# Patient Record
Sex: Male | Born: 1940 | Race: White | Hispanic: No | Marital: Married | Smoking: Former smoker
Health system: Southern US, Community
[De-identification: ages and names within clinical notes are randomized; demographics above are authoritative.]

## PROBLEM LIST (undated history)

## (undated) DIAGNOSIS — M069 Rheumatoid arthritis, unspecified: Secondary | ICD-10-CM

## (undated) DIAGNOSIS — I10 Essential (primary) hypertension: Secondary | ICD-10-CM

## (undated) DIAGNOSIS — E785 Hyperlipidemia, unspecified: Secondary | ICD-10-CM

## (undated) DIAGNOSIS — D649 Anemia, unspecified: Secondary | ICD-10-CM

## (undated) DIAGNOSIS — F419 Anxiety disorder, unspecified: Secondary | ICD-10-CM

## (undated) DIAGNOSIS — E039 Hypothyroidism, unspecified: Secondary | ICD-10-CM

## (undated) HISTORY — PX: CHOLECYSTECTOMY: SHX55

## (undated) HISTORY — PX: OTHER SURGICAL HISTORY: SHX169

---

## 2021-04-12 ENCOUNTER — Other Ambulatory Visit
Admission: RE | Admit: 2021-04-12 | Discharge: 2021-04-12 | Disposition: A | Discharge status: Home / Self Care | Payer: Medicare HMO | Source: Ambulatory Visit | Attending: Rheumatology | Admitting: Rheumatology

## 2021-04-12 DIAGNOSIS — M25461 Effusion, right knee: Secondary | ICD-10-CM | POA: Diagnosis present

## 2021-04-12 DIAGNOSIS — M170 Bilateral primary osteoarthritis of knee: Secondary | ICD-10-CM | POA: Diagnosis present

## 2021-04-12 LAB — SYNOVIAL CELL COUNT + DIFF, W/ CRYSTALS
Crystals, Fluid: NONE SEEN
Eosinophils-Synovial: 0 %
Lymphocytes-Synovial Fld: 21 %
Monocyte-Macrophage-Synovial Fluid: 44 %
Neutrophil, Synovial: 35 %
WBC, Synovial: 88 /mm3 (ref 0–200)

## 2021-06-12 ENCOUNTER — Encounter: Payer: Self-pay | Admitting: Internal Medicine

## 2021-06-12 ENCOUNTER — Inpatient Hospital Stay
Admission: EM | Admit: 2021-06-12 | Discharge: 2021-06-18 | DRG: 522 | Disposition: A | Discharge status: Skilled Nursing Facility | Payer: Medicare HMO | Attending: Family Medicine | Admitting: Family Medicine

## 2021-06-12 ENCOUNTER — Other Ambulatory Visit: Payer: Self-pay

## 2021-06-12 ENCOUNTER — Emergency Department: Payer: Medicare HMO

## 2021-06-12 DIAGNOSIS — Z972 Presence of dental prosthetic device (complete) (partial): Secondary | ICD-10-CM | POA: Diagnosis not present

## 2021-06-12 DIAGNOSIS — W109XXA Fall (on) (from) unspecified stairs and steps, initial encounter: Secondary | ICD-10-CM | POA: Diagnosis present

## 2021-06-12 DIAGNOSIS — F419 Anxiety disorder, unspecified: Secondary | ICD-10-CM | POA: Diagnosis present

## 2021-06-12 DIAGNOSIS — K219 Gastro-esophageal reflux disease without esophagitis: Secondary | ICD-10-CM | POA: Diagnosis present

## 2021-06-12 DIAGNOSIS — Z8673 Personal history of transient ischemic attack (TIA), and cerebral infarction without residual deficits: Secondary | ICD-10-CM | POA: Diagnosis not present

## 2021-06-12 DIAGNOSIS — Y92009 Unspecified place in unspecified non-institutional (private) residence as the place of occurrence of the external cause: Secondary | ICD-10-CM

## 2021-06-12 DIAGNOSIS — W19XXXA Unspecified fall, initial encounter: Secondary | ICD-10-CM | POA: Diagnosis not present

## 2021-06-12 DIAGNOSIS — Z87892 Personal history of anaphylaxis: Secondary | ICD-10-CM | POA: Diagnosis not present

## 2021-06-12 DIAGNOSIS — E440 Moderate protein-calorie malnutrition: Secondary | ICD-10-CM | POA: Diagnosis present

## 2021-06-12 DIAGNOSIS — S72001A Fracture of unspecified part of neck of right femur, initial encounter for closed fracture: Principal | ICD-10-CM | POA: Diagnosis present

## 2021-06-12 DIAGNOSIS — K582 Mixed irritable bowel syndrome: Secondary | ICD-10-CM | POA: Diagnosis present

## 2021-06-12 DIAGNOSIS — Z888 Allergy status to other drugs, medicaments and biological substances status: Secondary | ICD-10-CM | POA: Diagnosis not present

## 2021-06-12 DIAGNOSIS — M069 Rheumatoid arthritis, unspecified: Secondary | ICD-10-CM | POA: Diagnosis present

## 2021-06-12 DIAGNOSIS — E785 Hyperlipidemia, unspecified: Secondary | ICD-10-CM | POA: Diagnosis present

## 2021-06-12 DIAGNOSIS — Z881 Allergy status to other antibiotic agents status: Secondary | ICD-10-CM

## 2021-06-12 DIAGNOSIS — G250 Essential tremor: Secondary | ICD-10-CM | POA: Diagnosis present

## 2021-06-12 DIAGNOSIS — D649 Anemia, unspecified: Secondary | ICD-10-CM | POA: Diagnosis present

## 2021-06-12 DIAGNOSIS — F411 Generalized anxiety disorder: Secondary | ICD-10-CM | POA: Diagnosis present

## 2021-06-12 DIAGNOSIS — Z79899 Other long term (current) drug therapy: Secondary | ICD-10-CM | POA: Diagnosis not present

## 2021-06-12 DIAGNOSIS — I10 Essential (primary) hypertension: Secondary | ICD-10-CM | POA: Diagnosis present

## 2021-06-12 DIAGNOSIS — N400 Enlarged prostate without lower urinary tract symptoms: Secondary | ICD-10-CM | POA: Diagnosis present

## 2021-06-12 DIAGNOSIS — Z87891 Personal history of nicotine dependence: Secondary | ICD-10-CM | POA: Diagnosis not present

## 2021-06-12 DIAGNOSIS — Z8249 Family history of ischemic heart disease and other diseases of the circulatory system: Secondary | ICD-10-CM

## 2021-06-12 DIAGNOSIS — Z6823 Body mass index (BMI) 23.0-23.9, adult: Secondary | ICD-10-CM | POA: Diagnosis not present

## 2021-06-12 DIAGNOSIS — M62838 Other muscle spasm: Secondary | ICD-10-CM | POA: Diagnosis present

## 2021-06-12 DIAGNOSIS — E039 Hypothyroidism, unspecified: Secondary | ICD-10-CM | POA: Diagnosis present

## 2021-06-12 DIAGNOSIS — G459 Transient cerebral ischemic attack, unspecified: Secondary | ICD-10-CM | POA: Diagnosis present

## 2021-06-12 HISTORY — DX: Anxiety disorder, unspecified: F41.9

## 2021-06-12 HISTORY — DX: Essential (primary) hypertension: I10

## 2021-06-12 HISTORY — DX: Hypothyroidism, unspecified: E03.9

## 2021-06-12 HISTORY — DX: Rheumatoid arthritis, unspecified: M06.9

## 2021-06-12 HISTORY — DX: Anemia, unspecified: D64.9

## 2021-06-12 HISTORY — DX: Hyperlipidemia, unspecified: E78.5

## 2021-06-12 LAB — BASIC METABOLIC PANEL
Anion gap: 5 (ref 5–15)
BUN: 17 mg/dL (ref 8–23)
CO2: 29 mmol/L (ref 22–32)
Calcium: 8.7 mg/dL — ABNORMAL LOW (ref 8.9–10.3)
Chloride: 100 mmol/L (ref 98–111)
Creatinine, Ser: 0.98 mg/dL (ref 0.61–1.24)
GFR, Estimated: >60 mL/min (ref >60)
Glucose, Bld: 112 mg/dL — ABNORMAL HIGH (ref 70–99)
Potassium: 4.7 mmol/L (ref 3.5–5.1)
Sodium: 134 mmol/L — ABNORMAL LOW (ref 135–145)

## 2021-06-12 LAB — CBC WITH DIFFERENTIAL/PLATELET
Abs Immature Granulocytes: 0.04 10*3/uL (ref 0.00–0.07)
Basophils Absolute: 0 10*3/uL (ref 0.0–0.1)
Basophils Relative: 1 %
Eosinophils Absolute: 0.2 10*3/uL (ref 0.0–0.5)
Eosinophils Relative: 3 %
HCT: 33.3 % — ABNORMAL LOW (ref 39.0–52.0)
Hemoglobin: 10.6 g/dL — ABNORMAL LOW (ref 13.0–17.0)
Immature Granulocytes: 1 %
Lymphocytes Relative: 10 %
Lymphs Abs: 0.8 10*3/uL (ref 0.7–4.0)
MCH: 30.5 pg (ref 26.0–34.0)
MCHC: 31.8 g/dL (ref 30.0–36.0)
MCV: 95.7 fL (ref 80.0–100.0)
Monocytes Absolute: 0.9 10*3/uL (ref 0.1–1.0)
Monocytes Relative: 12 %
Neutro Abs: 5.7 10*3/uL (ref 1.7–7.7)
Neutrophils Relative %: 73 %
Platelets: 285 10*3/uL (ref 150–400)
RBC: 3.48 MIL/uL — ABNORMAL LOW (ref 4.22–5.81)
RDW: 14.3 % (ref 11.5–15.5)
WBC: 7.7 10*3/uL (ref 4.0–10.5)
nRBC: 0 % (ref 0.0–0.2)

## 2021-06-12 LAB — TYPE AND SCREEN
ABO/RH(D): O POS
Antibody Screen: NEGATIVE

## 2021-06-12 LAB — SURGICAL PCR SCREEN
MRSA, PCR: NEGATIVE
Staphylococcus aureus: NEGATIVE

## 2021-06-12 LAB — APTT: aPTT: 32 seconds (ref 24–36)

## 2021-06-12 LAB — PROTIME-INR
INR: 1.2 (ref 0.8–1.2)
Prothrombin Time: 15.4 seconds — ABNORMAL HIGH (ref 11.4–15.2)

## 2021-06-12 MED ORDER — LEVOTHYROXINE SODIUM 50 MCG PO TABS
100.0000 ug | ORAL_TABLET | Freq: Every day | ORAL | Status: DC
  Administered 2021-06-14 – 2021-06-18 (×5): 100 ug via ORAL
  Filled 2021-06-12 (×5): qty 2

## 2021-06-12 MED ORDER — ATORVASTATIN CALCIUM 20 MG PO TABS
20.0000 mg | ORAL_TABLET | Freq: Every day | ORAL | Status: DC
  Administered 2021-06-14 – 2021-06-18 (×5): 20 mg via ORAL
  Filled 2021-06-12 (×5): qty 1

## 2021-06-12 MED ORDER — PANTOPRAZOLE SODIUM 40 MG PO TBEC
40.0000 mg | DELAYED_RELEASE_TABLET | Freq: Every morning | ORAL | Status: DC
  Administered 2021-06-14 – 2021-06-18 (×5): 40 mg via ORAL
  Filled 2021-06-12 (×5): qty 1

## 2021-06-12 MED ORDER — AZELASTINE HCL 0.1 % NA SOLN
1.0000 | Freq: Two times a day (BID) | NASAL | Status: DC
Start: 2021-06-12 — End: 2021-06-18
  Administered 2021-06-12 – 2021-06-18 (×11): 1 via NASAL
  Filled 2021-06-12: qty 30

## 2021-06-12 MED ORDER — ONDANSETRON HCL 4 MG/2ML IJ SOLN: 4.0000 mg | Freq: Three times a day (TID) | INTRAMUSCULAR | Status: DC | PRN

## 2021-06-12 MED ORDER — HYDROMORPHONE HCL 1 MG/ML IJ SOLN
1.0000 mg | Freq: Once | INTRAMUSCULAR | Status: AC
  Administered 2021-06-12: 1 mg via INTRAVENOUS
  Filled 2021-06-12: qty 1

## 2021-06-12 MED ORDER — SODIUM CHLORIDE 0.9 % IV SOLN: INTRAVENOUS | Status: DC

## 2021-06-12 MED ORDER — METHOCARBAMOL 500 MG PO TABS: 500.0000 mg | ORAL_TABLET | Freq: Three times a day (TID) | ORAL | Status: DC | PRN

## 2021-06-12 MED ORDER — CLORAZEPATE DIPOTASSIUM 7.5 MG PO TABS: 7.5000 mg | ORAL_TABLET | Freq: Three times a day (TID) | ORAL | Status: DC

## 2021-06-12 MED ORDER — SENNOSIDES-DOCUSATE SODIUM 8.6-50 MG PO TABS: 1.0000 | ORAL_TABLET | Freq: Every evening | ORAL | Status: DC | PRN

## 2021-06-12 MED ORDER — GABAPENTIN 100 MG PO CAPS
100.0000 mg | ORAL_CAPSULE | Freq: Two times a day (BID) | ORAL | Status: DC
  Administered 2021-06-12 – 2021-06-18 (×11): 100 mg via ORAL
  Filled 2021-06-12 (×11): qty 1

## 2021-06-12 MED ORDER — MORPHINE SULFATE (PF) 2 MG/ML IV SOLN
1.0000 mg | INTRAVENOUS | Status: DC | PRN
  Administered 2021-06-12 – 2021-06-13 (×2): 1 mg via INTRAVENOUS
  Filled 2021-06-12 (×3): qty 1

## 2021-06-12 MED ORDER — FLUTICASONE PROPIONATE 50 MCG/ACT NA SUSP: 2.0000 | Freq: Every day | NASAL | Status: DC | PRN

## 2021-06-12 MED ORDER — OXYCODONE-ACETAMINOPHEN 5-325 MG PO TABS
1.0000 | ORAL_TABLET | ORAL | Status: DC | PRN
  Administered 2021-06-12: 1 via ORAL
  Filled 2021-06-12: qty 1

## 2021-06-12 MED ORDER — DIPHENOXYLATE-ATROPINE 2.5-0.025 MG PO TABS
2.0000 | ORAL_TABLET | Freq: Three times a day (TID) | ORAL | Status: DC | PRN
  Administered 2021-06-13: 1 via ORAL
  Filled 2021-06-12: qty 2

## 2021-06-12 MED ORDER — ACETAMINOPHEN 325 MG PO TABS: 650.0000 mg | ORAL_TABLET | Freq: Four times a day (QID) | ORAL | Status: DC | PRN

## 2021-06-12 MED ORDER — METHOTREXATE 2.5 MG PO TABS
12.5000 mg | ORAL_TABLET | ORAL | Status: DC
  Administered 2021-06-16: 12.5 mg via ORAL
  Filled 2021-06-12: qty 5

## 2021-06-12 MED ORDER — HYDRALAZINE HCL 20 MG/ML IJ SOLN
5.0000 mg | INTRAMUSCULAR | Status: DC | PRN
  Administered 2021-06-12: 5 mg via INTRAVENOUS
  Filled 2021-06-12: qty 1

## 2021-06-12 MED ORDER — FOLIC ACID 1 MG PO TABS
2.0000 mg | ORAL_TABLET | Freq: Every day | ORAL | Status: DC
  Administered 2021-06-14 – 2021-06-18 (×5): 2 mg via ORAL
  Filled 2021-06-12 (×4): qty 2

## 2021-06-12 MED ORDER — LISINOPRIL 5 MG PO TABS
5.0000 mg | ORAL_TABLET | Freq: Two times a day (BID) | ORAL | Status: DC
  Administered 2021-06-14 – 2021-06-17 (×4): 5 mg via ORAL
  Filled 2021-06-12 (×8): qty 1

## 2021-06-12 MED ORDER — ONDANSETRON HCL 4 MG/2ML IJ SOLN
4.0000 mg | Freq: Once | INTRAMUSCULAR | Status: AC
  Administered 2021-06-12: 4 mg via INTRAVENOUS
  Filled 2021-06-12: qty 2

## 2021-06-12 MED ORDER — CLOTRIMAZOLE-BETAMETHASONE 1-0.05 % EX CREA
1.0000 | TOPICAL_CREAM | Freq: Every day | CUTANEOUS | Status: DC
Start: 2021-06-12 — End: 2021-06-18
  Administered 2021-06-12 – 2021-06-18 (×6): 1 via TOPICAL
  Filled 2021-06-12: qty 15

## 2021-06-12 MED ORDER — CLONAZEPAM 0.5 MG PO TABS
0.5000 mg | ORAL_TABLET | Freq: Three times a day (TID) | ORAL | Status: DC
  Administered 2021-06-12 – 2021-06-18 (×15): 0.5 mg via ORAL
  Filled 2021-06-12 (×15): qty 1

## 2021-06-12 NOTE — H&P (Signed)
History and Physical    Mitchell Ayers ZOX:[id number] DOB: Oct 07, 1940 DOA: 06/12/2021  Referring MD/NP/PA:   PCP: Marlan Palau   Patient coming from:  The patient is coming from home.     Chief Complaint: Fall and right hip pain  HPI: Mitchell Ayers is a 81 y.o. male with medical history significant of hypertension, hyperlipidemia, TIA, GERD, hypothyroidism, anxiety, BPH, anemia, rheumatoid arthritis, essential tremor, who presents with fall and right hip pain.  Pt states that he was stepping down off a stepstool this afternoon, missing the last step and falling onto his right hip and ankle. Patient complains to pain to right hip and ankle.  His right hip pain is constant, sharp, severe, nonradiating.  No leg numbness.  Patient denies loss of consciousness.  No headache or neck pain.  Patient does not have chest pain, cough, shortness breath.  No fever or chills.  He states that he has history of IBS.  He has intermittent alternative constipation or diarrhea which is normal to him.  No nausea, vomiting or abdominal pain.   Data Reviewed and ED Course: pt was found to have WBC 7.7, electrolytes renal function okay, temperature normal, blood pressure 148/73, heart rate 81, RR 21, oxygen saturation 89-100% on room air.  CT of head and CT of C-spine negative for acute injury.  Right ankle x-ray negative for fracture.  X-ray of right hip showed femoral neck fracture.  Patient is admitted to MedSurg bed as inpatient.  Dr. Okey Dupre of Ortho is consulted.   EKG: I have personally reviewed.  Sinus rhythm, QTc 459, PVC, nonspecific T wave change.   Review of Systems:   General: no fevers, chills, no body weight gain, has fatigue HEENT: no blurry vision, hearing changes or sore throat Respiratory: no dyspnea, coughing, wheezing CV: no chest pain, no palpitations GI: no nausea, vomiting, abdominal pain, has alternative diarrhea, constipation GU: no dysuria, burning on urination, increased urinary  frequency, hematuria  Ext: no leg edema Neuro: no unilateral weakness, numbness, or tingling, no vision change or hearing loss.  Has full Skin: no rash, no skin tear. MSK: Has pain in right hip and right ankle  heme: No easy bruising.  Travel history: No recent long distant travel.   Allergy:  Allergies  Allergen Reactions   Tetracyclines & Related Anaphylaxis   Tamsulosin     Other reaction(s): Headache Left sided headache    Past Medical History:  Diagnosis Date   Anxiety    HLD (hyperlipidemia)    HTN (hypertension)    Hypothyroid    Normocytic anemia    RA (rheumatoid arthritis) (HCC)     Past Surgical History:  Procedure Laterality Date   CHOLECYSTECTOMY     Left knee surgery      Social History:  reports that he has quit smoking. His smoking use included cigarettes. He has never used smokeless tobacco. He reports that he does not currently use alcohol. He reports that he does not use drugs.  Family History:  Family History  Problem Relation Age of Onset   Hypertension Mother    Parkinson's disease Father    Stomach cancer Father      Prior to Admission medications   Not on File    Physical Exam: Vitals:   06/12/21 1630 06/12/21 1631 06/12/21 1814 06/12/21 1909  BP: (!) 174/94 (!) 174/94 (!) 145/74   Pulse: 96  (!) 103   Resp: 15  20   Temp:   99.4 F (37.4 C)  99.4 F (37.4 C)  TempSrc:   Oral   SpO2: 100%  92%   Weight:    66.3 kg  Height:     (1.676 m)   General: Not in acute distress HEENT:       Eyes: PERRL, EOMI, no scleral icterus.       ENT: No discharge from the ears and nose, no pharynx injection, no tonsillar enlargement.        Neck: No JVD, no bruit, no mass felt. Heme: No neck lymph node enlargement. Cardiac: S1/S2, RRR, No murmurs, No gallops or rubs. Respiratory: No rales, wheezing, rhonchi or rubs. GI: Soft, nondistended, nontender, no rebound pain, no organomegaly, BS present. GU: No hematuria Ext: No pitting leg edema  bilaterally. 1+DP/PT pulse bilaterally. Musculoskeletal: Has tenderness in the right hip Skin: No rashes.  Neuro: Alert, oriented X3, cranial nerves II-XII grossly intact, moves all extremities  Psych: Patient is not psychotic, no suicidal or hemocidal ideation.  Labs on Admission: I have personally reviewed following labs and imaging studies  CBC: Recent Labs  Lab 06/12/21 1323  WBC 7.7  NEUTROABS 5.7  HGB 10.6*  HCT 33.3*  MCV 95.7  PLT 285   Basic Metabolic Panel: Recent Labs  Lab 06/12/21 1323  NA 134*  K 4.7  CL 100  CO2 29  GLUCOSE 112*  BUN 17  CREATININE 0.98  CALCIUM 8.7*   GFR: Estimated Creatinine Clearance: 52.4 mL/min (by C-G formula based on SCr of 0.98 mg/dL). Liver Function Tests: No results for input(s): AST, ALT, ALKPHOS, BILITOT, PROT, ALBUMIN in the last 168 hours. No results for input(s): LIPASE, AMYLASE in the last 168 hours. No results for input(s): AMMONIA in the last 168 hours. Coagulation Profile: No results for input(s): INR, PROTIME in the last 168 hours. Cardiac Enzymes: No results for input(s): CKTOTAL, CKMB, CKMBINDEX, TROPONINI in the last 168 hours. BNP (last 3 results) No results for input(s): PROBNP in the last 8760 hours. HbA1C: No results for input(s): HGBA1C in the last 72 hours. CBG: No results for input(s): GLUCAP in the last 168 hours. Lipid Profile: No results for input(s): CHOL, HDL, LDLCALC, TRIG, CHOLHDL, LDLDIRECT in the last 72 hours. Thyroid Function Tests: No results for input(s): TSH, T4TOTAL, FREET4, T3FREE, THYROIDAB in the last 72 hours. Anemia Panel: No results for input(s): VITAMINB12, FOLATE, FERRITIN, TIBC, IRON, RETICCTPCT in the last 72 hours. Urine analysis: No results found for: COLORURINE, APPEARANCEUR, LABSPEC, PHURINE, GLUCOSEU, HGBUR, BILIRUBINUR, KETONESUR, PROTEINUR, UROBILINOGEN, NITRITE, LEUKOCYTESUR Sepsis Labs: (procalcitonin:4,lacticidven:4) )No results found for this or any  previous visit (from the past 240 hour(s)).   Radiological Exams on Admission: DG Chest 1 View  Result Date: 06/12/2021 CLINICAL DATA:  Fall. EXAM: CHEST  1 VIEW COMPARISON:  None Available. FINDINGS: The lungs are clear without focal pneumonia, edema, pneumothorax or pleural effusion. Interstitial markings are diffusely coarsened with chronic features. The cardiopericardial silhouette is within normal limits for size. Bones are diffusely demineralized. Telemetry leads overlie the chest. IMPRESSION: No active disease. Electronically Signed   By: Kennith Center M.D.   On: 06/12/2021 14:06   DG Ankle Complete Right  Result Date: 06/12/2021 CLINICAL DATA:  Patient missed a step and injured right ankle. Pain. EXAM: RIGHT ANKLE - COMPLETE 3+ VIEW COMPARISON:  None Available. FINDINGS: There is no evidence of fracture, dislocation, or joint effusion. There is no evidence of arthropathy or other focal bone abnormality. Soft tissues are unremarkable. IMPRESSION: Negative. Electronically Signed   By: Minerva Areola  Molli PoseyMansell M.D.   On: 06/12/2021 14:05   CT Head Wo Contrast  Result Date: 06/12/2021 CLINICAL DATA:  Moderate to severe head trauma EXAM: CT HEAD WITHOUT CONTRAST CT CERVICAL SPINE WITHOUT CONTRAST TECHNIQUE: Multidetector CT imaging of the head and cervical spine was performed following the standard protocol without intravenous contrast. Multiplanar CT image reconstructions of the cervical spine were also generated. RADIATION DOSE REDUCTION: This exam was performed according to the departmental dose-optimization program which includes automated exposure control, adjustment of the mA and/or kV according to patient size and/or use of iterative reconstruction technique. COMPARISON:  None Available. FINDINGS: CT HEAD FINDINGS Brain: No acute intracranial hemorrhage. No focal mass lesion. No CT evidence of acute infarction. No midline shift or mass effect. No hydrocephalus. Basilar cisterns are patent. There are  periventricular and subcortical white matter hypodensities. Generalized cortical atrophy. Vascular: No hyperdense vessel or unexpected calcification. Skull: Normal. Negative for fracture or focal lesion. Sinuses/Orbits: Paranasal sinuses and mastoid air cells are clear. Orbits are clear. Other: None. CT CERVICAL SPINE FINDINGS Alignment: Normal alignment of the cervical vertebral bodies. Skull base and vertebrae: Normal craniocervical junction. No loss of vertebral body height or disc height. Normal facet articulation. No evidence of fracture. Soft tissues and spinal canal: No prevertebral soft tissue swelling. No perispinal or epidural hematoma. Disc levels: Endplates spurring and joint space narrowing at C5-C6 and C6-C7. No acute findings. Upper chest: Clear Other: Nuchal calcifications posterior to C6. IMPRESSION: 1. No acute intracranial findings. 2. No cervical spine fracture. 3. Multilevel disc osteophytic disease. Electronically Signed   By: Genevive BiStewart  Edmunds M.D.   On: 06/12/2021 13:53   CT Cervical Spine Wo Contrast  Result Date: 06/12/2021 CLINICAL DATA:  Moderate to severe head trauma EXAM: CT HEAD WITHOUT CONTRAST CT CERVICAL SPINE WITHOUT CONTRAST TECHNIQUE: Multidetector CT imaging of the head and cervical spine was performed following the standard protocol without intravenous contrast. Multiplanar CT image reconstructions of the cervical spine were also generated. RADIATION DOSE REDUCTION: This exam was performed according to the departmental dose-optimization program which includes automated exposure control, adjustment of the mA and/or kV according to patient size and/or use of iterative reconstruction technique. COMPARISON:  None Available. FINDINGS: CT HEAD FINDINGS Brain: No acute intracranial hemorrhage. No focal mass lesion. No CT evidence of acute infarction. No midline shift or mass effect. No hydrocephalus. Basilar cisterns are patent. There are periventricular and subcortical white matter  hypodensities. Generalized cortical atrophy. Vascular: No hyperdense vessel or unexpected calcification. Skull: Normal. Negative for fracture or focal lesion. Sinuses/Orbits: Paranasal sinuses and mastoid air cells are clear. Orbits are clear. Other: None. CT CERVICAL SPINE FINDINGS Alignment: Normal alignment of the cervical vertebral bodies. Skull base and vertebrae: Normal craniocervical junction. No loss of vertebral body height or disc height. Normal facet articulation. No evidence of fracture. Soft tissues and spinal canal: No prevertebral soft tissue swelling. No perispinal or epidural hematoma. Disc levels: Endplates spurring and joint space narrowing at C5-C6 and C6-C7. No acute findings. Upper chest: Clear Other: Nuchal calcifications posterior to C6. IMPRESSION: 1. No acute intracranial findings. 2. No cervical spine fracture. 3. Multilevel disc osteophytic disease. Electronically Signed   By: Genevive BiStewart  Edmunds M.D.   On: 06/12/2021 13:53   DG Hip Unilat W or Wo Pelvis 2-3 Views Right  Result Date: 06/12/2021 CLINICAL DATA:  Missed a step.  Fall. EXAM: DG HIP (WITH OR WITHOUT PELVIS) 2-3V RIGHT COMPARISON:  None Available. FINDINGS: Three view study. Right femoral neck fracture noted with varus  angulation. SI joints and pubis symphysis unremarkable. No evidence for superior or inferior pubic ramus fracture. No worrisome lytic or sclerotic osseous abnormality. IMPRESSION: Right femoral neck fracture with varus angulation. Electronically Signed   By: Kennith Center M.D.   On: 06/12/2021 14:06      Assessment/Plan Principal Problem:   Fracture of femoral neck, right, closed (HCC) Active Problems:   Fall at home, initial encounter   HTN (hypertension)   HLD (hyperlipidemia)   Hypothyroid   Anxiety   Normocytic anemia   RA (rheumatoid arthritis) (HCC)   TIA (transient ischemic attack)     Assessment and Plan: * Fracture of femoral neck, right, closed (HCC) As evidenced by x-ray. Patient  has moderate to severe pain now. No neurovascular compromise. Orthopedic surgeon, Dr. Okey Dupre was consulted.   -Admitted to MedSurg bed as inpatient -As needed morphine, Percocet, Tylenol for pain - When necessary Zofran for nausea - Robaxin for muscle spasm - type and cross - INR/PTT - PT/OT when able to (not ordered now)  Fall at home, initial encounter - PT/OT when able to  TIA (transient ischemic attack) - Lipitor  RA (rheumatoid arthritis) (HCC) - Continue home methotrexate  Normocytic anemia Hemoglobin stable, 10.6 -Follow-up with CBC  Anxiety - As needed Klonopin  Hypothyroid - Synthroid  HLD (hyperlipidemia) - Lipitor  HTN (hypertension) - IV hydralazine as needed -Lisinopril        DVT ppx: SCD  Code Status: Full code  Family Communication:     Yes, patient's daughter   at bed side.      Disposition Plan:  Anticipate discharge back to previous environment  Consults called: Dr. Okey Dupre of Ortho  Admission status and Level of care: Med-Surg:    as inpt       Severity of Illness:  The appropriate patient status for this patient is INPATIENT. Inpatient status is judged to be reasonable and necessary in order to provide the required intensity of service to ensure the patient's safety. The patient's presenting symptoms, physical exam findings, and initial radiographic and laboratory data in the context of their chronic comorbidities is felt to place them at high risk for further clinical deterioration. Furthermore, it is not anticipated that the patient will be medically stable for discharge from the hospital within 2 midnights of admission.   * I certify that at the point of admission it is my clinical judgment that the patient will require inpatient hospital care spanning beyond 2 midnights from the point of admission due to high intensity of service, high risk for further deterioration and high frequency of surveillance required.*       Date  of Service 06/12/2021    Lorretta Harp [facility]   If 7PM-7AM, please contact night-coverage [email or web address] 06/12/2021, 7:21 PM

## 2021-06-12 NOTE — Plan of Care (Signed)
  Problem: Education: Goal: Verbalization of understanding the information provided (i.e., activity precautions, restrictions, etc) will improve Outcome: Progressing Goal: Individualized Educational Video(s) Outcome: Progressing   Problem: Activity: Goal: Ability to ambulate and perform ADLs will improve Outcome: Progressing   Problem: Pain Management: Goal: Pain level will decrease Outcome: Progressing

## 2021-06-12 NOTE — ED Provider Notes (Signed)
[facility] Provider Note    Event Date/Time   First MD Initiated Contact with Patient 06/12/21 1307     (approximate)   History   Chief Complaint Fall and Hip Pain   HPI Mitchell Ayers is a 81 y.o. male, history of TIAs, hypothyroidism, hypertension, GERD, rheumatoid arthritis, B12 deficiency, presents to the emergency department for evaluation of injury sustained from mechanical fall.  He states that he was stepping down off of a stepstool this afternoon when he missed the last step and fell onto his right side, injuring his right hip and ankle.  He states that he was able to pull himself up to a chair after falling, however is unable to walk.  He states that he is unsure if he hit his head or not, though is endorsing some neck pain.  Denies LOC or blood thinner usage.  Denies chest pain, shortness of breath, abdominal pain, flank pain, nausea/vomiting, fever/chills, recent illnesses, numbness/tingling upper or lower extremities, or dizziness/lightheadedness.  History Limitations: No limitations.        Physical Exam  Triage Vital Signs: ED Triage Vitals  Enc Vitals Group     BP      Pulse      Resp      Temp      Temp src      SpO2      Weight      Height      Head Circumference      Peak Flow      Pain Score      Pain Loc      Pain Edu?      Excl. in GC?     Most recent vital signs: Vitals:   06/12/21 1814 06/12/21 1909  BP: (!) 145/74   Pulse: (!) 103   Resp: 20   Temp: 99.4 F (37.4 C) 99.4 F (37.4 C)  SpO2: 92%     General: Awake, appears uncomfortable Skin: Warm, dry. No rashes or lesions.  Eyes: PERRL. Conjunctivae normal.  CV: Good peripheral perfusion.  Resp: Normal effort.  Abd: Soft, non-tender. No distention.  Neuro: At baseline. No gross neurological deficits.   Focused Exam: Right lower extremity is shortened and internally rotated.  No other obvious gross deformities.  Mild swelling appreciated in the right ankle  joint diffusely.  Strong pulses distally.  Sensation and baseline motor function intact.  Mild abrasions present on right upper extremity and left upper extremity, though no other injuries or deformities present in the other extremities     Physical Exam    ED Results / Procedures / Treatments  Labs (all labs ordered are listed, but only abnormal results are displayed) Labs Reviewed  BASIC METABOLIC PANEL - Abnormal; Notable for the following components:      Result Value   Sodium 134 (*)    Glucose, Bld 112 (*)    Calcium 8.7 (*)    All other components within normal limits  CBC WITH DIFFERENTIAL/PLATELET - Abnormal; Notable for the following components:   RBC 3.48 (*)    Hemoglobin 10.6 (*)    HCT 33.3 (*)    All other components within normal limits  SURGICAL PCR SCREEN  PROTIME-INR  APTT  CBC  TYPE AND SCREEN     EKG Sinus rhythm, rate 75, intermittent PVCs, no significant ST segment changes, no AV blocks, no QT prolongation.   RADIOLOGY  ED Provider Interpretation: I personally viewed and interpreted these images.  Chest x-ray negative.  Right ankle x-ray negative.  CT head unremarkable.  Cervical spine CT negative.  Right hip x-ray shows right femoral neck fracture with varus angulation.  DG Chest 1 View  Result Date: 06/12/2021 CLINICAL DATA:  Fall. EXAM: CHEST  1 VIEW COMPARISON:  None Available. FINDINGS: The lungs are clear without focal pneumonia, edema, pneumothorax or pleural effusion. Interstitial markings are diffusely coarsened with chronic features. The cardiopericardial silhouette is within normal limits for size. Bones are diffusely demineralized. Telemetry leads overlie the chest. IMPRESSION: No active disease. Electronically Signed   By: Kennith Center M.D.   On: 06/12/2021 14:06   DG Ankle Complete Right  Result Date: 06/12/2021 CLINICAL DATA:  Patient missed a step and injured right ankle. Pain. EXAM: RIGHT ANKLE - COMPLETE 3+ VIEW COMPARISON:  None  Available. FINDINGS: There is no evidence of fracture, dislocation, or joint effusion. There is no evidence of arthropathy or other focal bone abnormality. Soft tissues are unremarkable. IMPRESSION: Negative. Electronically Signed   By: Kennith Center M.D.   On: 06/12/2021 14:05   CT Head Wo Contrast  Result Date: 06/12/2021 CLINICAL DATA:  Moderate to severe head trauma EXAM: CT HEAD WITHOUT CONTRAST CT CERVICAL SPINE WITHOUT CONTRAST TECHNIQUE: Multidetector CT imaging of the head and cervical spine was performed following the standard protocol without intravenous contrast. Multiplanar CT image reconstructions of the cervical spine were also generated. RADIATION DOSE REDUCTION: This exam was performed according to the departmental dose-optimization program which includes automated exposure control, adjustment of the mA and/or kV according to patient size and/or use of iterative reconstruction technique. COMPARISON:  None Available. FINDINGS: CT HEAD FINDINGS Brain: No acute intracranial hemorrhage. No focal mass lesion. No CT evidence of acute infarction. No midline shift or mass effect. No hydrocephalus. Basilar cisterns are patent. There are periventricular and subcortical white matter hypodensities. Generalized cortical atrophy. Vascular: No hyperdense vessel or unexpected calcification. Skull: Normal. Negative for fracture or focal lesion. Sinuses/Orbits: Paranasal sinuses and mastoid air cells are clear. Orbits are clear. Other: None. CT CERVICAL SPINE FINDINGS Alignment: Normal alignment of the cervical vertebral bodies. Skull base and vertebrae: Normal craniocervical junction. No loss of vertebral body height or disc height. Normal facet articulation. No evidence of fracture. Soft tissues and spinal canal: No prevertebral soft tissue swelling. No perispinal or epidural hematoma. Disc levels: Endplates spurring and joint space narrowing at C5-C6 and C6-C7. No acute findings. Upper chest: Clear Other:  Nuchal calcifications posterior to C6. IMPRESSION: 1. No acute intracranial findings. 2. No cervical spine fracture. 3. Multilevel disc osteophytic disease. Electronically Signed   By: Genevive Bi M.D.   On: 06/12/2021 13:53   CT Cervical Spine Wo Contrast  Result Date: 06/12/2021 CLINICAL DATA:  Moderate to severe head trauma EXAM: CT HEAD WITHOUT CONTRAST CT CERVICAL SPINE WITHOUT CONTRAST TECHNIQUE: Multidetector CT imaging of the head and cervical spine was performed following the standard protocol without intravenous contrast. Multiplanar CT image reconstructions of the cervical spine were also generated. RADIATION DOSE REDUCTION: This exam was performed according to the departmental dose-optimization program which includes automated exposure control, adjustment of the mA and/or kV according to patient size and/or use of iterative reconstruction technique. COMPARISON:  None Available. FINDINGS: CT HEAD FINDINGS Brain: No acute intracranial hemorrhage. No focal mass lesion. No CT evidence of acute infarction. No midline shift or mass effect. No hydrocephalus. Basilar cisterns are patent. There are periventricular and subcortical white matter hypodensities. Generalized cortical atrophy. Vascular: No hyperdense vessel or  unexpected calcification. Skull: Normal. Negative for fracture or focal lesion. Sinuses/Orbits: Paranasal sinuses and mastoid air cells are clear. Orbits are clear. Other: None. CT CERVICAL SPINE FINDINGS Alignment: Normal alignment of the cervical vertebral bodies. Skull base and vertebrae: Normal craniocervical junction. No loss of vertebral body height or disc height. Normal facet articulation. No evidence of fracture. Soft tissues and spinal canal: No prevertebral soft tissue swelling. No perispinal or epidural hematoma. Disc levels: Endplates spurring and joint space narrowing at C5-C6 and C6-C7. No acute findings. Upper chest: Clear Other: Nuchal calcifications posterior to C6.  IMPRESSION: 1. No acute intracranial findings. 2. No cervical spine fracture. 3. Multilevel disc osteophytic disease. Electronically Signed   By: Genevive Bi M.D.   On: 06/12/2021 13:53   DG Hip Unilat W or Wo Pelvis 2-3 Views Right  Result Date: 06/12/2021 CLINICAL DATA:  Missed a step.  Fall. EXAM: DG HIP (WITH OR WITHOUT PELVIS) 2-3V RIGHT COMPARISON:  None Available. FINDINGS: Three view study. Right femoral neck fracture noted with varus angulation. SI joints and pubis symphysis unremarkable. No evidence for superior or inferior pubic ramus fracture. No worrisome lytic or sclerotic osseous abnormality. IMPRESSION: Right femoral neck fracture with varus angulation. Electronically Signed   By: Kennith Center M.D.   On: 06/12/2021 14:06    PROCEDURES:  Critical Care performed: N/A.  Procedures    MEDICATIONS ORDERED IN ED: Medications  morphine (PF) 2 MG/ML injection 1 mg (1 mg Intravenous Given 06/12/21 1821)  oxyCODONE-acetaminophen (PERCOCET/ROXICET) 5-325 MG per tablet 1 tablet (has no administration in time range)  acetaminophen (TYLENOL) tablet 650 mg (has no administration in time range)  methocarbamol (ROBAXIN) tablet 500 mg (has no administration in time range)  ondansetron (ZOFRAN) injection 4 mg (has no administration in time range)  hydrALAZINE (APRESOLINE) injection 5 mg (5 mg Intravenous Given 06/12/21 1631)  0.9 %  sodium chloride infusion ( Intravenous New Bag/Given 06/12/21 1630)  senna-docusate (Senokot-S) tablet 1 tablet (has no administration in time range)  methotrexate (RHEUMATREX) tablet 12.5 mg (has no administration in time range)  atorvastatin (LIPITOR) tablet 20 mg (20 mg Oral Not Given 06/12/21 1905)  lisinopril (ZESTRIL) tablet 5 mg (has no administration in time range)  levothyroxine (SYNTHROID) tablet 100 mcg (has no administration in time range)  diphenoxylate-atropine (LOMOTIL) 2.5-0.025 MG per tablet 2 tablet (has no administration in time range)   pantoprazole (PROTONIX) EC tablet 40 mg (has no administration in time range)  folic acid (FOLVITE) tablet 2 mg (has no administration in time range)  gabapentin (NEURONTIN) capsule 100 mg (has no administration in time range)  azelastine (ASTELIN) 0.1 % nasal spray 1 spray (has no administration in time range)  fluticasone (FLONASE) 50 MCG/ACT nasal spray 2 spray (has no administration in time range)  clotrimazole-betamethasone (LOTRISONE) cream 1 application. (has no administration in time range)  clonazePAM (KLONOPIN) tablet 0.5 mg (has no administration in time range)  HYDROmorphone (DILAUDID) injection 1 mg (1 mg Intravenous Given 06/12/21 1323)  ondansetron (ZOFRAN) injection 4 mg (4 mg Intravenous Given 06/12/21 1414)  HYDROmorphone (DILAUDID) injection 1 mg (1 mg Intravenous Given 06/12/21 1414)     IMPRESSION / MDM / ASSESSMENT AND PLAN / ED COURSE  I reviewed the triage vital signs and the nursing notes.                              Differential diagnosis includes, but is not limited to, hip fracture,  hip dislocation, medial/lateral malleolus fracture, epidural/subdural hematoma, cervical spine fracture, cervical strain.   ED Course Patient appears to be in pain, but stable.  We will go ahead treat with 1 mg hydromorphone.  CBC shows no leukocytosis.  Mild anemia present at 10.6.  BMP shows no significant electrolyte abnormalities or AKI.  Assessment/Plan Patient presents with injuries from mechanical fall.  Found to have right-sided femoral neck fracture with varus angulation on x-ray.  Neurovascularly intact.  Head/neck CT negative.  Chest x-ray negative.  Ankle x-ray negative.  Spoke with the on-call orthopedic surgeon, Dr. Okey Dupre, who advised that he would follow-up with the patient after admitted.  Spoke with the on-call hospitalist, who agreed to admission.  Patient's presentation is most consistent with acute complicated illness / injury requiring diagnostic workup.         FINAL CLINICAL IMPRESSION(S) / ED DIAGNOSES   Final diagnoses:  Fall, initial encounter  Closed fracture of neck of right femur, initial encounter (HCC)     Rx / DC Orders   ED Discharge Orders     None        Note:  This document was prepared using Dragon voice recognition software and may include unintentional dictation errors.   Varney Daily, [place] 06/12/21 Willette Alma    Sharyn Creamer, MD 06/17/21 [phone]

## 2021-06-12 NOTE — Assessment & Plan Note (Signed)
-   As needed Klonopin

## 2021-06-12 NOTE — Assessment & Plan Note (Signed)
Lipitor 

## 2021-06-12 NOTE — Assessment & Plan Note (Signed)
-  Continue home methotrexate. 

## 2021-06-12 NOTE — Assessment & Plan Note (Signed)
-   PT/OT when able to 

## 2021-06-12 NOTE — Assessment & Plan Note (Signed)
Synthroid 

## 2021-06-12 NOTE — Assessment & Plan Note (Signed)
Hemoglobin stable, 10.6 -Follow-up with CBC

## 2021-06-12 NOTE — ED Notes (Signed)
Patient provided with mouth swabs. 

## 2021-06-12 NOTE — Assessment & Plan Note (Signed)
-   IV hydralazine as needed -Lisinopril 

## 2021-06-12 NOTE — Assessment & Plan Note (Addendum)
As evidenced by x-ray. Patient has moderate to severe pain now. No neurovascular compromise. Orthopedic surgeon, Dr. Sharlet Salina was consulted.   -Admitted to MedSurg bed as inpatient -As needed morphine, Percocet, Tylenol for pain - When necessary Zofran for nausea - Robaxin for muscle spasm - type and cross - INR/PTT - PT/OT when able to (not ordered now)

## 2021-06-12 NOTE — ED Triage Notes (Signed)
Patient to ER via [facility] from home. Reports stepping down off a stepstool this afternoon, missing the last step and falling onto his right hip and ankle. Patient complains to pain present to both hip and ankle. Swelling present to ankle, shortening present to extremity. Patient was able to pull himself up to a chair after falling. Possibly hit head. Denies LOC/ blood thinner usage.   Ems VSS- BP 150/90, O2 sats 97% RA, Hr 87.

## 2021-06-13 ENCOUNTER — Inpatient Hospital Stay: Payer: Medicare HMO | Admitting: Anesthesiology

## 2021-06-13 ENCOUNTER — Encounter: Payer: Self-pay | Admitting: Internal Medicine

## 2021-06-13 ENCOUNTER — Other Ambulatory Visit: Payer: Self-pay

## 2021-06-13 ENCOUNTER — Inpatient Hospital Stay: Payer: Medicare HMO

## 2021-06-13 ENCOUNTER — Encounter
Admission: EM | Disposition: A | Discharge status: Skilled Nursing Facility | Payer: Self-pay | Source: Home / Self Care | Attending: Family Medicine

## 2021-06-13 DIAGNOSIS — S72001A Fracture of unspecified part of neck of right femur, initial encounter for closed fracture: Secondary | ICD-10-CM | POA: Diagnosis not present

## 2021-06-13 DIAGNOSIS — E440 Moderate protein-calorie malnutrition: Secondary | ICD-10-CM | POA: Insufficient documentation

## 2021-06-13 HISTORY — PX: HIP ARTHROPLASTY: SHX981

## 2021-06-13 LAB — CBC
HCT: 30.2 % — ABNORMAL LOW (ref 39.0–52.0)
Hemoglobin: 9.5 g/dL — ABNORMAL LOW (ref 13.0–17.0)
MCH: 30.4 pg (ref 26.0–34.0)
MCHC: 31.5 g/dL (ref 30.0–36.0)
MCV: 96.5 fL (ref 80.0–100.0)
Platelets: 244 10*3/uL (ref 150–400)
RBC: 3.13 MIL/uL — ABNORMAL LOW (ref 4.22–5.81)
RDW: 14.5 % (ref 11.5–15.5)
WBC: 10.4 10*3/uL (ref 4.0–10.5)
nRBC: 0 % (ref 0.0–0.2)

## 2021-06-13 SURGERY — HEMIARTHROPLASTY, HIP, DIRECT ANTERIOR APPROACH, FOR FRACTURE
Anesthesia: General | Site: Hip | Laterality: Right

## 2021-06-13 MED ORDER — MORPHINE SULFATE (PF) 2 MG/ML IV SOLN: 0.5000 mg | INTRAVENOUS | Status: DC | PRN

## 2021-06-13 MED ORDER — ENSURE ENLIVE PO LIQD
237.0000 mL | Freq: Three times a day (TID) | ORAL | Status: DC
  Administered 2021-06-14 – 2021-06-18 (×10): 237 mL via ORAL
  Filled 2021-06-13 (×5): qty 237

## 2021-06-13 MED ORDER — PHENOL 1.4 % MT LIQD: 1.0000 | OROMUCOSAL | Status: DC | PRN

## 2021-06-13 MED ORDER — LACTATED RINGERS IV SOLN: INTRAVENOUS | Status: DC

## 2021-06-13 MED ORDER — CHLORHEXIDINE GLUCONATE CLOTH 2 % EX PADS
6.0000 | MEDICATED_PAD | Freq: Every day | CUTANEOUS | Status: DC
  Administered 2021-06-13 – 2021-06-18 (×5): 6 via TOPICAL

## 2021-06-13 MED ORDER — KETOTIFEN FUMARATE 0.025 % OP SOLN
1.0000 [drp] | Freq: Two times a day (BID) | OPHTHALMIC | Status: DC
  Administered 2021-06-13 – 2021-06-16 (×7): 1 [drp] via OPHTHALMIC
  Filled 2021-06-13: qty 5

## 2021-06-13 MED ORDER — TRANEXAMIC ACID 1000 MG/10ML IV SOLN
INTRAVENOUS | Status: AC
  Filled 2021-06-13: qty 10

## 2021-06-13 MED ORDER — ACETAMINOPHEN 10 MG/ML IV SOLN: 1000.0000 mg | Freq: Once | INTRAVENOUS | Status: DC | PRN

## 2021-06-13 MED ORDER — FENTANYL CITRATE (PF) 100 MCG/2ML IJ SOLN
INTRAMUSCULAR | Status: AC
  Filled 2021-06-13: qty 2

## 2021-06-13 MED ORDER — PHENYLEPHRINE 80 MCG/ML (10ML) SYRINGE FOR IV PUSH (FOR BLOOD PRESSURE SUPPORT)
PREFILLED_SYRINGE | INTRAVENOUS | Status: DC | PRN
  Administered 2021-06-13: 80 ug via INTRAVENOUS

## 2021-06-13 MED ORDER — VANCOMYCIN HCL 1000 MG IV SOLR
INTRAVENOUS | Status: AC
  Filled 2021-06-13: qty 20

## 2021-06-13 MED ORDER — FENTANYL CITRATE (PF) 100 MCG/2ML IJ SOLN: 25.0000 ug | INTRAMUSCULAR | Status: DC | PRN

## 2021-06-13 MED ORDER — ACETAMINOPHEN 10 MG/ML IV SOLN
INTRAVENOUS | Status: DC | PRN
  Administered 2021-06-13: 1000 mg via INTRAVENOUS

## 2021-06-13 MED ORDER — KETAMINE HCL 10 MG/ML IJ SOLN
INTRAMUSCULAR | Status: DC | PRN
  Administered 2021-06-13: 30 mg via INTRAVENOUS

## 2021-06-13 MED ORDER — TRANEXAMIC ACID-NACL 1000-0.7 MG/100ML-% IV SOLN
INTRAVENOUS | Status: DC | PRN
  Administered 2021-06-13 (×2): 1000 mg via INTRAVENOUS

## 2021-06-13 MED ORDER — METOCLOPRAMIDE HCL 5 MG PO TABS: 5.0000 mg | ORAL_TABLET | Freq: Three times a day (TID) | ORAL | Status: DC | PRN

## 2021-06-13 MED ORDER — MENTHOL 3 MG MT LOZG: 1.0000 | LOZENGE | OROMUCOSAL | Status: DC | PRN

## 2021-06-13 MED ORDER — ONDANSETRON HCL 4 MG/2ML IJ SOLN: 4.0000 mg | Freq: Once | INTRAMUSCULAR | Status: DC | PRN

## 2021-06-13 MED ORDER — DOCUSATE SODIUM 100 MG PO CAPS
100.0000 mg | ORAL_CAPSULE | Freq: Two times a day (BID) | ORAL | Status: DC
Start: 2021-06-13 — End: 2021-06-18
  Administered 2021-06-14 – 2021-06-18 (×9): 100 mg via ORAL
  Filled 2021-06-13 (×10): qty 1

## 2021-06-13 MED ORDER — OXYCODONE HCL 5 MG PO TABS
5.0000 mg | ORAL_TABLET | Freq: Once | ORAL | Status: AC | PRN
  Administered 2021-06-13: 5 mg via ORAL

## 2021-06-13 MED ORDER — PROPOFOL 10 MG/ML IV BOLUS
INTRAVENOUS | Status: DC | PRN
  Administered 2021-06-13: 10 mg via INTRAVENOUS

## 2021-06-13 MED ORDER — EPHEDRINE SULFATE (PRESSORS) 50 MG/ML IJ SOLN
INTRAMUSCULAR | Status: DC | PRN
  Administered 2021-06-13: 5 mg via INTRAVENOUS
  Administered 2021-06-13: 10 mg via INTRAVENOUS

## 2021-06-13 MED ORDER — ACETAMINOPHEN 10 MG/ML IV SOLN
INTRAVENOUS | Status: AC
  Filled 2021-06-13: qty 100

## 2021-06-13 MED ORDER — ADULT MULTIVITAMIN W/MINERALS CH
1.0000 | ORAL_TABLET | Freq: Every day | ORAL | Status: DC
Start: 2021-06-14 — End: 2021-06-18
  Administered 2021-06-14 – 2021-06-18 (×5): 1 via ORAL
  Filled 2021-06-13 (×6): qty 1

## 2021-06-13 MED ORDER — ACETAMINOPHEN 500 MG PO TABS
500.0000 mg | ORAL_TABLET | Freq: Four times a day (QID) | ORAL | Status: AC
  Administered 2021-06-13 – 2021-06-14 (×4): 500 mg via ORAL
  Filled 2021-06-13 (×4): qty 1

## 2021-06-13 MED ORDER — EPHEDRINE 5 MG/ML INJ
INTRAVENOUS | Status: AC
  Filled 2021-06-13: qty 5

## 2021-06-13 MED ORDER — HYDROCODONE-ACETAMINOPHEN 7.5-325 MG PO TABS: 1.0000 | ORAL_TABLET | ORAL | Status: DC | PRN

## 2021-06-13 MED ORDER — CEFAZOLIN SODIUM-DEXTROSE 2-4 GM/100ML-% IV SOLN
2.0000 g | INTRAVENOUS | Status: AC
  Administered 2021-06-13: 2 g via INTRAVENOUS

## 2021-06-13 MED ORDER — PROPOFOL 500 MG/50ML IV EMUL
INTRAVENOUS | Status: DC | PRN
  Administered 2021-06-13: 85 ug/kg/min via INTRAVENOUS

## 2021-06-13 MED ORDER — NEOMYCIN-POLYMYXIN B GU 40-200000 IR SOLN
Status: AC
  Filled 2021-06-13: qty 20

## 2021-06-13 MED ORDER — ENOXAPARIN SODIUM 40 MG/0.4ML IJ SOSY
40.0000 mg | PREFILLED_SYRINGE | INTRAMUSCULAR | Status: DC
  Administered 2021-06-14 – 2021-06-18 (×5): 40 mg via SUBCUTANEOUS
  Filled 2021-06-13 (×4): qty 0.4

## 2021-06-13 MED ORDER — NEOMYCIN-POLYMYXIN B GU 40-200000 IR SOLN
Status: DC | PRN
  Administered 2021-06-13: 12 mL

## 2021-06-13 MED ORDER — METOCLOPRAMIDE HCL 5 MG/ML IJ SOLN: 5.0000 mg | Freq: Three times a day (TID) | INTRAMUSCULAR | Status: DC | PRN

## 2021-06-13 MED ORDER — TRANEXAMIC ACID-NACL 1000-0.7 MG/100ML-% IV SOLN: 1000.0000 mg | Freq: Once | INTRAVENOUS | Status: DC

## 2021-06-13 MED ORDER — CEFAZOLIN SODIUM-DEXTROSE 2-4 GM/100ML-% IV SOLN
INTRAVENOUS | Status: AC
  Filled 2021-06-13: qty 100

## 2021-06-13 MED ORDER — PROPOFOL 1000 MG/100ML IV EMUL
INTRAVENOUS | Status: AC
  Filled 2021-06-13: qty 100

## 2021-06-13 MED ORDER — FENTANYL CITRATE (PF) 100 MCG/2ML IJ SOLN
INTRAMUSCULAR | Status: DC | PRN
  Administered 2021-06-13: 25 ug via INTRAVENOUS

## 2021-06-13 MED ORDER — BUPIVACAINE-EPINEPHRINE (PF) 0.5% -1:200000 IJ SOLN
INTRAMUSCULAR | Status: AC
  Filled 2021-06-13: qty 30

## 2021-06-13 MED ORDER — OXYCODONE HCL 5 MG PO TABS
ORAL_TABLET | ORAL | Status: AC
  Filled 2021-06-13: qty 1

## 2021-06-13 MED ORDER — ONDANSETRON HCL 4 MG/2ML IJ SOLN
INTRAMUSCULAR | Status: DC | PRN
  Administered 2021-06-13: 4 mg via INTRAVENOUS

## 2021-06-13 MED ORDER — VANCOMYCIN HCL 1 G IV SOLR
INTRAVENOUS | Status: DC | PRN
  Administered 2021-06-13: 1000 mg via TOPICAL

## 2021-06-13 MED ORDER — PHENYLEPHRINE HCL-NACL 20-0.9 MG/250ML-% IV SOLN
INTRAVENOUS | Status: DC | PRN
  Administered 2021-06-13: 40 ug/min via INTRAVENOUS

## 2021-06-13 MED ORDER — KETAMINE HCL 50 MG/5ML IJ SOSY
PREFILLED_SYRINGE | INTRAMUSCULAR | Status: AC
  Filled 2021-06-13: qty 5

## 2021-06-13 MED ORDER — 0.9 % SODIUM CHLORIDE (POUR BTL) OPTIME
TOPICAL | Status: DC | PRN
  Administered 2021-06-13: 1000 mL

## 2021-06-13 MED ORDER — ONDANSETRON HCL 4 MG PO TABS: 4.0000 mg | ORAL_TABLET | Freq: Four times a day (QID) | ORAL | Status: DC | PRN

## 2021-06-13 MED ORDER — ACETAMINOPHEN 325 MG PO TABS: 325.0000 mg | ORAL_TABLET | Freq: Four times a day (QID) | ORAL | Status: DC | PRN

## 2021-06-13 MED ORDER — BUPIVACAINE HCL (PF) 0.5 % IJ SOLN
INTRAMUSCULAR | Status: DC | PRN
  Administered 2021-06-13: 3 mL via INTRATHECAL

## 2021-06-13 MED ORDER — SODIUM CHLORIDE 0.9 % IR SOLN
Status: DC | PRN
  Administered 2021-06-13: 3000 mL

## 2021-06-13 MED ORDER — OXYCODONE HCL 5 MG/5ML PO SOLN: 5.0000 mg | Freq: Once | ORAL | Status: AC | PRN

## 2021-06-13 MED ORDER — ONDANSETRON HCL 4 MG/2ML IJ SOLN: 4.0000 mg | Freq: Four times a day (QID) | INTRAMUSCULAR | Status: DC | PRN

## 2021-06-13 MED ORDER — CEFAZOLIN SODIUM-DEXTROSE 2-4 GM/100ML-% IV SOLN
2.0000 g | Freq: Four times a day (QID) | INTRAVENOUS | Status: AC
  Administered 2021-06-13 – 2021-06-14 (×2): 2 g via INTRAVENOUS
  Filled 2021-06-13 (×3): qty 100

## 2021-06-13 MED ORDER — HYDROCODONE-ACETAMINOPHEN 5-325 MG PO TABS
1.0000 | ORAL_TABLET | ORAL | Status: DC | PRN
  Administered 2021-06-14: 1 via ORAL
  Filled 2021-06-13: qty 1
  Filled 2021-06-13: qty 2

## 2021-06-13 SURGICAL SUPPLY — 61 items
BLADE SAGITTAL WIDE XTHICK NO (BLADE) ×2 IMPLANT
BNDG COHESIVE 4X5 TAN ST LF (GAUZE/BANDAGES/DRESSINGS) ×2 IMPLANT
CEMENT BONE 40GM (Cement) ×4 IMPLANT
COVER BACK TABLE REUSABLE LG (DRAPES) ×2 IMPLANT
DRAPE 3/4 80X56 (DRAPES) ×4 IMPLANT
DRAPE IMP U-DRAPE 54X76 (DRAPES) ×2 IMPLANT
DRAPE INCISE IOBAN 66X45 STRL (DRAPES) ×2 IMPLANT
DRAPE INCISE IOBAN 66X60 STRL (DRAPES) ×2 IMPLANT
DRAPE ORTHO SPLIT 77X108 STRL (DRAPES) ×2
DRAPE SURG 17X11 SM STRL (DRAPES) ×2 IMPLANT
DRAPE SURG ORHT 6 SPLT 77X108 (DRAPES) ×2 IMPLANT
DRSG AQUACEL AG ADV 3.5X10 (GAUZE/BANDAGES/DRESSINGS) ×2 IMPLANT
DURAPREP 26ML APPLICATOR (WOUND CARE) ×4 IMPLANT
ELECT CAUTERY BLADE 6.4 (BLADE) ×1 IMPLANT
ELECT REM PT RETURN 9FT ADLT (ELECTROSURGICAL) ×2
ELECTRODE REM PT RTRN 9FT ADLT (ELECTROSURGICAL) ×1 IMPLANT
GAUZE 4X4 16PLY ~~LOC~~+RFID DBL (SPONGE) ×2 IMPLANT
GAUZE SPONGE 4X4 12PLY STRL (GAUZE/BANDAGES/DRESSINGS) ×2 IMPLANT
GAUZE XEROFORM 1X8 LF (GAUZE/BANDAGES/DRESSINGS) ×3 IMPLANT
GLOVE BIO SURGEON STRL SZ7.5 (GLOVE) ×2 IMPLANT
GLOVE SURG UNDER POLY LF SZ7.5 (GLOVE) ×2 IMPLANT
GOWN STRL REUS W/ TWL XL LVL3 (GOWN DISPOSABLE) ×1 IMPLANT
GOWN STRL REUS W/TWL XL LVL3 (GOWN DISPOSABLE) ×1
HEAD FEM UNIPOLAR 52 OD STRL (Hips) ×2 IMPLANT
HEMOVAC 400ML (MISCELLANEOUS)
HOLSTER ELECTROSUGICAL PENCIL (MISCELLANEOUS) ×2 IMPLANT
HOOD PEEL AWAY FLYTE STAYCOOL (MISCELLANEOUS) ×3 IMPLANT
IV NS IRRIG 3000ML ARTHROMATIC (IV SOLUTION) ×2 IMPLANT
KIT DRAIN HEMOVAC JP 7FR 400ML (MISCELLANEOUS) IMPLANT
KIT PREP HIP W/CEMENT RESTRICT (Miscellaneous) IMPLANT
KIT PREPARATION TOTAL HIP (Miscellaneous) ×1 IMPLANT
KIT TURNOVER KIT A (KITS) ×2 IMPLANT
MANIFOLD NEPTUNE II (INSTRUMENTS) ×2 IMPLANT
NDL MAYO CATGUT SZ4 TPR NDL (NEEDLE) ×1 IMPLANT
NDL SAFETY ECLIPSE 18X1.5 (NEEDLE) ×1 IMPLANT
NEEDLE HYPO 18GX1.5 SHARP (NEEDLE) ×1
NEEDLE MAYO CATGUT SZ4 (NEEDLE) ×2 IMPLANT
NS IRRIG 1000ML POUR BTL (IV SOLUTION) ×2 IMPLANT
PACK HIP PROSTHESIS (MISCELLANEOUS) ×2 IMPLANT
PILLOW ABDUCTION FOAM SM (MISCELLANEOUS) ×2 IMPLANT
PRESSURIZER FEM CANAL M (MISCELLANEOUS) IMPLANT
PULSAVAC PLUS IRRIG FAN TIP (DISPOSABLE) ×2
RETRIEVER SUT HEWSON (MISCELLANEOUS) ×2 IMPLANT
SPACER DEPUY (Hips) ×1 IMPLANT
SPONGE T-LAP 18X18 ~~LOC~~+RFID (SPONGE) ×8 IMPLANT
STAPLER SKIN PROX 35W (STAPLE) ×2 IMPLANT
STEM SUMMIT CEMENTED BASIC SZ3 (Hips) IMPLANT
STOCKINETTE IMPERVIOUS LG (DRAPES) ×1 IMPLANT
SUMMIT CEMENT BASIC SZ3 (Hips) ×2 IMPLANT
SUT ETHIBOND 2 V 37 (SUTURE) ×2 IMPLANT
SUT QUILL PDO 0 36 36 VIOLET (SUTURE) ×2 IMPLANT
SUT TICRON 2-0 30IN 311381 (SUTURE) ×8 IMPLANT
SUT VIC AB 0 CT1 36 (SUTURE) ×2 IMPLANT
SUT VIC AB 2-0 CT1 27 (SUTURE) ×2
SUT VIC AB 2-0 CT1 TAPERPNT 27 (SUTURE) ×2 IMPLANT
SUT VIC AB 2-0 CT2 27 (SUTURE) ×4 IMPLANT
SYR 10ML LL (SYRINGE) ×2 IMPLANT
TIP FAN IRRIG PULSAVAC PLUS (DISPOSABLE) ×1 IMPLANT
TOWER CARTRIDGE SMART MIX (DISPOSABLE) ×1 IMPLANT
TRAY FOLEY SLVR 16FR LF STAT (SET/KITS/TRAYS/PACK) ×1 IMPLANT
TUBE SUCT KAM VAC (TUBING) ×1 IMPLANT

## 2021-06-13 NOTE — Anesthesia Procedure Notes (Signed)
Spinal  Patient location during procedure: ED Start time: 06/13/2021 1:52 PM Reason for block: surgical anesthesia Preanesthetic Checklist Completed: patient identified, IV checked, site marked, risks and benefits discussed, surgical consent, monitors and equipment checked, pre-op evaluation and timeout performed Spinal Block Patient position: left lateral decubitus Prep: ChloraPrep Patient monitoring: heart rate, continuous pulse ox, blood pressure and cardiac monitor Approach: midline Location: L4-5 Injection technique: single-shot Needle Needle type: Introducer and Pencan  Needle gauge: 24 G Needle length: 10 cm Assessment Events: CSF return Additional Notes Negative paresthesia. Negative blood return. Positive free-flowing CSF. Expiration date of kit checked and confirmed. Patient tolerated procedure well, without complications.

## 2021-06-13 NOTE — Consult Note (Signed)
ORTHOPAEDIC CONSULTATION  REQUESTING PHYSICIAN: Cipriano Bunker, MD  Chief Complaint: Right femoral neck fracture  HPI: Mitchell Ayers is a 81 y.o. male who complains of right hip pain after mechanical fall. The pain is sharp in character. The pain is worse with movement and better with rest. Denies any numbness, tingling or constitutional symptoms.  Patient states that he lives at home in [place].  He is not on any anticoagulation.  Does not use any assistive ambulatory devices. PMH notable for hypertension, hyperlipidemia, TIA, GERD, hypothyroidism, anxiety, BPH, anemia, rheumatoid arthritis, essential tremor.   Past Medical History:  Diagnosis Date   Anxiety    HLD (hyperlipidemia)    HTN (hypertension)    Hypothyroid    Normocytic anemia    RA (rheumatoid arthritis) (HCC)    Past Surgical History:  Procedure Laterality Date   CHOLECYSTECTOMY     Left knee surgery     Social History   Socioeconomic History   Marital status: Married    Spouse name: Not on file   Number of children: Not on file   Years of education: Not on file   Highest education level: Not on file  Occupational History   Not on file  Tobacco Use   Smoking status: Former    Types: Cigarettes   Smokeless tobacco: Never  Substance and Sexual Activity   Alcohol use: Not Currently   Drug use: Never   Sexual activity: Not on file  Other Topics Concern   Not on file  Social History Narrative   Not on file   Social Determinants of Health   Financial Resource Strain: Not on file  Food Insecurity: Not on file  Transportation Needs: Not on file  Physical Activity: Not on file  Stress: Not on file  Social Connections: Not on file   Family History  Problem Relation Age of Onset   Hypertension Mother    Parkinson's disease Father    Stomach cancer Father    Allergies  Allergen Reactions   Tetracyclines & Related Anaphylaxis   Tamsulosin     Other reaction(s): Headache Left sided headache    Prior to Admission medications   Medication Sig Start Date End Date Taking? Authorizing Provider  atorvastatin (LIPITOR) 20 MG tablet Take 20 mg by mouth daily. 06/09/21  Yes [provider]  Azelastine HCl 137 MCG/SPRAY SOLN Place 1 spray into both nostrils 2 (two) times daily. 06/12/21  Yes [provider]  clorazepate (TRANXENE) 7.5 MG tablet Take 7.5 mg by mouth 3 (three) times daily. 05/22/21  Yes [provider]  clotrimazole-betamethasone (LOTRISONE) cream Apply 1 application. topically daily. 06/11/21  Yes [provider]  fluticasone (FLONASE) 50 MCG/ACT nasal spray Place 2 sprays into both nostrils daily. 12/27/20  Yes [provider]  folic acid (FOLVITE) 1 MG tablet Take 2 mg by mouth daily. 10/05/20  Yes [provider]  gabapentin (NEURONTIN) 100 MG capsule Take 100 mg by mouth 2 (two) times daily. 06/10/21  Yes [provider]  levothyroxine (SYNTHROID) 100 MCG tablet Take 100 mcg by mouth daily. 06/09/21  Yes [provider]  lisinopril (ZESTRIL) 5 MG tablet Take 5 mg by mouth 2 (two) times daily. 03/26/21  Yes [provider]  pantoprazole (PROTONIX) 40 MG tablet Take 40 mg by mouth every morning. 04/13/21  Yes [provider]  amoxicillin (AMOXIL) 500 MG tablet Take 2,000 mg by mouth as directed. One hour prior to Dental Treatment 04/18/21   [provider]  diphenoxylate-atropine (LOMOTIL) 2.5-0.025 MG tablet Take 2 tablets by mouth 3 (three) times daily as needed for diarrhea or loose stools. 06/12/21   [provider]  methotrexate (RHEUMATREX) 2.5 MG tablet Take 12.5 mg by mouth once a week. 12/25/20   [provider]   DG Chest 1 View  Result Date: 06/12/2021 CLINICAL DATA:  Fall. EXAM: CHEST  1 VIEW COMPARISON:  None Available. FINDINGS: The lungs are clear without focal pneumonia, edema, pneumothorax or pleural effusion. Interstitial markings are diffusely coarsened with  chronic features. The cardiopericardial silhouette is within normal limits for size. Bones are diffusely demineralized. Telemetry leads overlie the chest. IMPRESSION: No active disease. Electronically Signed   By: Kennith Center M.D.   On: 06/12/2021 14:06   DG Ankle Complete Right  Result Date: 06/12/2021 CLINICAL DATA:  Patient missed a step and injured right ankle. Pain. EXAM: RIGHT ANKLE - COMPLETE 3+ VIEW COMPARISON:  None Available. FINDINGS: There is no evidence of fracture, dislocation, or joint effusion. There is no evidence of arthropathy or other focal bone abnormality. Soft tissues are unremarkable. IMPRESSION: Negative. Electronically Signed   By: Kennith Center M.D.   On: 06/12/2021 14:05   CT Head Wo Contrast  Result Date: 06/12/2021 CLINICAL DATA:  Moderate to severe head trauma EXAM: CT HEAD WITHOUT CONTRAST CT CERVICAL SPINE WITHOUT CONTRAST TECHNIQUE: Multidetector CT imaging of the head and cervical spine was performed following the standard protocol without intravenous contrast. Multiplanar CT image reconstructions of the cervical spine were also generated. RADIATION DOSE REDUCTION: This exam was performed according to the departmental dose-optimization program which includes automated exposure control, adjustment of the mA and/or kV according to patient size and/or use of iterative reconstruction technique. COMPARISON:  None Available. FINDINGS: CT HEAD FINDINGS Brain: No acute intracranial hemorrhage. No focal mass lesion. No CT evidence of acute infarction. No midline shift or mass effect. No hydrocephalus. Basilar cisterns are patent. There are periventricular and subcortical white matter hypodensities. Generalized cortical atrophy. Vascular: No hyperdense vessel or unexpected calcification. Skull: Normal. Negative for fracture or focal lesion. Sinuses/Orbits: Paranasal sinuses and mastoid air cells are clear. Orbits are clear. Other: None. CT CERVICAL SPINE FINDINGS Alignment: Normal  alignment of the cervical vertebral bodies. Skull base and vertebrae: Normal craniocervical junction. No loss of vertebral body height or disc height. Normal facet articulation. No evidence of fracture. Soft tissues and spinal canal: No prevertebral soft tissue swelling. No perispinal or epidural hematoma. Disc levels: Endplates spurring and joint space narrowing at C5-C6 and C6-C7. No acute findings. Upper chest: Clear Other: Nuchal calcifications posterior to C6. IMPRESSION: 1. No acute intracranial findings. 2. No cervical spine fracture. 3. Multilevel disc osteophytic disease. Electronically Signed   By: Genevive Bi M.D.   On: 06/12/2021 13:53   CT Cervical Spine Wo Contrast  Result Date: 06/12/2021 CLINICAL DATA:  Moderate to severe head trauma EXAM: CT HEAD WITHOUT CONTRAST CT CERVICAL SPINE WITHOUT CONTRAST TECHNIQUE: Multidetector CT imaging of the head and cervical spine was performed following the standard protocol without intravenous contrast. Multiplanar CT image reconstructions of the cervical spine were also generated. RADIATION DOSE REDUCTION: This exam was performed according to the departmental dose-optimization program which includes automated exposure control, adjustment of the mA and/or kV according to patient size and/or use of iterative reconstruction technique. COMPARISON:  None Available. FINDINGS: CT HEAD FINDINGS Brain: No acute intracranial hemorrhage. No focal mass lesion. No CT evidence of acute infarction. No midline shift or mass effect. No hydrocephalus. Basilar  cisterns are patent. There are periventricular and subcortical white matter hypodensities. Generalized cortical atrophy. Vascular: No hyperdense vessel or unexpected calcification. Skull: Normal. Negative for fracture or focal lesion. Sinuses/Orbits: Paranasal sinuses and mastoid air cells are clear. Orbits are clear. Other: None. CT CERVICAL SPINE FINDINGS Alignment: Normal alignment of the cervical vertebral bodies.  Skull base and vertebrae: Normal craniocervical junction. No loss of vertebral body height or disc height. Normal facet articulation. No evidence of fracture. Soft tissues and spinal canal: No prevertebral soft tissue swelling. No perispinal or epidural hematoma. Disc levels: Endplates spurring and joint space narrowing at C5-C6 and C6-C7. No acute findings. Upper chest: Clear Other: Nuchal calcifications posterior to C6. IMPRESSION: 1. No acute intracranial findings. 2. No cervical spine fracture. 3. Multilevel disc osteophytic disease. Electronically Signed   By: Genevive BiStewart  Edmunds M.D.   On: 06/12/2021 13:53   DG Hip Unilat W or Wo Pelvis 2-3 Views Right  Result Date: 06/12/2021 CLINICAL DATA:  Missed a step.  Fall. EXAM: DG HIP (WITH OR WITHOUT PELVIS) 2-3V RIGHT COMPARISON:  None Available. FINDINGS: Three view study. Right femoral neck fracture noted with varus angulation. SI joints and pubis symphysis unremarkable. No evidence for superior or inferior pubic ramus fracture. No worrisome lytic or sclerotic osseous abnormality. IMPRESSION: Right femoral neck fracture with varus angulation. Electronically Signed   By: Kennith CenterEric  Mansell M.D.   On: 06/12/2021 14:06    Positive ROS: All other systems have been reviewed and were otherwise negative with the exception of those mentioned in the HPI and as above.  Physical Exam: General: Alert, no acute distress Cardiovascular: No pedal edema Respiratory: No cyanosis, no use of accessory musculature GI: No organomegaly, abdomen is soft and non-tender Skin: No lesions in the area of chief complaint Neurologic: Sensation intact distally Psychiatric: Patient is competent for consent with normal mood and affect Lymphatic: No axillary or cervical lymphadenopathy  MUSCULOSKELETAL:  Right hip: Tenderness palpation about the hip, no tenderness about the knee or ankle.  Right lower extremities grossly neurovascular intact   Assessment: 81 year old male admitted  with a displaced right femoral neck fracture  Plan: Appreciate hospitalist team support.  I had a long discussion with the patient regarding his diagnosis and fracture pattern and treatment options.  Recommendation was made for right hip hemiarthroplasty.  Risks and benefits of surgery were discussed with the patient in detail.  Plan will be for surgery later today.  Please keep NPO.    Ross MarcusMatthew Terrance Usery, MD    06/13/2021 7:35 AM

## 2021-06-13 NOTE — Progress Notes (Signed)
PROGRESS NOTE    Miner Tiso  [id number] DOB: [birth year] DOA: 06/12/2021 PCP: Dartha Lodge   Brief Narrative: This 81 years old male with PMH significant for hypertension, hyperlipidemia, TIA, GERD, hypothyroidism, anxiety, BPH, anemia, rheumatoid arthritis, essential tremor presented s/p fall with right hip pain.  Patient report he was stepping down off a step stool this afternoon missing the last exam and fell on his right side and developed right hip pain.  Patient denies any loss of consciousness.  Work-up in the ED shows right femoral neck fracture.  Ortho is consulted.  Patient is scheduled to have ORIF today  Assessment & Plan:   Principal Problem:   Fracture of femoral neck, right, closed ([place]) Active Problems:   Fall at home, initial encounter   HTN (hypertension)   HLD (hyperlipidemia)   Hypothyroid   Anxiety   Normocytic anemia   RA (rheumatoid arthritis) ([place])   TIA (transient ischemic attack)  Fracture of femoral neck, right , closed: Patient presented status post fall.  X-ray showed right femoral neck fracture. Orthopedic consulted.  Patient is scheduled to have ORIF today Continue adequate pain control with morphine, Percocet ,Tylenol as needed Continue Zofran as needed for nausea. Continue Robaxin for muscle spasm. PT and OT when able to after surgery.  History of TIA: Continue Lipitor 20 mg daily.  Rheumatoid arthritis: Continue home methotrexate 12.5 mg weekly  Normocytic anemia: H&H is stable.  Follow-up CBC.  Generalized anxiety: Continue Klonopin 0.5 mg tid  Hypothyroidism: Continue Synthroid 100 mg daily  Hyperlipidemia: Continue Lipitor 20 mg daily  Essential hypertension: Continue Lisinopril 5 mg daily   DVT prophylaxis: SCDs Code Status: Full code. Family Communication: No family at bed side. Disposition Plan:   Status is: Inpatient Remains inpatient appropriate because: Admitted s/p fall with right femoral fracture. Patient  is scheduled to have ORIF today.  Anticipated discharge back to SNF in 1 to 2 days.     Consultants:  Orthopedics  Procedures: ORIF Antimicrobials: Anti-infectives (From admission, onward)    Start     Dose/Rate Route Frequency Ordered Stop   06/13/21 1400  [MAR Hold]  ceFAZolin (ANCEF) IVPB 2g/100 mL premix        (MAR Hold since Wed 06/13/2021 at 1216.Hold Reason: Transfer to a Procedural area)   2 g 200 mL/hr over 30 Minutes Intravenous To Surgery 06/13/21 0741 06/14/21 1400        Subjective: Patient was seen and examined at bedside.  Overnight events noted.   Patient reports having pain in the right hip, anticipating ORIF today.  Objective: Vitals:   06/12/21 2018 06/12/21 2317 06/13/21 0425 06/13/21 0840  BP: (!) 124/55 125/62 107/60 124/66  Pulse: 93 85 68 64  Resp: 15  14 15   Temp: 99.8 F (37.7 C)  98.8 F (37.1 C) 98 F (36.7 C)  TempSrc:      SpO2: 97%  98% 98%  Weight:      Height:        Intake/Output Summary (Last 24 hours) at 06/13/2021 1226 Last data filed at 06/13/2021 0626 Gross per 24 hour  Intake 1216.15 ml  Output 1933 ml  Net -716.85 ml   Filed Weights   06/12/21 1312 06/12/21 1909  Weight: 66.3 kg 66.3 kg    Examination:  General exam: Appears comfortable, not in any acute distress.  Deconditioned Respiratory system: CTA bilaterally, no wheezing, no crackles, normal respiratory effort. Cardiovascular system: S1-S2 heard, regular rate and rhythm, no murmur. Gastrointestinal system: Abdomen  is soft, non distended, non tender, BS+ Central nervous system: Alert and oriented x 3. No focal neurological deficits. Extremities: Tenderness noted in the right hip area, RLE externally rotated. Skin: No rashes, lesions or ulcers Psychiatry: Judgement and insight appear normal. Mood & affect appropriate.     Data Reviewed: I have personally reviewed following labs and imaging studies  CBC: Recent Labs  Lab 06/12/21 1323 06/13/21 0445  WBC 7.7  10.4  NEUTROABS 5.7  --   HGB 10.6* 9.5*  HCT 33.3* 30.2*  MCV 95.7 96.5  PLT 285 [id number]   Basic Metabolic Panel: Recent Labs  Lab 06/12/21 1323  NA 134*  K 4.7  CL 100  CO2 29  GLUCOSE 112*  BUN 17  CREATININE 0.98  CALCIUM 8.7*   GFR: Estimated Creatinine Clearance: 52.4 mL/min (by C-G formula based on SCr of 0.98 mg/dL). Liver Function Tests: No results for input(s): AST, ALT, ALKPHOS, BILITOT, PROT, ALBUMIN in the last 168 hours. No results for input(s): LIPASE, AMYLASE in the last 168 hours. No results for input(s): AMMONIA in the last 168 hours. Coagulation Profile: Recent Labs  Lab 06/12/21 2024  INR 1.2   Cardiac Enzymes: No results for input(s): CKTOTAL, CKMB, CKMBINDEX, TROPONINI in the last 168 hours. BNP (last 3 results) No results for input(s): PROBNP in the last 8760 hours. HbA1C: No results for input(s): HGBA1C in the last 72 hours. CBG: No results for input(s): GLUCAP in the last 168 hours. Lipid Profile: No results for input(s): CHOL, HDL, LDLCALC, TRIG, CHOLHDL, LDLDIRECT in the last 72 hours. Thyroid Function Tests: No results for input(s): TSH, T4TOTAL, FREET4, T3FREE, THYROIDAB in the last 72 hours. Anemia Panel: No results for input(s): VITAMINB12, FOLATE, FERRITIN, TIBC, IRON, RETICCTPCT in the last 72 hours. Sepsis Labs: No results for input(s): PROCALCITON, LATICACIDVEN in the last 168 hours.  Recent Results (from the past 240 hour(s))  Surgical pcr screen     Status: None   Collection Time: 06/12/21  8:46 PM   Specimen: Nasal Mucosa; Nasal Swab  Result Value Ref Range Status   MRSA, PCR NEGATIVE NEGATIVE Final   Staphylococcus aureus NEGATIVE NEGATIVE Final    Comment: (NOTE) The Xpert SA Assay (FDA approved for NASAL specimens in patients 61 years of age and older), is one component of a comprehensive surveillance program. It is not intended to diagnose infection nor to guide or monitor treatment. Performed at [facility],  [address] [place], [place] [address]     Radiology Studies: DG Chest 1 View  Result Date: 06/12/2021 CLINICAL DATA:  Fall. EXAM: CHEST  1 VIEW COMPARISON:  None Available. FINDINGS: The lungs are clear without focal pneumonia, edema, pneumothorax or pleural effusion. Interstitial markings are diffusely coarsened with chronic features. The cardiopericardial silhouette is within normal limits for size. Bones are diffusely demineralized. Telemetry leads overlie the chest. IMPRESSION: No active disease. Electronically Signed   By: Misty Stanley M.D.   On: 06/12/2021 14:06   DG Ankle Complete Right  Result Date: 06/12/2021 CLINICAL DATA:  Patient missed a step and injured right ankle. Pain. EXAM: RIGHT ANKLE - COMPLETE 3+ VIEW COMPARISON:  None Available. FINDINGS: There is no evidence of fracture, dislocation, or joint effusion. There is no evidence of arthropathy or other focal bone abnormality. Soft tissues are unremarkable. IMPRESSION: Negative. Electronically Signed   By: Misty Stanley M.D.   On: 06/12/2021 14:05   CT Head Wo Contrast  Result Date: 06/12/2021 CLINICAL DATA:  Moderate to  severe head trauma EXAM: CT HEAD WITHOUT CONTRAST CT CERVICAL SPINE WITHOUT CONTRAST TECHNIQUE: Multidetector CT imaging of the head and cervical spine was performed following the standard protocol without intravenous contrast. Multiplanar CT image reconstructions of the cervical spine were also generated. RADIATION DOSE REDUCTION: This exam was performed according to the departmental dose-optimization program which includes automated exposure control, adjustment of the mA and/or kV according to patient size and/or use of iterative reconstruction technique. COMPARISON:  None Available. FINDINGS: CT HEAD FINDINGS Brain: No acute intracranial hemorrhage. No focal mass lesion. No CT evidence of acute infarction. No midline shift or mass effect. No hydrocephalus. Basilar cisterns are patent. There are periventricular  and subcortical white matter hypodensities. Generalized cortical atrophy. Vascular: No hyperdense vessel or unexpected calcification. Skull: Normal. Negative for fracture or focal lesion. Sinuses/Orbits: Paranasal sinuses and mastoid air cells are clear. Orbits are clear. Other: None. CT CERVICAL SPINE FINDINGS Alignment: Normal alignment of the cervical vertebral bodies. Skull base and vertebrae: Normal craniocervical junction. No loss of vertebral body height or disc height. Normal facet articulation. No evidence of fracture. Soft tissues and spinal canal: No prevertebral soft tissue swelling. No perispinal or epidural hematoma. Disc levels: Endplates spurring and joint space narrowing at C5-C6 and C6-C7. No acute findings. Upper chest: Clear Other: Nuchal calcifications posterior to C6. IMPRESSION: 1. No acute intracranial findings. 2. No cervical spine fracture. 3. Multilevel disc osteophytic disease. Electronically Signed   By: Suzy Bouchard M.D.   On: 06/12/2021 13:53   CT Cervical Spine Wo Contrast  Result Date: 06/12/2021 CLINICAL DATA:  Moderate to severe head trauma EXAM: CT HEAD WITHOUT CONTRAST CT CERVICAL SPINE WITHOUT CONTRAST TECHNIQUE: Multidetector CT imaging of the head and cervical spine was performed following the standard protocol without intravenous contrast. Multiplanar CT image reconstructions of the cervical spine were also generated. RADIATION DOSE REDUCTION: This exam was performed according to the departmental dose-optimization program which includes automated exposure control, adjustment of the mA and/or kV according to patient size and/or use of iterative reconstruction technique. COMPARISON:  None Available. FINDINGS: CT HEAD FINDINGS Brain: No acute intracranial hemorrhage. No focal mass lesion. No CT evidence of acute infarction. No midline shift or mass effect. No hydrocephalus. Basilar cisterns are patent. There are periventricular and subcortical white matter hypodensities.  Generalized cortical atrophy. Vascular: No hyperdense vessel or unexpected calcification. Skull: Normal. Negative for fracture or focal lesion. Sinuses/Orbits: Paranasal sinuses and mastoid air cells are clear. Orbits are clear. Other: None. CT CERVICAL SPINE FINDINGS Alignment: Normal alignment of the cervical vertebral bodies. Skull base and vertebrae: Normal craniocervical junction. No loss of vertebral body height or disc height. Normal facet articulation. No evidence of fracture. Soft tissues and spinal canal: No prevertebral soft tissue swelling. No perispinal or epidural hematoma. Disc levels: Endplates spurring and joint space narrowing at C5-C6 and C6-C7. No acute findings. Upper chest: Clear Other: Nuchal calcifications posterior to C6. IMPRESSION: 1. No acute intracranial findings. 2. No cervical spine fracture. 3. Multilevel disc osteophytic disease. Electronically Signed   By: Suzy Bouchard M.D.   On: 06/12/2021 13:53   DG Hip Unilat W or Wo Pelvis 2-3 Views Right  Result Date: 06/12/2021 CLINICAL DATA:  Missed a step.  Fall. EXAM: DG HIP (WITH OR WITHOUT PELVIS) 2-3V RIGHT COMPARISON:  None Available. FINDINGS: Three view study. Right femoral neck fracture noted with varus angulation. SI joints and pubis symphysis unremarkable. No evidence for superior or inferior pubic ramus fracture. No worrisome lytic or sclerotic osseous  abnormality. IMPRESSION: Right femoral neck fracture with varus angulation. Electronically Signed   By: Misty Stanley M.D.   On: 06/12/2021 14:06    Scheduled Meds:  [MAR Hold] atorvastatin  20 mg Oral Daily   [MAR Hold] azelastine  1 spray Each Nare BID   [MAR Hold] clonazePAM  0.5 mg Oral TID   [MAR Hold] clotrimazole-betamethasone  1 application. Topical Daily   [MAR Hold] folic acid  2 mg Oral Daily   [MAR Hold] gabapentin  100 mg Oral BID   [MAR Hold] levothyroxine  100 mcg Oral Daily   [MAR Hold] lisinopril  5 mg Oral BID   [MAR Hold] methotrexate  12.5 mg  Oral Weekly   [MAR Hold] pantoprazole  40 mg Oral q morning   Continuous Infusions:  sodium chloride 75 mL/hr at 06/12/21 2316   [MAR Hold]  ceFAZolin (ANCEF) IV       LOS: 1 day    Time spent: 50 mins    Lataisha Colan, MD [facility]   If 7PM-7AM, please contact night-coverage

## 2021-06-13 NOTE — Anesthesia Preprocedure Evaluation (Addendum)
Anesthesia Evaluation  Patient identified by MRN, date of birth, ID band Patient awake    Reviewed: Allergy & Precautions, NPO status , Patient's Chart, lab work & pertinent test results  History of Anesthesia Complications Negative for: history of anesthetic complications  Airway Mallampati: IV   Neck ROM: Full    Dental  (+) Upper Dentures   Pulmonary former smoker (quit 20 years ago),    Pulmonary exam normal breath sounds clear to auscultation       Cardiovascular hypertension, Normal cardiovascular exam Rhythm:Regular Rate:Normal  ECG 06/12/21:  Sinus rhythm Ventricular premature complex Right bundle branch block   Neuro/Psych PSYCHIATRIC DISORDERS Anxiety TIA (2009)   GI/Hepatic GERD  ,  Endo/Other  Hypothyroidism   Renal/GU negative Renal ROS     Musculoskeletal  (+) Arthritis , Rheumatoid disorders,    Abdominal   Peds  Hematology  (+) Blood dyscrasia, anemia ,   Anesthesia Other Findings   Reproductive/Obstetrics                            Anesthesia Physical Anesthesia Plan  ASA: 2  Anesthesia Plan: General and Spinal   Post-op Pain Management:    Induction: Intravenous  PONV Risk Score and Plan: 2 and Propofol infusion, TIVA, Treatment may vary due to age or medical condition and Ondansetron  Airway Management Planned: Natural Airway and Nasal Cannula  Additional Equipment:   Intra-op Plan:   Post-operative Plan:   Informed Consent: I have reviewed the patients History and Physical, chart, labs and discussed the procedure including the risks, benefits and alternatives for the proposed anesthesia with the patient or authorized representative who has indicated his/her understanding and acceptance.       Plan Discussed with: CRNA  Anesthesia Plan Comments: (Plan for spinal and GA with natural airway, LMA/GETA backup.  Patient consented for risks of anesthesia  including but not limited to:  - adverse reactions to medications - damage to eyes, teeth, lips or other oral mucosa - nerve damage due to positioning  - sore throat or hoarseness - headache, bleeding, infection, nerve damage 2/2 spinal - damage to heart, brain, nerves, lungs, other parts of body or loss of life  Informed patient about role of CRNA in peri- and intra-operative care.  Patient voiced understanding.)        Anesthesia Quick Evaluation

## 2021-06-13 NOTE — OR Nursing (Addendum)
dried blood noted on penis and foley catheter at uretheral orifice , scrotum swollen otherwise no bruising, redness or drainage noted with skin intact; foley present upon assessment in pre-op, statlock in place, urine drainage bag hanging from appropriate bed hook below bladder level, pt stated "they had a hard time getting the foley in yesterday". Pt stated the site is non-painful when asked. Will continue to monitor and report to PACU upon transfer.

## 2021-06-13 NOTE — Progress Notes (Signed)
Initial Nutrition Assessment  DOCUMENTATION CODES:   Non-severe (moderate) malnutrition in context of chronic illness  INTERVENTION:   -Once diet is advanced, add:   -Ensure Enlive po TID, each supplement provides 350 kcal and 20 grams of protein -MVI with minerals daily   NUTRITION DIAGNOSIS:   Moderate Malnutrition related to chronic illness (TIA) as evidenced by moderate fat depletion, moderate muscle depletion, severe muscle depletion.  GOAL:   Patient will meet greater than or equal to 90% of their needs  MONITOR:   PO intake, Supplement acceptance, Diet advancement  REASON FOR ASSESSMENT:   Malnutrition Screening Tool    ASSESSMENT:   Pt with medical history significant of hypertension, hyperlipidemia, TIA, GERD, hypothyroidism, anxiety, BPH, anemia, rheumatoid arthritis, essential tremor, who presents with fall and right hip pain.  Pt admitted with rt hip fracture s/p fall.   Reviewed I/O's: -717 ml x 24 hours  UOP: 1.9 L x 24 hours  Per orthopedics notes, plan for rt hip hemiarthroplasty today. Pt is currently NPO for procedure.    Pt lethargic at time of visit. He reports he does not feel well and did not sleep well last night. He is requesting eye drops for his dry eyes; discussed with RN and MD. Received verbal order for eye drops BID. Pt shares he uses ReNu eye drops BID.   Reviewed wt hx; no wt hx available to assess at this time.   Pt with increased nutritional needs for post-op healing and would benefit from addition of oral nutrition supplements.   Medications reviewed and include 0.9% sodium chloride infusion @ 75 ml/hr.   Labs reviewed: Na: 134.    NUTRITION - FOCUSED PHYSICAL EXAM:  Flowsheet Row Most Recent Value  Orbital Region Moderate depletion  Upper Arm Region Moderate depletion  Thoracic and Lumbar Region Moderate depletion  Buccal Region Moderate depletion  Temple Region Moderate depletion  Clavicle Bone Region Severe depletion   Clavicle and Acromion Bone Region Severe depletion  Scapular Bone Region Severe depletion  Dorsal Hand Moderate depletion  Patellar Region Moderate depletion  Anterior Thigh Region Moderate depletion  Posterior Calf Region Moderate depletion  Edema (RD Assessment) None  Hair Reviewed  Eyes Reviewed  Mouth Reviewed  Skin Reviewed  Nails Reviewed       Diet Order:   Diet Order             Diet NPO time specified  Diet effective midnight                   EDUCATION NEEDS:   No education needs have been identified at this time  Skin:  Skin Assessment: Skin Integrity Issues: Skin Integrity Issues:: Incisions Incisions: closed rt hip  Last BM:  06/12/21  Height:   Ht Readings from Last 1 Encounters:  06/13/21 5\' 6"  (1.676 m)    Weight:   Wt Readings from Last 1 Encounters:  06/13/21 66.3 kg    Ideal Body Weight:  64.5 kg  BMI:  Body mass index is 23.59 kg/m.  Estimated Nutritional Needs:   Kcal:  1750-1950  Protein:  90-105 grams  Fluid:  > 1.7 L    Levada Schilling, RD, LDN, CDCES Registered Dietitian II Certified Diabetes Care and Education Specialist Please refer to [facility] for RD and/or RD on-call/weekend/after hours pager

## 2021-06-13 NOTE — Transfer of Care (Signed)
Immediate Anesthesia Transfer of Care Note  Patient: Mitchell Ayers  Procedure(s) Performed: ARTHROPLASTY BIPOLAR HIP (HEMIARTHROPLASTY) (Right: Hip)  Patient Location: PACU  Anesthesia Type:General  Level of Consciousness: awake  Airway & Oxygen Therapy: Patient Spontanous Breathing and Patient connected to face mask  Post-op Assessment: Report given to RN and Post -op Vital signs reviewed and stable  Post vital signs: Reviewed and stable  Last Vitals:  Vitals Value Taken Time  BP 101/67 06/13/21 1607  Temp    Pulse 50 06/13/21 1612  Resp 11 06/13/21 1612  SpO2 100 % 06/13/21 1612  Vitals shown include unvalidated device data.  Last Pain:  Vitals:   06/13/21 1226  TempSrc: Temporal  PainSc: 0-No pain      Patients Stated Pain Goal: 1 (06/12/21 2052)  Complications: No notable events documented.

## 2021-06-13 NOTE — Op Note (Signed)
06/13/2021  4:05 PM  PATIENT:  Mitchell Ayers   MRN: [id number]  PRE-OPERATIVE DIAGNOSIS:  Right Femoral Neck Fracture  POST-OPERATIVE DIAGNOSIS:  Right Femoral Neck Fracture  PROCEDURE:  Procedure(s): ARTHROPLASTY BIPOLAR HIP (HEMIARTHROPLASTY)  PREOPERATIVE INDICATIONS:    Mitchell Ayers is an 81 y.o. male who was admitted with a diagnosis of Right Femoral Neck Fracture.  I have recommended surgical fixation with hemiarthroplasty for this injury. I have explained the surgery and the postoperative course to the patient and their family who have agreed with surgical management of this fracture.    The risks benefits and alternatives were discussed with the patient and their family including but not limited to the risks of  infection requiring removal of the prosthesis, bleeding requiring blood transfusion, nerve injury especially to the sciatic nerve leading to foot drop or lower extremity numbness, periprosthetic fracture, dislocation leg length discrepancy, change in lower extremity rotation persistent hip pain, loosening or failure of the components and the need for revision surgery. Medical risks include but are not limited to DVT and pulmonary embolism, myocardial infarction, stroke, pneumonia, respiratory failure and death.  OPERATIVE REPORT     SURGEON:  Renee Harder    ANESTHESIA: Spinal    COMPLICATIONS:  None.   SPECIMEN: Femoral head to pathology    COMPONENTS: De[place]uy [place] size 3 cemented stem, 52 mm head    PROCEDURE IN DETAIL:   The patient was met in the holding area and  identified.  The appropriate hip was identified and marked at the operative site after verbally confirming with the patient that this was the correct site of surgery.  The patient was then transported to the OR  and  underwent spinal anesthesia.  The patient was then placed in the lateral decubitus position with the operative side up and secured on the operating room table with a pegboard and all  bony prominences were adequately padded. This included an axillary roll and additional padding around the nonoperative leg to prevent compression to the common peroneal nerve.    The operative lower extremity was prepped and draped in a sterile fashion.  A time out was performed prior to incision to verify patient's name, date of birth, medical record number, correct site of surgery correct procedure to be performed. The timeout was also used to verify the patient received antibiotics now appropriate instruments, implants and radiographic studies were available in the room. Once all in attendance were in agreement case began.    A posterolateral approach was utilized via sharp dissection  carried down to the subcutaneous tissue.  Bleeding vessels were coagulated using electrocautery.  The fascia lata was identified and incised along the length of the skin incision.  The gluteus maximus muscle was then split in line with its fibers. Self-retaining retractors were  inserted.  With the hip internally rotated, the short external rotators  were identified and removed from the posterior attachment from the greater trochanter. The capsule was identified and a T-shaped capsulotomy was performed. The capsule was tagged with #2 Tycron for later repair.  The femoral neck fracture was exposed, and the femoral head was removed using a corkscrew device. This was measured to be 52 mm in diameter. The attention was then turned to proximal femur preparation.  An oscillating saw was used to perform a proximal femoral osteotomy 1 fingerbreadth above the lesser trochanter. The trial 52 mm femoral head was placed into the acetabulum and had an excellent suction fit. The attention was then turned back  to femoral preparation.    A femoral skid and Cobra retractor were placed under the femoral neck to allow for adequate visualization. A box osteotome was used to make the initial entry into the proximal femur. A single hand reamer  was used to prepare the femoral canal. A T-shaped femoral canal sounder was then used to ensure no penetration femoral cortex had occurred during reaming. The proximal femur was then sequentially broached by hand. A size 3 femoral trial broach was found to have best medial to lateral canal fit. Once adequate mediolateral canal fill was achieved the trial femoral broach, neck, and head was assembled and the hip was reduced. It was found to have excellent stability, equivalent leg lengths with functional range of motion. The trial components were then removed.  I copiously irrigated the femoral canal and then cemented the real femoral prosthesis into place into the appropriate version, slightly anteverted to the normal anatomy, and I impacted the actual 55m head.  The hip was then reduced and taken through functional range of motion and found to have excellent stability. Leg lengths were restored. The hip joint was copiously irrigated.   A soft tissue repair of the capsule was performed using #2 Tycron Excellent posterior capsular repair was achieved. The fascia lata was then closed with interrupted 0 Vicryl suture. The subcutaneous tissues were closed with 2-0 Vicryl and the skin approximated with staples.   The patient was then placed supine on the operative table. Leg lengths were checked clinically and found to be equivalent. An abduction pillow was placed between the lower extremities. The patient was then transferred to a hospital bed and brought to the PACU in stable condition. I was scrubbed and present the entire case and all sharp and instrument counts were correct at the conclusion of the case. I spoke with the patient's family in the postop consultation room to let them know the case was completed without complication patient was stable in recovery room.   Postoperative plan: 1.  Patient will be weightbearing as tolerated with posterior hip precautions. 2.  DVT prophylaxis can start on  postoperative day #1, recommend Lovenox 435mdaily 3.  Keep dressing in place unless saturated 4.  Follow-up in clinic in approximately 2 weeks for staple removal and x-rays  MaRenee HarderMD Orthopedic Surgeon

## 2021-06-14 ENCOUNTER — Encounter: Payer: Self-pay | Admitting: Orthopaedic Surgery

## 2021-06-14 DIAGNOSIS — S72001A Fracture of unspecified part of neck of right femur, initial encounter for closed fracture: Secondary | ICD-10-CM | POA: Diagnosis not present

## 2021-06-14 LAB — CBC
HCT: 29.2 % — ABNORMAL LOW (ref 39.0–52.0)
Hemoglobin: 9.1 g/dL — ABNORMAL LOW (ref 13.0–17.0)
MCH: 30.1 pg (ref 26.0–34.0)
MCHC: 31.2 g/dL (ref 30.0–36.0)
MCV: 96.7 fL (ref 80.0–100.0)
Platelets: 189 10*3/uL (ref 150–400)
RBC: 3.02 MIL/uL — ABNORMAL LOW (ref 4.22–5.81)
RDW: 14.5 % (ref 11.5–15.5)
WBC: 11.6 10*3/uL — ABNORMAL HIGH (ref 4.0–10.5)
nRBC: 0 % (ref 0.0–0.2)

## 2021-06-14 LAB — BASIC METABOLIC PANEL
Anion gap: 3 — ABNORMAL LOW (ref 5–15)
BUN: 17 mg/dL (ref 8–23)
CO2: 26 mmol/L (ref 22–32)
Calcium: 7.9 mg/dL — ABNORMAL LOW (ref 8.9–10.3)
Chloride: 106 mmol/L (ref 98–111)
Creatinine, Ser: 1.03 mg/dL (ref 0.61–1.24)
GFR, Estimated: >60 mL/min (ref >60)
Glucose, Bld: 124 mg/dL — ABNORMAL HIGH (ref 70–99)
Potassium: 3.9 mmol/L (ref 3.5–5.1)
Sodium: 135 mmol/L (ref 135–145)

## 2021-06-14 LAB — PHOSPHORUS: Phosphorus: 2.6 mg/dL (ref 2.5–4.6)

## 2021-06-14 LAB — MAGNESIUM: Magnesium: 1.8 mg/dL (ref 1.7–2.4)

## 2021-06-14 MED ORDER — HYDROCODONE-ACETAMINOPHEN 5-325 MG PO TABS: 1.0000 | ORAL_TABLET | ORAL | 0 refills | Status: DC | PRN

## 2021-06-14 MED ORDER — BLISTEX MEDICATED EX OINT: TOPICAL_OINTMENT | CUTANEOUS | Status: DC | PRN

## 2021-06-14 NOTE — Evaluation (Signed)
Physical Therapy Evaluation Patient Details Name: Mitchell Ayers MRN: [id number] DOB: 14-Dec-1940 Today's Date: 06/14/2021  History of Present Illness  81 y.o. male presents to ED s/p fall resulting in right femoral neck fracture. Medical history significant for hypertension, hyperlipidemia, TIA, GERD, hypothyroidism, anxiety, BPH, anemia, rheumatoid arthritis, essential tremor.   Clinical Impression  Pt received in recliner, agreeable to therapy. He states he lives in a house with his family; has 6 STE and at baseline is fully independent including driving. Bed mobility was not assessed. STS required MIN A, ambulation CGA and limited to within room. Frequent VC for upright posture. Pt had difficulty with left foot clearance related to decreased WB and stance time on RLE. Reviewed WB precautions and posterior hip precautions; left packet with pictures for reminder. BLE present with weakness. Pt would benefit from skilled PT to address deficits in functional mobility, strenght and standing stability for an improved safety and QOL.      Recommendations for follow up therapy are one component of a multi-disciplinary discharge planning process, led by the attending physician.  Recommendations may be updated based on patient status, additional functional criteria and insurance authorization.  Follow Up Recommendations Skilled nursing-short term rehab (<3 hours/day)    Assistance Recommended at Discharge Frequent or constant Supervision/Assistance  Patient can return home with the following  A little help with walking and/or transfers;A lot of help with bathing/dressing/bathroom;Assistance with cooking/housework;Assist for transportation;Help with stairs or ramp for entrance    Equipment Recommendations Other (comment) (TBD at next venue of care)  Recommendations for Other Services       Functional Status Assessment Patient has had a recent decline in their functional status and demonstrates the  ability to make significant improvements in function in a reasonable and predictable amount of time.     Precautions / Restrictions Precautions Precautions: Posterior Hip Precaution Booklet Issued: Yes (comment) Restrictions Weight Bearing Restrictions: Yes RLE Weight Bearing: Weight bearing as tolerated      Mobility  Bed Mobility               General bed mobility comments: not assessed - pt in recliner    Transfers Overall transfer level: Needs assistance Equipment used: Rolling walker (2 wheels) Transfers: Sit to/from Stand Sit to Stand: Min assist, From elevated surface           General transfer comment: MIN A to provide anterior weight shift from EOB and recliner    Ambulation/Gait Ambulation/Gait assistance: Min guard Gait Distance (Feet): 14 Feet     Gait velocity: decreased     General Gait Details: 35ft + 79ft within room. Step-to pattern leading with RLE. Decreased left foot clearance; minimal WB and stance time RLE due to pain. Difficulty with stepping backwards towards sitting surface.  Stairs            Wheelchair Mobility    Modified Rankin (Stroke Patients Only)       Balance Overall balance assessment: Needs assistance   Sitting balance-Leahy Scale: Good       Standing balance-Leahy Scale: Poor Standing balance comment: Reliant on UE support of RW. CGA on occasion during ambulation due to minimal left foot clearance with stepping                             Pertinent Vitals/Pain Pain Assessment Pain Assessment: Faces Faces Pain Scale: Hurts even more Pain Location: right hip Pain Descriptors / Indicators: Aching, Sharp,  Grimacing Pain Intervention(s): Limited activity within patient's tolerance, Monitored during session, Premedicated before session    [place] expects to be discharged to:: Private residence Living Arrangements: Spouse/significant other;Children Available Help at  Discharge: Family;Available 24 hours/day Type of Home: House Home Access: Stairs to enter Entrance Stairs-Rails: Psychiatric nurse of Steps: 6   Home Layout: One level Home Equipment: Conservation officer, nature (2 wheels) Additional Comments: Owns RW; does not use at baseline.    Prior Function Prior Level of Function : Independent/Modified Independent;Driving             Mobility Comments: States he was fully independent prior to fall; drives and does house/yard work. Only fall reported over the last 6 months is the fall leading to this admission.       Hand Dominance        Extremity/Trunk Assessment   Upper Extremity Assessment Upper Extremity Assessment: Overall WFL for tasks assessed    Lower Extremity Assessment Lower Extremity Assessment: RLE deficits/detail RLE Deficits / Details: RLE not tested due to hemiarthroplasty on 06/13/21    Cervical / Trunk Assessment Cervical / Trunk Assessment: Kyphotic  Communication   Communication: No difficulties  Cognition Arousal/Alertness: Awake/alert Behavior During Therapy: WFL for tasks assessed/performed Overall Cognitive Status: Within Functional Limits for tasks assessed                                 General Comments: A&Ox4; very informative historian        General Comments      Exercises Other Exercises Other Exercises: Reviewed precautions and WB status   Assessment/Plan    PT Assessment Patient needs continued PT services  PT Problem List Decreased strength;Decreased range of motion;Decreased activity tolerance;Decreased safety awareness;Decreased balance;Pain;Decreased mobility       PT Treatment Interventions DME instruction;Gait training;Stair training;Functional mobility training;Therapeutic activities;Therapeutic exercise;Balance training;Patient/family education    PT Goals (Current goals can be found in the Care Plan section)  Acute Rehab PT Goals Patient Stated Goal: to  return to fully independent PT Goal Formulation: With patient Time For Goal Achievement: 06/28/21 Potential to Achieve Goals: Good    Frequency BID     Co-evaluation               AM-PAC PT "6 Clicks" Mobility  Outcome Measure Help needed turning from your back to your side while in a flat bed without using bedrails?: A Little Help needed moving from lying on your back to sitting on the side of a flat bed without using bedrails?: A Lot Help needed moving to and from a bed to a chair (including a wheelchair)?: A Little Help needed standing up from a chair using your arms (e.g., wheelchair or bedside chair)?: A Little Help needed to walk in hospital room?: A Little Help needed climbing 3-5 steps with a railing? : Total 6 Click Score: 15    End of Session Equipment Utilized During Treatment: Gait belt Activity Tolerance: Patient tolerated treatment well;Patient limited by pain Patient left: in chair;with chair alarm set   PT Visit Diagnosis: Unsteadiness on feet (R26.81);Other abnormalities of gait and mobility (R26.89);History of falling (Z91.81);Muscle weakness (generalized) (M62.81);Difficulty in walking, not elsewhere classified (R26.2)    Time: [id number] PT Time Calculation (min) (ACUTE ONLY): 31 min   Charges:   PT Evaluation $PT Eval Moderate Complexity: 1 Mod PT Treatments $Therapeutic Activity: 23-37 mins        Marianna Fuss  Luana Shu PT, DPT

## 2021-06-14 NOTE — [place] FL2 (Signed)
[place] MEDICAID FL2 LEVEL OF CARE SCREENING TOOL     IDENTIFICATION  Patient Name: Mitchell PoissonCecil Newborn Birthdate: 04-24-1940 Sex: male Admission Date (Current Location): 06/12/2021  [facility] [place]   Facility and Address:  [facility], [address], [place], [place] [address]      Provider Number: [id number]  Attending Physician Name and Address:  Cipriano BunkerKumar, Pardeep, MD  Relative Name and Phone Number:       Current Level of Care: Hospital Recommended Level of Care: Skilled Nursing Facility Prior Approval Number:    Date Approved/Denied:   PASRR Number: [id number]  Discharge Plan: SNF    Current Diagnoses: Patient Active Problem List   Diagnosis Date Noted   Malnutrition of moderate degree 06/13/2021   Fracture of femoral neck, right, closed (HCC) 06/12/2021   HTN (hypertension) 06/12/2021   HLD (hyperlipidemia) 06/12/2021   Hypothyroid 06/12/2021   Anxiety 06/12/2021   Normocytic anemia 06/12/2021   RA (rheumatoid arthritis) (HCC) 06/12/2021   Fall at home, initial encounter 06/12/2021   TIA (transient ischemic attack) 06/12/2021    Orientation RESPIRATION BLADDER Height & Weight     Self, Time, Situation, Place  Normal Continent Weight: 146 lb 2.6 oz (66.3 kg) Height:  5\' 6"  (167.6 cm)  BEHAVIORAL SYMPTOMS/MOOD NEUROLOGICAL BOWEL NUTRITION STATUS      Continent Diet (regular diet, thin liqiuds)  AMBULATORY STATUS COMMUNICATION OF NEEDS Skin   Limited Assist Verbally Surgical wounds (closed incision right hip, Hydrocolloid dressing)                       Personal Care Assistance Level of Assistance  Dressing, Feeding, Bathing Bathing Assistance: Limited assistance Feeding assistance: Independent Dressing Assistance: Limited assistance     Functional Limitations Info  Sight, Hearing, Speech Sight Info: Adequate Hearing Info: Adequate Speech Info: Adequate    SPECIAL CARE FACTORS FREQUENCY  PT (By  licensed PT), OT (By licensed OT)     PT Frequency: 5x OT Frequency: 5x            Contractures Contractures Info: Not present    Additional Factors Info  Code Status, Allergies Code Status Info: Full Code Allergies Info: Tetracyclines & Related, Tamsulosin           Current Medications (06/14/2021):  This is the current hospital active medication list Current Facility-Administered Medications  Medication Dose Route Frequency Provider Last Rate Last Admin   0.9 %  sodium chloride infusion   Intravenous Continuous Ross Marcusrawford, Matthew, MD 75 mL/hr at 06/12/21 2316 Restarted at 06/13/21 1340   acetaminophen (TYLENOL) tablet 325-650 mg  325-650 mg Oral Q6H PRN Ross Marcusrawford, Matthew, MD       acetaminophen (TYLENOL) tablet 500 mg  500 mg Oral Q6H Ross Marcusrawford, Matthew, MD   500 mg at 06/14/21 [id number]   atorvastatin (LIPITOR) tablet 20 mg  20 mg Oral Daily Ross Marcusrawford, Matthew, MD   20 mg at 06/14/21 1005   azelastine (ASTELIN) 0.1 % nasal spray 1 spray  1 spray Each Nare BID Ross Marcusrawford, Matthew, MD   1 spray at 06/14/21 1005   Chlorhexidine Gluconate Cloth 2 % PADS 6 each  6 each Topical Daily Cipriano BunkerKumar, Pardeep, MD   6 each at 06/14/21 1005   clonazePAM (KLONOPIN) tablet 0.5 mg  0.5 mg Oral TID Ross Marcusrawford, Matthew, MD   0.5 mg at 06/14/21 1005   clotrimazole-betamethasone (LOTRISONE) cream 1 application.  1 application. Topical Daily Ross Marcusrawford, Matthew, MD   1 application.  at 06/14/21 0747   diphenoxylate-atropine (LOMOTIL) 2.5-0.025 MG per tablet 2 tablet  2 tablet Oral TID PRN Ross Marcus, MD   1 tablet at 06/13/21 2148   docusate sodium (COLACE) capsule 100 mg  100 mg Oral BID Ross Marcus, MD   100 mg at 06/14/21 1005   enoxaparin (LOVENOX) injection 40 mg  40 mg Subcutaneous Q24H Ross Marcus, MD   40 mg at 06/14/21 0747   feeding supplement (ENSURE ENLIVE / ENSURE PLUS) liquid 237 mL  237 mL Oral TID BM Ross Marcus, MD   237 mL at 06/14/21 1005   fluticasone (FLONASE) 50 MCG/ACT  nasal spray 2 spray  2 spray Each Nare Daily PRN Ross Marcus, MD       folic acid (FOLVITE) tablet 2 mg  2 mg Oral Daily Ross Marcus, MD   2 mg at 06/14/21 0747   gabapentin (NEURONTIN) capsule 100 mg  100 mg Oral BID Ross Marcus, MD   100 mg at 06/14/21 1005   hydrALAZINE (APRESOLINE) injection 5 mg  5 mg Intravenous Q2H PRN Ross Marcus, MD   5 mg at 06/12/21 1631   HYDROcodone-acetaminophen (NORCO) 7.5-325 MG per tablet 1-2 tablet  1-2 tablet Oral Q4H PRN Ross Marcus, MD       HYDROcodone-acetaminophen (NORCO/VICODIN) 5-325 MG per tablet 1-2 tablet  1-2 tablet Oral Q4H PRN Ross Marcus, MD   1 tablet at 06/14/21 0759   ketotifen (ZADITOR) 0.025 % ophthalmic solution 1 drop  1 drop Both Eyes BID Ross Marcus, MD   1 drop at 06/14/21 1008   levothyroxine (SYNTHROID) tablet 100 mcg  100 mcg Oral Daily Ross Marcus, MD   100 mcg at 06/14/21 1610   lip balm (BLISTEX) ointment   Topical PRN Cipriano Bunker, MD       lisinopril (ZESTRIL) tablet 5 mg  5 mg Oral BID Ross Marcus, MD       menthol-cetylpyridinium (CEPACOL) lozenge 3 mg  1 lozenge Oral PRN Ross Marcus, MD       Or   phenol (CHLORASEPTIC) mouth spray 1 spray  1 spray Mouth/Throat PRN Ross Marcus, MD       methocarbamol (ROBAXIN) tablet 500 mg  500 mg Oral Q8H PRN Ross Marcus, MD       Melene Muller ON 06/16/2021] methotrexate (RHEUMATREX) tablet 12.5 mg  12.5 mg Oral Weekly Ross Marcus, MD       metoCLOPramide (REGLAN) tablet 5-10 mg  5-10 mg Oral Q8H PRN Ross Marcus, MD       Or   metoCLOPramide (REGLAN) injection 5-10 mg  5-10 mg Intravenous Q8H PRN Ross Marcus, MD       morphine (PF) 2 MG/ML injection 0.5-1 mg  0.5-1 mg Intravenous Q2H PRN Ross Marcus, MD       morphine (PF) 2 MG/ML injection 1 mg  1 mg Intravenous Q3H PRN Ross Marcus, MD   1 mg at 06/13/21 1001   multivitamin with minerals tablet 1 tablet  1 tablet Oral Daily Ross Marcus, MD    1 tablet at 06/14/21 1005   ondansetron (ZOFRAN) tablet 4 mg  4 mg Oral Q6H PRN Ross Marcus, MD       Or   ondansetron [facility]) injection 4 mg  4 mg Intravenous Q6H PRN Ross Marcus, MD       oxyCODONE-acetaminophen (PERCOCET/ROXICET) 5-325 MG per tablet 1 tablet  1 tablet Oral Q4H PRN Ross Marcus, MD   1 tablet at 06/12/21 2319   pantoprazole (PROTONIX) EC  tablet 40 mg  40 mg Oral q morning Ross Marcus, MD   40 mg at 06/14/21 1005   senna-docusate (Senokot-S) tablet 1 tablet  1 tablet Oral QHS PRN Ross Marcus, MD       tranexamic acid (CYKLOKAPRON) IVPB 1,000 mg  1,000 mg Intravenous Once Ross Marcus, MD         Discharge Medications: Please see discharge summary for a list of discharge medications.  Relevant Imaging Results:  Relevant Lab Results:   Additional Information SSN:[id number]  Reuel Boom Layn Kye, LCSW

## 2021-06-14 NOTE — Progress Notes (Addendum)
PROGRESS NOTE    Mitchell Ayers  [id number] DOB: Aug 17, 1940 DOA: 06/12/2021 PCP: Dartha Lodge   Brief Narrative: This 81 years old male with PMH significant for hypertension, hyperlipidemia, TIA, GERD, hypothyroidism, anxiety, BPH, anemia, rheumatoid arthritis, essential tremor presented s/p fall with right hip pain.  Patient report he was stepping down off a step stool this afternoon missing the last exam and fell on his right side and developed right hip pain.  Patient denies any loss of consciousness.  Work-up in the ED shows right femoral neck fracture.  Ortho is consulted.  Patient underwent ORIF , tolerated well, post operative day # 1.  PT recommended SNF.  Assessment & Plan:   Principal Problem:   Fracture of femoral neck, right, closed ([place]) Active Problems:   Fall at home, initial encounter   HTN (hypertension)   HLD (hyperlipidemia)   Hypothyroid   Anxiety   Normocytic anemia   RA (rheumatoid arthritis) (HCC)   TIA (transient ischemic attack)   Malnutrition of moderate degree  Fracture of femoral neck, right , closed: Patient presented status post fall.  X-ray showed right femoral neck fracture. Orthopedic consulted.  Patient underwent ORIF.  Postoperative day 1 Continue adequate pain control with morphine, Percocet ,Tylenol as needed Continue Zofran as needed for nausea. Continue Robaxin for muscle spasm. PT and OT when able to after surgery. Ortho recommended Lovenox daily for DVT prophylaxis, weightbearing as tolerated with posterior hip precautions. PT recommended SNF , awaiting insurance authorization.  History of TIA: Continue Lipitor 20 mg daily.  Rheumatoid arthritis: Continue home methotrexate 12.5 mg weekly.  Normocytic anemia: H&H is stable.  Follow-up CBC.  Generalized anxiety: Continue Klonopin 0.5 mg tid  Hypothyroidism: Continue Synthroid 100 mg daily  Hyperlipidemia: Continue Lipitor 20 mg daily  Essential hypertension: Continue  Lisinopril 5 mg daily   DVT prophylaxis: SCDs Code Status: Full code. Family Communication: No family at bed side. Disposition Plan:   Status is: Inpatient Remains inpatient appropriate because: Admitted s/p fall with right femoral fracture. Patient underwent ORIF, postoperative day 1 , tolerated well.  PT recommended SNF awaiting insurance Auth.    Consultants:  Orthopedics  Procedures: ORIF Antimicrobials: Anti-infectives (From admission, onward)    Start     Dose/Rate Route Frequency Ordered Stop   06/13/21 2000  ceFAZolin (ANCEF) IVPB 2g/100 mL premix        2 g 200 mL/hr over 30 Minutes Intravenous Every 6 hours 06/13/21 1738 06/14/21 0536   06/13/21 1433  vancomycin (VANCOCIN) powder  Status:  Discontinued          As needed 06/13/21 1433 06/13/21 1601   06/13/21 1400  ceFAZolin (ANCEF) IVPB 2g/100 mL premix        2 g 200 mL/hr over 30 Minutes Intravenous To Surgery 06/13/21 0741 06/13/21 1409   06/13/21 1226  ceFAZolin (ANCEF) 2-4 GM/100ML-% IVPB       Note to Pharmacy: Register, Santiago Glad A: cabinet override      06/13/21 1226 06/13/21 1434        Subjective: Patient was seen and examined at bedside.  Overnight events noted.   Patient reports feeling much better.  He is s/p ORIF,  postoperative day 1. Patient was sitting comfortably on the chair,  having breakfast states he feels much better.   Objective: Vitals:   06/13/21 1744 06/13/21 2027 06/14/21 0417 06/14/21 0746  BP: (!) 114/55 (!) 98/53 (!) 122/56 (!) 106/52  Pulse: 73 81 80 75  Resp: 17 16 15  18  Temp: (!) 97.5 F (36.4 C) 98 F (36.7 C) 98.3 F (36.8 C) 99.5 F (37.5 C)  TempSrc:      SpO2: 93% 98% 95% 95%  Weight:      Height:        Intake/Output Summary (Last 24 hours) at 06/14/2021 1240 Last data filed at 06/14/2021 1025 Gross per 24 hour  Intake 2280 ml  Output 2025 ml  Net 255 ml   Filed Weights   06/12/21 1312 06/12/21 1909 06/13/21 1226  Weight: 66.3 kg 66.3 kg 66.3 kg     Examination:  General exam: Appears comfortable, not in any acute distress.  Seems pleasant Respiratory system: CTA bilaterally, no wheezing, no crackles, normal respiratory effort. Cardiovascular system: S1-S2 heard, regular rate and rhythm, no murmur. Gastrointestinal system: Abdomen is soft, non distended, non tender, BS+ Central nervous system: Alert and oriented x 3. No focal neurological deficits. Extremities: Mild tenderness noted in the right hip area.  Dressing noted. Skin: No rashes, lesions or ulcers Psychiatry: Judgement and insight appear normal. Mood & affect appropriate.     Data Reviewed: I have personally reviewed following labs and imaging studies  CBC: Recent Labs  Lab 06/12/21 1323 06/13/21 0445 06/14/21 0518  WBC 7.7 10.4 11.6*  NEUTROABS 5.7  --   --   HGB 10.6* 9.5* 9.1*  HCT 33.3* 30.2* 29.2*  MCV 95.7 96.5 96.7  PLT 285 244 [id number]   Basic Metabolic Panel: Recent Labs  Lab 06/12/21 1323 06/14/21 0518  NA 134* 135  K 4.7 3.9  CL 100 106  CO2 29 26  GLUCOSE 112* 124*  BUN 17 17  CREATININE 0.98 1.03  CALCIUM 8.7* 7.9*  MG  --  1.8  PHOS  --  2.6   GFR: Estimated Creatinine Clearance: 49.9 mL/min (by C-G formula based on SCr of 1.03 mg/dL). Liver Function Tests: No results for input(s): "AST", "ALT", "ALKPHOS", "BILITOT", "PROT", "ALBUMIN" in the last 168 hours. No results for input(s): "LIPASE", "AMYLASE" in the last 168 hours. No results for input(s): "AMMONIA" in the last 168 hours. Coagulation Profile: Recent Labs  Lab 06/12/21 2024  INR 1.2   Cardiac Enzymes: No results for input(s): "CKTOTAL", "CKMB", "CKMBINDEX", "TROPONINI" in the last 168 hours. BNP (last 3 results) No results for input(s): "PROBNP" in the last 8760 hours. HbA1C: No results for input(s): "HGBA1C" in the last 72 hours. CBG: No results for input(s): "GLUCAP" in the last 168 hours. Lipid Profile: No results for input(s): "CHOL", "HDL", "LDLCALC", "TRIG",  "CHOLHDL", "LDLDIRECT" in the last 72 hours. Thyroid Function Tests: No results for input(s): "TSH", "T4TOTAL", "FREET4", "T3FREE", "THYROIDAB" in the last 72 hours. Anemia Panel: No results for input(s): "VITAMINB12", "FOLATE", "FERRITIN", "TIBC", "IRON", "RETICCTPCT" in the last 72 hours. Sepsis Labs: No results for input(s): "PROCALCITON", "LATICACIDVEN" in the last 168 hours.  Recent Results (from the past 240 hour(s))  Surgical pcr screen     Status: None   Collection Time: 06/12/21  8:46 PM   Specimen: Nasal Mucosa; Nasal Swab  Result Value Ref Range Status   MRSA, PCR NEGATIVE NEGATIVE Final   Staphylococcus aureus NEGATIVE NEGATIVE Final    Comment: (NOTE) The Xpert SA Assay (FDA approved for NASAL specimens in patients 39 years of age and older), is one component of a comprehensive surveillance program. It is not intended to diagnose infection nor to guide or monitor treatment. Performed at [facility], [address], [place], [place] [address]  Radiology Studies: Pelvis Portable  Result Date: 06/13/2021 CLINICAL DATA:  [id number].  Post op pelvis, right hip fx EXAM: PORTABLE PELVIS 1-2 VIEWS COMPARISON:  None Available. FINDINGS: Status post total right hip arthroplasty. Frontal view left hip grossly unremarkable. There is no evidence of pelvic fracture or diastasis. No pelvic bone lesions are seen. Skin staples overlie the right hip. Subcutaneus soft tissue edema and emphysema of the right hip consistent with postsurgical changes. Vascular calcifications. IMPRESSION: Status post total right hip arthroplasty. Electronically Signed   By: Iven Finn M.D.   On: 06/13/2021 17:32   DG Chest 1 View  Result Date: 06/12/2021 CLINICAL DATA:  Fall. EXAM: CHEST  1 VIEW COMPARISON:  None Available. FINDINGS: The lungs are clear without focal pneumonia, edema, pneumothorax or pleural effusion. Interstitial markings are diffusely coarsened with chronic features. The  cardiopericardial silhouette is within normal limits for size. Bones are diffusely demineralized. Telemetry leads overlie the chest. IMPRESSION: No active disease. Electronically Signed   By: Misty Stanley M.D.   On: 06/12/2021 14:06   DG Hip Unilat W or Wo Pelvis 2-3 Views Right  Result Date: 06/12/2021 CLINICAL DATA:  Missed a step.  Fall. EXAM: DG HIP (WITH OR WITHOUT PELVIS) 2-3V RIGHT COMPARISON:  None Available. FINDINGS: Three view study. Right femoral neck fracture noted with varus angulation. SI joints and pubis symphysis unremarkable. No evidence for superior or inferior pubic ramus fracture. No worrisome lytic or sclerotic osseous abnormality. IMPRESSION: Right femoral neck fracture with varus angulation. Electronically Signed   By: Misty Stanley M.D.   On: 06/12/2021 14:06   DG Ankle Complete Right  Result Date: 06/12/2021 CLINICAL DATA:  Patient missed a step and injured right ankle. Pain. EXAM: RIGHT ANKLE - COMPLETE 3+ VIEW COMPARISON:  None Available. FINDINGS: There is no evidence of fracture, dislocation, or joint effusion. There is no evidence of arthropathy or other focal bone abnormality. Soft tissues are unremarkable. IMPRESSION: Negative. Electronically Signed   By: Misty Stanley M.D.   On: 06/12/2021 14:05   CT Head Wo Contrast  Result Date: 06/12/2021 CLINICAL DATA:  Moderate to severe head trauma EXAM: CT HEAD WITHOUT CONTRAST CT CERVICAL SPINE WITHOUT CONTRAST TECHNIQUE: Multidetector CT imaging of the head and cervical spine was performed following the standard protocol without intravenous contrast. Multiplanar CT image reconstructions of the cervical spine were also generated. RADIATION DOSE REDUCTION: This exam was performed according to the departmental dose-optimization program which includes automated exposure control, adjustment of the mA and/or kV according to patient size and/or use of iterative reconstruction technique. COMPARISON:  None Available. FINDINGS: CT HEAD  FINDINGS Brain: No acute intracranial hemorrhage. No focal mass lesion. No CT evidence of acute infarction. No midline shift or mass effect. No hydrocephalus. Basilar cisterns are patent. There are periventricular and subcortical white matter hypodensities. Generalized cortical atrophy. Vascular: No hyperdense vessel or unexpected calcification. Skull: Normal. Negative for fracture or focal lesion. Sinuses/Orbits: Paranasal sinuses and mastoid air cells are clear. Orbits are clear. Other: None. CT CERVICAL SPINE FINDINGS Alignment: Normal alignment of the cervical vertebral bodies. Skull base and vertebrae: Normal craniocervical junction. No loss of vertebral body height or disc height. Normal facet articulation. No evidence of fracture. Soft tissues and spinal canal: No prevertebral soft tissue swelling. No perispinal or epidural hematoma. Disc levels: Endplates spurring and joint space narrowing at C5-C6 and C6-C7. No acute findings. Upper chest: Clear Other: Nuchal calcifications posterior to C6. IMPRESSION: 1. No acute intracranial findings. 2. No cervical spine  fracture. 3. Multilevel disc osteophytic disease. Electronically Signed   By: Suzy Bouchard M.D.   On: 06/12/2021 13:53   CT Cervical Spine Wo Contrast  Result Date: 06/12/2021 CLINICAL DATA:  Moderate to severe head trauma EXAM: CT HEAD WITHOUT CONTRAST CT CERVICAL SPINE WITHOUT CONTRAST TECHNIQUE: Multidetector CT imaging of the head and cervical spine was performed following the standard protocol without intravenous contrast. Multiplanar CT image reconstructions of the cervical spine were also generated. RADIATION DOSE REDUCTION: This exam was performed according to the departmental dose-optimization program which includes automated exposure control, adjustment of the mA and/or kV according to patient size and/or use of iterative reconstruction technique. COMPARISON:  None Available. FINDINGS: CT HEAD FINDINGS Brain: No acute intracranial  hemorrhage. No focal mass lesion. No CT evidence of acute infarction. No midline shift or mass effect. No hydrocephalus. Basilar cisterns are patent. There are periventricular and subcortical white matter hypodensities. Generalized cortical atrophy. Vascular: No hyperdense vessel or unexpected calcification. Skull: Normal. Negative for fracture or focal lesion. Sinuses/Orbits: Paranasal sinuses and mastoid air cells are clear. Orbits are clear. Other: None. CT CERVICAL SPINE FINDINGS Alignment: Normal alignment of the cervical vertebral bodies. Skull base and vertebrae: Normal craniocervical junction. No loss of vertebral body height or disc height. Normal facet articulation. No evidence of fracture. Soft tissues and spinal canal: No prevertebral soft tissue swelling. No perispinal or epidural hematoma. Disc levels: Endplates spurring and joint space narrowing at C5-C6 and C6-C7. No acute findings. Upper chest: Clear Other: Nuchal calcifications posterior to C6. IMPRESSION: 1. No acute intracranial findings. 2. No cervical spine fracture. 3. Multilevel disc osteophytic disease. Electronically Signed   By: Suzy Bouchard M.D.   On: 06/12/2021 13:53    Scheduled Meds:  acetaminophen  500 mg Oral Q6H   atorvastatin  20 mg Oral Daily   azelastine  1 spray Each Nare BID   Chlorhexidine Gluconate Cloth  6 each Topical Daily   clonazePAM  0.5 mg Oral TID   clotrimazole-betamethasone  1 application. Topical Daily   docusate sodium  100 mg Oral BID   enoxaparin (LOVENOX) injection  40 mg Subcutaneous Q24H   feeding supplement  237 mL Oral TID BM   folic acid  2 mg Oral Daily   gabapentin  100 mg Oral BID   ketotifen  1 drop Both Eyes BID   levothyroxine  100 mcg Oral Daily   lisinopril  5 mg Oral BID   [START ON 06/16/2021] methotrexate  12.5 mg Oral Weekly   multivitamin with minerals  1 tablet Oral Daily   pantoprazole  40 mg Oral q morning   Continuous Infusions:  sodium chloride 75 mL/hr at  06/12/21 2316   tranexamic acid       LOS: 2 days    Time spent: 35 mins   Amram Maya, MD [facility]   If 7PM-7AM, please contact night-coverage

## 2021-06-14 NOTE — Progress Notes (Signed)
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  Ongoing  GOAL:   Patient will meet greater than or equal to 90% of their needs  Progressing   MONITOR:   PO intake, Supplement acceptance, Diet advancement  REASON FOR ASSESSMENT:   Malnutrition Screening Tool    ASSESSMENT:   Pt with medical history significant of hypertension, hyperlipidemia, TIA, GERD, hypothyroidism, anxiety, BPH, anemia, rheumatoid arthritis, essential tremor, who presents with fall and right hip pain.  6/7- s/p Procedure(s): ARTHROPLASTY BIPOLAR HIP (HEMIARTHROPLASTY)  Reviewed I/O's: -225 ml x 24 hours and -942 ml since admission  UOP: 1.9 L x 24 hours  Pt underwent surgery yesterday. Pt has been advanced to a heart healthy diet. No meal completion data currently available to assess at this time. RD will liberalize diet to help promote adequate oral intake given malnutrition. He is accepting Ensure supplements.   Medications reviewed and include colace and folic acid.   Labs reviewed.   Diet Order:   Diet Order             Diet regular Room service appropriate? Yes; Fluid consistency: Thin  Diet effective now                   EDUCATION NEEDS:   No education needs have been identified at this time  Skin:  Skin Assessment: Skin Integrity Issues: Skin Integrity Issues:: Incisions Incisions: closed rt hip  Last BM:  06/12/21  Height:   Ht Readings from Last 1 Encounters:  06/13/21 5\' 6"  (1.676 m)    Weight:   Wt Readings from Last 1  Encounters:  06/13/21 66.3 kg    Ideal Body Weight:  64.5 kg  BMI:  Body mass index is 23.59 kg/m.  Estimated Nutritional Needs:   Kcal:  1750-1950  Protein:  90-105 grams  Fluid:  > 1.7 L    Levada Schilling, RD, LDN, CDCES Registered Dietitian II Certified Diabetes Care and Education Specialist Please refer to [facility] for RD and/or RD on-call/weekend/after hours pager

## 2021-06-14 NOTE — Progress Notes (Signed)
Physical Therapy Treatment Patient Details Name: Mitchell Ayers MRN: [id number] DOB: [birth year] Today's Date: 06/14/2021   History of Present Illness 81 y.o. male presents to ED s/p fall resulting in right femoral neck fracture. Medical history significant for hypertension, hyperlipidemia, TIA, GERD, hypothyroidism, anxiety, BPH, anemia, rheumatoid arthritis, essential tremor.    PT Comments    Pt received sitting in recliner, agreeable to therapy. Supportive daughter in room. Pt appears drowsy with eyes drifting closed; pt states he is tired and would like to return to bed. He is motivated and asks about ambulation; PT educated pt on safety due to fatigue and drowsiness, states plan of assisting transfer back to bed and teaching exercises in THA packet. Pt able to complete all exercises; he did require occasional stimulation for improved alertness. Will plan to ambulate next session when pt is more alert. Continue to recommend SNF as pt has 6 STE home and is not safe to attempt stairs at this time. Would benefit from skilled PT to address above deficits and promote optimal return to PLOF.    Recommendations for follow up therapy are one component of a multi-disciplinary discharge planning process, led by the attending physician.  Recommendations may be updated based on patient status, additional functional criteria and insurance authorization.  Follow Up Recommendations  Skilled nursing-short term rehab (<3 hours/day)     Assistance Recommended at Discharge Frequent or constant Supervision/Assistance  Patient can return home with the following A little help with walking and/or transfers;A lot of help with bathing/dressing/bathroom;Assistance with cooking/housework;Assist for transportation;Help with stairs or ramp for entrance   Equipment Recommendations  Other (comment) (TBD at next venue of care)    Recommendations for Other Services       Precautions / Restrictions  Precautions Precautions: Posterior Hip Precaution Booklet Issued: Yes (comment) Restrictions Weight Bearing Restrictions: Yes RLE Weight Bearing: Weight bearing as tolerated     Mobility  Bed Mobility Overal bed mobility: Needs Assistance Bed Mobility: Sit to Supine       Sit to supine: Mod assist   General bed mobility comments: MOD A to manage BLE into bed    Transfers Overall transfer level: Needs assistance Equipment used: Rolling walker (2 wheels) Transfers: Sit to/from Stand Sit to Stand: Min guard           General transfer comment: From recliner, CGA to steady    Ambulation/Gait Ambulation/Gait assistance: Min guard Gait Distance (Feet): 3 Feet Assistive device: Rolling walker (2 wheels) Gait Pattern/deviations: Step-to pattern, Decreased weight shift to right, Decreased stance time - right Gait velocity: decreased     General Gait Details: 73ft from recliner to EOB. Did take a few sidesteps at EOB. Continues to have difficulty with retro-stepping to ensure proximity to sitting surface.   Stairs             Wheelchair Mobility    Modified Rankin (Stroke Patients Only)       Balance Overall balance assessment: Needs assistance   Sitting balance-Leahy Scale: Good       Standing balance-Leahy Scale: Poor Standing balance comment: Reliant on UE support of RW. CGA on occasion during ambulation due to minimal left foot clearance with stepping and tactile cuing to weight shift forward when stepping backwards to sitting surface                            Cognition Arousal/Alertness: Lethargic Behavior During Therapy: [facility] for tasks assessed/performed Overall Cognitive  Status: Within Functional Limits for tasks assessed                                 General Comments: drowsey, eyes drifting closed sitting in recliner and during therex.        Exercises Total Joint Exercises Ankle Circles/Pumps: AROM,  Strengthening, Right, 20 reps, Supine Quad Sets: AROM, Strengthening, Right, 10 reps, Supine Gluteal Sets: AROM, Strengthening, Both, 10 reps, Supine Towel Squeeze: AROM, Strengthening, Both, 10 reps, Supine Short Arc Quad: AROM, Strengthening, Right, 10 reps, Supine Heel Slides: AROM, Strengthening, Right, 10 reps, Supine Hip ABduction/ADduction: AROM, Strengthening, Right, 10 reps, Supine Long Arc Quad: AROM, Right, Strengthening, 20 reps, Seated Other Exercises Other Exercises: Reviewed precautions and WB status with both pt and daughter. Encouraged performance of therapeutic exercise packet this evening and 2-3x/day moving forward.    General Comments        Pertinent Vitals/Pain Pain Assessment Pain Assessment: Faces Pain Score: 5  Faces Pain Scale: Hurts even more Pain Location: right hip Pain Descriptors / Indicators: Aching, Sharp, Grimacing Pain Intervention(s): Limited activity within patient's tolerance, Monitored during session, Premedicated before session, Repositioned    Home Living Family/patient expects to be discharged to:: Private residence Living Arrangements: Spouse/significant other;Children Available Help at Discharge: Family;Available 24 hours/day Type of Home: House Home Access: Stairs to enter Entrance Stairs-Rails: Doctor, general practiceight;Left Entrance Stairs-Number of Steps: 6   Home Layout: One level Home Equipment: Agricultural consultantolling Walker (2 wheels) Additional Comments: Owns RW; does not use at baseline.    Prior Function            PT Goals (current goals can now be found in the care plan section) Acute Rehab PT Goals Patient Stated Goal: to return to fully independent PT Goal Formulation: With patient Time For Goal Achievement: 06/28/21 Potential to Achieve Goals: Good    Frequency    BID      PT Plan      Co-evaluation              AM-PAC PT "6 Clicks" Mobility   Outcome Measure  Help needed turning from your back to your side while in a flat  bed without using bedrails?: A Little Help needed moving from lying on your back to sitting on the side of a flat bed without using bedrails?: A Lot Help needed moving to and from a bed to a chair (including a wheelchair)?: A Little Help needed standing up from a chair using your arms (e.g., wheelchair or bedside chair)?: A Little Help needed to walk in hospital room?: A Little Help needed climbing 3-5 steps with a railing? : Total 6 Click Score: 15    End of Session Equipment Utilized During Treatment: Gait belt Activity Tolerance: Patient tolerated treatment well;Patient limited by pain Patient left: in bed;with bed alarm set;with family/visitor present Nurse Communication: Mobility status;Precautions PT Visit Diagnosis: Unsteadiness on feet (R26.81);Other abnormalities of gait and mobility (R26.89);History of falling (Z91.81);Muscle weakness (generalized) (M62.81);Difficulty in walking, not elsewhere classified (R26.2)     Time: [id number] PT Time Calculation (min) (ACUTE ONLY): 35 min  Charges:  $Therapeutic Exercise: 8-22 mins $Therapeutic Activity: 8-22 mins                    Basilia JumboKerri Miri Jose PT, DPT

## 2021-06-14 NOTE — Progress Notes (Signed)
Subjective:  Patient reports pain as relatively well controlled.  Patient is sitting up in the bedside chair eating lunch.  Objective:   VITALS:   Vitals:   06/13/21 1744 06/13/21 2027 06/14/21 0417 06/14/21 0746  BP: (!) 114/55 (!) 98/53 (!) 122/56 (!) 106/52  Pulse: 73 81 80 75  Resp: Temp: (!) 97.5 F (36.4 C) 98 F (36.7 C) 98.3 F (36.8 C) 99.5 F (37.5 C)  TempSrc:      SpO2: 93% 98% 95% 95%  Weight:      Height:        PHYSICAL EXAM: General: Alert, comfortable Extremity: Dressing is clean and dry, right lower extremity is grossly neurovascularly intact  LABS  Results for orders placed or performed during the hospital encounter of 06/12/21 (from the past 24 hour(s))  CBC     Status: Abnormal   Collection Time: 06/14/21  5:18 AM  Result Value Ref Range   WBC 11.6 (H) 4.0 - 10.5 K/uL   RBC 3.02 (L) 4.22 - 5.81 MIL/uL   Hemoglobin 9.1 (L) 13.0 - 17.0 g/dL   HCT 19.1 (L) 47.8 - 29.5 %   MCV 96.7 80.0 - 100.0 fL   MCH 30.1 26.0 - 34.0 pg   MCHC 31.2 30.0 - 36.0 g/dL   RDW 62.1 30.8 - 65.7 %   Platelets 189 150 - 400 K/uL   nRBC 0.0 0.0 - 0.2 %  Magnesium     Status: None   Collection Time: 06/14/21  5:18 AM  Result Value Ref Range   Magnesium 1.8 1.7 - 2.4 mg/dL  Basic metabolic panel     Status: Abnormal   Collection Time: 06/14/21  5:18 AM  Result Value Ref Range   Sodium 135 135 - 145 mmol/L   Potassium 3.9 3.5 - 5.1 mmol/L   Chloride 106 98 - 111 mmol/L   CO2 26 22 - 32 mmol/L   Glucose, Bld 124 (H) 70 - 99 mg/dL   BUN 17 8 - 23 mg/dL   Creatinine, Ser 8.46 0.61 - 1.24 mg/dL   Calcium 7.9 (L) 8.9 - 10.3 mg/dL   GFR, Estimated >96 >29 mL/min   Anion gap 3 (L) 5 - 15  Phosphorus     Status: None   Collection Time: 06/14/21  5:18 AM  Result Value Ref Range   Phosphorus 2.6 2.5 - 4.6 mg/dL    Pelvis Portable  Result Date: 06/13/2021 CLINICAL DATA:  [id number].  Post op pelvis, right hip fx EXAM: PORTABLE PELVIS 1-2 VIEWS COMPARISON:   None Available. FINDINGS: Status post total right hip arthroplasty. Frontal view left hip grossly unremarkable. There is no evidence of pelvic fracture or diastasis. No pelvic bone lesions are seen. Skin staples overlie the right hip. Subcutaneus soft tissue edema and emphysema of the right hip consistent with postsurgical changes. Vascular calcifications. IMPRESSION: Status post total right hip arthroplasty. Electronically Signed   By: Tish Frederickson M.D.   On: 06/13/2021 17:32   DG Chest 1 View  Result Date: 06/12/2021 CLINICAL DATA:  Fall. EXAM: CHEST  1 VIEW COMPARISON:  None Available. FINDINGS: The lungs are clear without focal pneumonia, edema, pneumothorax or pleural effusion. Interstitial markings are diffusely coarsened with chronic features. The cardiopericardial silhouette is within normal limits for size. Bones are diffusely demineralized. Telemetry leads overlie the chest. IMPRESSION: No active disease. Electronically Signed   By: Kennith Center M.D.   On: 06/12/2021 14:06   DG Hip Unilat  W or Wo Pelvis 2-3 Views Right  Result Date: 06/12/2021 CLINICAL DATA:  Missed a step.  Fall. EXAM: DG HIP (WITH OR WITHOUT PELVIS) 2-3V RIGHT COMPARISON:  None Available. FINDINGS: Three view study. Right femoral neck fracture noted with varus angulation. SI joints and pubis symphysis unremarkable. No evidence for superior or inferior pubic ramus fracture. No worrisome lytic or sclerotic osseous abnormality. IMPRESSION: Right femoral neck fracture with varus angulation. Electronically Signed   By: Kennith CenterEric  Mansell M.D.   On: 06/12/2021 14:06   DG Ankle Complete Right  Result Date: 06/12/2021 CLINICAL DATA:  Patient missed a step and injured right ankle. Pain. EXAM: RIGHT ANKLE - COMPLETE 3+ VIEW COMPARISON:  None Available. FINDINGS: There is no evidence of fracture, dislocation, or joint effusion. There is no evidence of arthropathy or other focal bone abnormality. Soft tissues are unremarkable. IMPRESSION:  Negative. Electronically Signed   By: Kennith CenterEric  Mansell M.D.   On: 06/12/2021 14:05   CT Head Wo Contrast  Result Date: 06/12/2021 CLINICAL DATA:  Moderate to severe head trauma EXAM: CT HEAD WITHOUT CONTRAST CT CERVICAL SPINE WITHOUT CONTRAST TECHNIQUE: Multidetector CT imaging of the head and cervical spine was performed following the standard protocol without intravenous contrast. Multiplanar CT image reconstructions of the cervical spine were also generated. RADIATION DOSE REDUCTION: This exam was performed according to the departmental dose-optimization program which includes automated exposure control, adjustment of the mA and/or kV according to patient size and/or use of iterative reconstruction technique. COMPARISON:  None Available. FINDINGS: CT HEAD FINDINGS Brain: No acute intracranial hemorrhage. No focal mass lesion. No CT evidence of acute infarction. No midline shift or mass effect. No hydrocephalus. Basilar cisterns are patent. There are periventricular and subcortical white matter hypodensities. Generalized cortical atrophy. Vascular: No hyperdense vessel or unexpected calcification. Skull: Normal. Negative for fracture or focal lesion. Sinuses/Orbits: Paranasal sinuses and mastoid air cells are clear. Orbits are clear. Other: None. CT CERVICAL SPINE FINDINGS Alignment: Normal alignment of the cervical vertebral bodies. Skull base and vertebrae: Normal craniocervical junction. No loss of vertebral body height or disc height. Normal facet articulation. No evidence of fracture. Soft tissues and spinal canal: No prevertebral soft tissue swelling. No perispinal or epidural hematoma. Disc levels: Endplates spurring and joint space narrowing at C5-C6 and C6-C7. No acute findings. Upper chest: Clear Other: Nuchal calcifications posterior to C6. IMPRESSION: 1. No acute intracranial findings. 2. No cervical spine fracture. 3. Multilevel disc osteophytic disease. Electronically Signed   By: Genevive BiStewart  Edmunds  M.D.   On: 06/12/2021 13:53   CT Cervical Spine Wo Contrast  Result Date: 06/12/2021 CLINICAL DATA:  Moderate to severe head trauma EXAM: CT HEAD WITHOUT CONTRAST CT CERVICAL SPINE WITHOUT CONTRAST TECHNIQUE: Multidetector CT imaging of the head and cervical spine was performed following the standard protocol without intravenous contrast. Multiplanar CT image reconstructions of the cervical spine were also generated. RADIATION DOSE REDUCTION: This exam was performed according to the departmental dose-optimization program which includes automated exposure control, adjustment of the mA and/or kV according to patient size and/or use of iterative reconstruction technique. COMPARISON:  None Available. FINDINGS: CT HEAD FINDINGS Brain: No acute intracranial hemorrhage. No focal mass lesion. No CT evidence of acute infarction. No midline shift or mass effect. No hydrocephalus. Basilar cisterns are patent. There are periventricular and subcortical white matter hypodensities. Generalized cortical atrophy. Vascular: No hyperdense vessel or unexpected calcification. Skull: Normal. Negative for fracture or focal lesion. Sinuses/Orbits: Paranasal sinuses and mastoid air cells are clear. Orbits  are clear. Other: None. CT CERVICAL SPINE FINDINGS Alignment: Normal alignment of the cervical vertebral bodies. Skull base and vertebrae: Normal craniocervical junction. No loss of vertebral body height or disc height. Normal facet articulation. No evidence of fracture. Soft tissues and spinal canal: No prevertebral soft tissue swelling. No perispinal or epidural hematoma. Disc levels: Endplates spurring and joint space narrowing at C5-C6 and C6-C7. No acute findings. Upper chest: Clear Other: Nuchal calcifications posterior to C6. IMPRESSION: 1. No acute intracranial findings. 2. No cervical spine fracture. 3. Multilevel disc osteophytic disease. Electronically Signed   By: Genevive Bi M.D.   On: 06/12/2021 13:53     Assessment/Plan: 1 Day Post-Op  Status post right hip hemiarthroplasty  -Appreciate hospitalist team support -PT/OT: WBAT with posterior hip precautions x3 months -Okay to start DVT prophylaxis, Lovenox postop day 1 -Dressing changes as needed to keep dressing clean and dry -Follow-up in clinic in 2 weeks for staple removal and x-rays    Ross Marcus , MD 06/14/2021, 1:02 PM

## 2021-06-14 NOTE — TOC Initial Note (Signed)
Transition of Care [facility]) - Initial/Assessment Note    Patient Details  Name: Mitchell Ayers MRN: [id number] Date of Birth: 03-Apr-1940  Transition of [facility]) CM/SW Contact:    Maree Krabbe, LCSW Phone Number: 06/14/2021, 2:27 PM  Clinical Narrative:   CSW spoke with pt's daughter [name] and she states [facility] prefer [facility]. [facility] and they have started insurance auth.                 Expected Discharge Plan: Skilled Nursing Facility Barriers to Discharge: Continued Medical Work up   Patient Goals and CMS Choice        Expected Discharge Plan and Services Expected Discharge Plan: Skilled Nursing Facility In-house Referral: Clinical Social Work   Post Acute Care Choice: Skilled Nursing Facility Living arrangements for the past 2 months: Single Family Home                                      Prior Living Arrangements/Services Living arrangements for the past 2 months: Single Family Home Lives with:: Spouse Patient language and need for interpreter reviewed:: Yes Do you feel safe going back to the place where you live?: Yes      Need for Family Participation in Patient Care: Yes (Comment) Care giver support system in place?: Yes (comment)   Criminal Activity/Legal Involvement Pertinent to Current Situation/Hospitalization: No - Comment as needed  Activities of Daily Living Home Assistive Devices/Equipment: None ADL Screening (condition at time of admission) Patient's cognitive ability adequate to safely complete daily activities?: Yes Is the patient deaf or have difficulty hearing?: No Does the patient have difficulty seeing, even when wearing glasses/contacts?: No Does the patient have difficulty concentrating, remembering, or making decisions?: No Patient able to express need for assistance with ADLs?: Yes Does the patient have difficulty dressing or bathing?: No Independently performs ADLs?: Yes (appropriate for developmental age) Does the patient  have difficulty walking or climbing stairs?: No Weakness of Legs: None Weakness of Arms/Hands: None  Permission Sought/Granted Permission sought to share information with : Family Supports Permission granted to share information with : Yes, Release of Information Signed  Share Information with NAME: [name]  Permission granted to share info w AGENCY: [facility]  Permission granted to share info w Relationship: daughter     Emotional Assessment Appearance:: Appears stated age Attitude/Demeanor/Rapport: Engaged Affect (typically observed): Accepting Orientation: : Oriented to Self, Oriented to Place, Oriented to  Time, Oriented to Situation Alcohol / Substance Use: Not Applicable Psych Involvement: No (comment)  Admission diagnosis:  Fracture of femoral neck, right, closed (HCC) [S72.001A] Patient Active Problem List   Diagnosis Date Noted   Malnutrition of moderate degree 06/13/2021   Fracture of femoral neck, right, closed (HCC) 06/12/2021   HTN (hypertension) 06/12/2021   HLD (hyperlipidemia) 06/12/2021   Hypothyroid 06/12/2021   Anxiety 06/12/2021   Normocytic anemia 06/12/2021   RA (rheumatoid arthritis) (HCC) 06/12/2021   Fall at home, initial encounter 06/12/2021   TIA (transient ischemic attack) 06/12/2021   PCP:  [facility]:   [facility] [address] [place] - [address] [place] [address] Phone: [phone] Fax: [phone]     Social Determinants of Health (SDOH) Interventions    Readmission Risk Interventions     No data to display

## 2021-06-15 DIAGNOSIS — S72001A Fracture of unspecified part of neck of right femur, initial encounter for closed fracture: Secondary | ICD-10-CM | POA: Diagnosis not present

## 2021-06-15 LAB — CBC
HCT: 27.9 % — ABNORMAL LOW (ref 39.0–52.0)
Hemoglobin: 8.8 g/dL — ABNORMAL LOW (ref 13.0–17.0)
MCH: 30.4 pg (ref 26.0–34.0)
MCHC: 31.5 g/dL (ref 30.0–36.0)
MCV: 96.5 fL (ref 80.0–100.0)
Platelets: 187 10*3/uL (ref 150–400)
RBC: 2.89 MIL/uL — ABNORMAL LOW (ref 4.22–5.81)
RDW: 14.5 % (ref 11.5–15.5)
WBC: 11.2 10*3/uL — ABNORMAL HIGH (ref 4.0–10.5)
nRBC: 0 % (ref 0.0–0.2)

## 2021-06-15 LAB — SURGICAL PATHOLOGY

## 2021-06-15 NOTE — Progress Notes (Signed)
Physical Therapy Treatment Patient Details Name: Mitchell Ayers MRN: [id number] DOB: August 05, 1940 Today's Date: 06/15/2021   History of Present Illness 81 y.o. male presents to ED s/p fall resulting in right femoral neck fracture. Medical history significant for hypertension, hyperlipidemia, TIA, GERD, hypothyroidism, anxiety, BPH, anemia, rheumatoid arthritis, essential tremor.    PT Comments    Pt received sitting in recliner, agreeable to therapy. RN arrives with medications - pt performed seated therapeutic exercises during distribution of meds. STS has improved this date with decreased VC required on set up and improved stability; he remains CGA for safety. Ambulation distance increased to 40ft; gait pattern remains similar with deficits in left foot clearance, right stance time and right knee remaining flexed during WB. Frequent VC on upright posture to assist with triceps activation of UE support on RW. Would benefit from skilled PT to address above deficits and promote optimal return to PLOF.    Recommendations for follow up therapy are one component of a multi-disciplinary discharge planning process, led by the attending physician.  Recommendations may be updated based on patient status, additional functional criteria and insurance authorization.  Follow Up Recommendations  Skilled nursing-short term rehab (<3 hours/day)     Assistance Recommended at Discharge Frequent or constant Supervision/Assistance  Patient can return home with the following A little help with walking and/or transfers;A lot of help with bathing/dressing/bathroom;Assistance with cooking/housework;Assist for transportation;Help with stairs or ramp for entrance   Equipment Recommendations  Rolling walker (2 wheels);BSC/3in1    Recommendations for Other Services       Precautions / Restrictions Precautions Precautions: Posterior Hip Precaution Booklet Issued: Yes (comment) Restrictions Weight Bearing  Restrictions: Yes RLE Weight Bearing: Weight bearing as tolerated     Mobility  Bed Mobility               General bed mobility comments: not assessed    Transfers Overall transfer level: Needs assistance Equipment used: Rolling walker (2 wheels) Transfers: Sit to/from Stand Sit to Stand: Min guard           General transfer comment: CGA for safety; no steadying required    Ambulation/Gait Ambulation/Gait assistance: Min guard Gait Distance (Feet): 40 Feet Assistive device: Rolling walker (2 wheels) Gait Pattern/deviations: Step-to pattern, Decreased weight shift to right, Decreased stance time - right Gait velocity: decreased     General Gait Details: 60ft using RW; difficulty with left foot clearance; did become very fatigued in final 84ft   Stairs             Wheelchair Mobility    Modified Rankin (Stroke Patients Only)       Balance Overall balance assessment: Needs assistance   Sitting balance-Leahy Scale: Good     Standing balance support: Bilateral upper extremity supported Standing balance-Leahy Scale: Poor Standing balance comment: Reliant on UE support of RW. Attempted to lift BUE to wipe his nose; unsafe and performed with one hand                            Cognition Arousal/Alertness: Awake/alert Behavior During Therapy: WFL for tasks assessed/performed Overall Cognitive Status: Within Functional Limits for tasks assessed                                          Exercises Total Joint Exercises Ankle Circles/Pumps: AROM, Strengthening, Right, 20  reps, Supine Gluteal Sets: AROM, Strengthening, Both, 10 reps, Supine Long Arc Quad: AROM, Right, Strengthening, Seated, 10 reps    General Comments        Pertinent Vitals/Pain Pain Assessment Pain Assessment: 0-10 Pain Score: 3  Pain Location: right hip Pain Descriptors / Indicators: Aching    Home Living                           Prior Function            PT Goals (current goals can now be found in the care plan section) Acute Rehab PT Goals Patient Stated Goal: to return to fully independent PT Goal Formulation: With patient Time For Goal Achievement: 06/28/21 Potential to Achieve Goals: Good    Frequency    BID      PT Plan      Co-evaluation              AM-PAC PT "6 Clicks" Mobility   Outcome Measure  Help needed turning from your back to your side while in a flat bed without using bedrails?: A Little Help needed moving from lying on your back to sitting on the side of a flat bed without using bedrails?: A Lot Help needed moving to and from a bed to a chair (including a wheelchair)?: A Little Help needed standing up from a chair using your arms (e.g., wheelchair or bedside chair)?: A Little Help needed to walk in hospital room?: A Little Help needed climbing 3-5 steps with a railing? : Total 6 Click Score: 15    End of Session Equipment Utilized During Treatment: Gait belt Activity Tolerance: Patient tolerated treatment well;Patient limited by fatigue Patient left: in chair;with chair alarm set;with nursing/sitter in room Nurse Communication: Mobility status;Precautions PT Visit Diagnosis: Unsteadiness on feet (R26.81);Other abnormalities of gait and mobility (R26.89);History of falling (Z91.81);Muscle weakness (generalized) (M62.81);Difficulty in walking, not elsewhere classified (R26.2)     Time: 2130-8657 PT Time Calculation (min) (ACUTE ONLY): 34 min  Charges:  $Therapeutic Exercise: 8-22 mins $Therapeutic Activity: 8-22 mins                      Basilia Jumbo PT, DPT

## 2021-06-15 NOTE — Progress Notes (Signed)
PROGRESS NOTE    Mitchell Ayers  WJX:[id number] DOB: 04-03-1940 DOA: 06/12/2021 PCP: Marlan Palau   Brief Narrative: This 81 years old male with PMH significant for hypertension, hyperlipidemia, TIA, GERD, hypothyroidism, anxiety, BPH, anemia, rheumatoid arthritis, essential tremor presented s/p fall with right hip pain.  Patient report he was stepping down off a step stool this afternoon missing the last exam and fell on his right side and developed right hip pain.  Patient denies any loss of consciousness.  Work-up in the ED shows right femoral neck fracture. Ortho is consulted.  Patient underwent ORIF , tolerated well, post operative day # 2.  PT recommended SNF, awaiting insurance authorization.  Assessment & Plan:   Principal Problem:   Fracture of femoral neck, right, closed (HCC) Active Problems:   Fall at home, initial encounter   HTN (hypertension)   HLD (hyperlipidemia)   Hypothyroid   Anxiety   Normocytic anemia   RA (rheumatoid arthritis) (HCC)   TIA (transient ischemic attack)   Malnutrition of moderate degree  Fracture of femoral neck, right , closed: Patient presented status post fall.  X-ray showed right femoral neck fracture. Orthopedic consulted.  Patient underwent ORIF.  Postoperative day 2 Continue adequate pain control with morphine, Percocet ,Tylenol as needed Continue Zofran as needed for nausea. Continue Robaxin for muscle spasm. PT and OT when able to after surgery. Ortho recommended Lovenox daily for DVT prophylaxis, weightbearing as tolerated with posterior hip precautions. PT recommended SNF , awaiting insurance authorization.  History of TIA: Continue Lipitor 20 mg daily.  Rheumatoid arthritis: Continue home methotrexate 12.5 mg weekly.  Normocytic anemia: H&H is stable.  Follow-up CBC.  Generalized anxiety: Continue Klonopin 0.5 mg tid  Hypothyroidism: Continue Synthroid 100 mg daily  Hyperlipidemia: Continue Lipitor 20 mg  daily  Essential hypertension: Continue Lisinopril 5 mg daily   DVT prophylaxis: SCDs Code Status: Full code. Family Communication: No family at bed side. Disposition Plan:   Status is: Inpatient Remains inpatient appropriate because: Admitted s/p fall with right femoral fracture. Patient underwent ORIF, postoperative day 2 , tolerated well.  PT recommended SNF awaiting insurance Auth.    Consultants:  Orthopedics  Procedures: ORIF Antimicrobials: Anti-infectives (From admission, onward)    Start     Dose/Rate Route Frequency Ordered Stop   06/13/21 2000  ceFAZolin (ANCEF) IVPB 2g/100 mL premix        2 g 200 mL/hr over 30 Minutes Intravenous Every 6 hours 06/13/21 1738 06/14/21 0536   06/13/21 1433  vancomycin (VANCOCIN) powder  Status:  Discontinued          As needed 06/13/21 1433 06/13/21 1601   06/13/21 1400  ceFAZolin (ANCEF) IVPB 2g/100 mL premix        2 g 200 mL/hr over 30 Minutes Intravenous To Surgery 06/13/21 0741 06/13/21 1409   06/13/21 1226  ceFAZolin (ANCEF) 2-4 GM/100ML-% IVPB       Note to Pharmacy: Register, Clydie Braun A: cabinet override      06/13/21 1226 06/13/21 1434        Subjective: Patient was seen and examined at bedside.  Overnight events noted.   Patient reports feeling much improved. He is status post ORIF,  Postoperative day 2. Patient was sitting comfortably on the chair, having breakfast and states he feels much improved.  Objective: Vitals:   06/14/21 2103 06/15/21 0437 06/15/21 0745 06/15/21 1203  BP: (!) 131/59 132/64 (!) 117/59 106/62  Pulse: 82 76 78 83  Resp: 18  Temp: 98.1 F (36.7 C) 98.9 F (37.2 C) 99.2 F (37.3 C) 98.2 F (36.8 C)  TempSrc:  Oral    SpO2: 100% 99% 97% 99%  Weight:      Height:        Intake/Output Summary (Last 24 hours) at 06/15/2021 1358 Last data filed at 06/15/2021 1305 Gross per 24 hour  Intake 240 ml  Output 1100 ml  Net -860 ml   Filed Weights   06/12/21 1312 06/12/21 1909  06/13/21 1226  Weight: 66.3 kg 66.3 kg 66.3 kg    Examination:  General exam: Appears comfortable, not in any acute distress, seems pleasant. Respiratory system: CTA bilaterally, no wheezing, no crackles,  normal respiratory effort. Cardiovascular system: S1-S2 heard, regular rate and rhythm, no murmur. Gastrointestinal system: Abdomen is soft, nontender, nondistended, normal bowel sounds. Central nervous system: Alert and oriented x 3. No focal neurological deficits. Extremities: Mild tenderness noted in the right hip area, dressing noted. Skin: No rashes, lesions or ulcers Psychiatry: Judgement and insight appear normal. Mood & affect appropriate.     Data Reviewed: I have personally reviewed following labs and imaging studies  CBC: Recent Labs  Lab 06/12/21 1323 06/13/21 0445 06/14/21 0518 06/15/21 0504  WBC 7.7 10.4 11.6* 11.2*  NEUTROABS 5.7  --   --   --   HGB 10.6* 9.5* 9.1* 8.8*  HCT 33.3* 30.2* 29.2* 27.9*  MCV 95.7 96.5 96.7 96.5  PLT 285 244 189 187   Basic Metabolic Panel: Recent Labs  Lab 06/12/21 1323 06/14/21 0518  NA 134* 135  K 4.7 3.9  CL 100 106  CO2 29 26  GLUCOSE 112* 124*  BUN 17 17  CREATININE 0.98 1.03  CALCIUM 8.7* 7.9*  MG  --  1.8  PHOS  --  2.6   GFR: Estimated Creatinine Clearance: 49.9 mL/min (by C-G formula based on SCr of 1.03 mg/dL). Liver Function Tests: No results for input(s): "AST", "ALT", "ALKPHOS", "BILITOT", "PROT", "ALBUMIN" in the last 168 hours. No results for input(s): "LIPASE", "AMYLASE" in the last 168 hours. No results for input(s): "AMMONIA" in the last 168 hours. Coagulation Profile: Recent Labs  Lab 06/12/21 2024  INR 1.2   Cardiac Enzymes: No results for input(s): "CKTOTAL", "CKMB", "CKMBINDEX", "TROPONINI" in the last 168 hours. BNP (last 3 results) No results for input(s): "PROBNP" in the last 8760 hours. HbA1C: No results for input(s): "HGBA1C" in the last 72 hours. CBG: No results for input(s):  "GLUCAP" in the last 168 hours. Lipid Profile: No results for input(s): "CHOL", "HDL", "LDLCALC", "TRIG", "CHOLHDL", "LDLDIRECT" in the last 72 hours. Thyroid Function Tests: No results for input(s): "TSH", "T4TOTAL", "FREET4", "T3FREE", "THYROIDAB" in the last 72 hours. Anemia Panel: No results for input(s): "VITAMINB12", "FOLATE", "FERRITIN", "TIBC", "IRON", "RETICCTPCT" in the last 72 hours. Sepsis Labs: No results for input(s): "PROCALCITON", "LATICACIDVEN" in the last 168 hours.  Recent Results (from the past 240 hour(s))  Surgical pcr screen     Status: None   Collection Time: 06/12/21  8:46 PM   Specimen: Nasal Mucosa; Nasal Swab  Result Value Ref Range Status   MRSA, PCR NEGATIVE NEGATIVE Final   Staphylococcus aureus NEGATIVE NEGATIVE Final    Comment: (NOTE) The Xpert SA Assay (FDA approved for NASAL specimens in patients 39 years of age and older), is one component of a comprehensive surveillance program. It is not intended to diagnose infection nor to guide or monitor treatment. Performed at [facility], [address]  [place], [place] [address]     Radiology Studies: Pelvis Portable  Result Date: 06/13/2021 CLINICAL DATA:  [id number].  Post op pelvis, right hip fx EXAM: PORTABLE PELVIS 1-2 VIEWS COMPARISON:  None Available. FINDINGS: Status post total right hip arthroplasty. Frontal view left hip grossly unremarkable. There is no evidence of pelvic fracture or diastasis. No pelvic bone lesions are seen. Skin staples overlie the right hip. Subcutaneus soft tissue edema and emphysema of the right hip consistent with postsurgical changes. Vascular calcifications. IMPRESSION: Status post total right hip arthroplasty. Electronically Signed   By: Tish Frederickson M.D.   On: 06/13/2021 17:32    Scheduled Meds:  atorvastatin  20 mg Oral Daily   azelastine  1 spray Each Nare BID   Chlorhexidine Gluconate Cloth  6 each Topical Daily   clonazePAM  0.5 mg Oral TID    clotrimazole-betamethasone  1 application  Topical Daily   docusate sodium  100 mg Oral BID   enoxaparin (LOVENOX) injection  40 mg Subcutaneous Q24H   feeding supplement  237 mL Oral TID BM   folic acid  2 mg Oral Daily   gabapentin  100 mg Oral BID   ketotifen  1 drop Both Eyes BID   levothyroxine  100 mcg Oral Daily   lisinopril  5 mg Oral BID   [START ON 06/16/2021] methotrexate  12.5 mg Oral Weekly   multivitamin with minerals  1 tablet Oral Daily   pantoprazole  40 mg Oral q morning   Continuous Infusions:  sodium chloride 75 mL/hr at 06/15/21 0626   tranexamic acid       LOS: 3 days    Time spent: 30 mins   Lizvet Chunn, MD [facility]   If 7PM-7AM, please contact night-coverage

## 2021-06-15 NOTE — Progress Notes (Signed)
Physical Therapy Treatment Patient Details Name: Mitchell Ayers MRN: [id number] DOB: 12-11-1940 Today's Date: 06/15/2021   History of Present Illness 81 y.o. male presents to ED s/p fall resulting in right femoral neck fracture. Medical history significant for hypertension, hyperlipidemia, TIA, GERD, hypothyroidism, anxiety, BPH, anemia, rheumatoid arthritis, essential tremor.    PT Comments    Pt received sitting in recliner, daughter in room. Pt states he has been up in recliner all day and buttocks are feeling sore. Pt increased ambulation distance from 29ft this morning to 77ft this afternoon. Gait deficits remain similar with decreased weight shift/stance time on RLE, decreased left foot clearance and poor posture. Frequent VC to lift head. Difficulty with lateral steps to the right and backward ambulation. MOD A required for sit>supine. Pt is motivated as he was preparing to perform HEP packet with his daughter as PT was leaving room. He remains unsafe to attempt stairs at this time. Would benefit from skilled PT to address above deficits and promote optimal return to PLOF.   Recommendations for follow up therapy are one component of a multi-disciplinary discharge planning process, led by the attending physician.  Recommendations may be updated based on patient status, additional functional criteria and insurance authorization.  Follow Up Recommendations  Skilled nursing-short term rehab (<3 hours/day)     Assistance Recommended at Discharge Frequent or constant Supervision/Assistance  Patient can return home with the following A little help with walking and/or transfers;A lot of help with bathing/dressing/bathroom;Assistance with cooking/housework;Assist for transportation;Help with stairs or ramp for entrance   Equipment Recommendations  Rolling walker (2 wheels);BSC/3in1    Recommendations for Other Services       Precautions / Restrictions Precautions Precautions: Posterior  Hip Precaution Booklet Issued: Yes (comment) Restrictions Weight Bearing Restrictions: Yes RLE Weight Bearing: Weight bearing as tolerated     Mobility  Bed Mobility Overal bed mobility: Needs Assistance Bed Mobility: Sit to Supine       Sit to supine: Mod assist   General bed mobility comments: MOD A to manage RLE and guide trunk to surface    Transfers Overall transfer level: Needs assistance Equipment used: Rolling walker (2 wheels) Transfers: Sit to/from Stand, Bed to chair/wheelchair/BSC Sit to Stand: Min guard   Step pivot transfers: Min guard       General transfer comment: CGA to steady with transition of hand from chair to RW; CGA recliner>EOB with VC on lateral and backward stepping    Ambulation/Gait Ambulation/Gait assistance: Min guard Gait Distance (Feet): 60 Feet Assistive device: Rolling walker (2 wheels) Gait Pattern/deviations: Step-to pattern, Decreased weight shift to right, Decreased stance time - right Gait velocity: decreased     General Gait Details: 6ft using RW; difficulty with left foot clearance; frequent VC for upright posture   Stairs             Wheelchair Mobility    Modified Rankin (Stroke Patients Only)       Balance Overall balance assessment: Needs assistance   Sitting balance-Leahy Scale: Good     Standing balance support: Bilateral upper extremity supported Standing balance-Leahy Scale: Poor Standing balance comment: Reliant on UE support of RW; weight shifts onto LLE                            Cognition Arousal/Alertness: Awake/alert Behavior During Therapy: WFL for tasks assessed/performed Overall Cognitive Status: Within Functional Limits for tasks assessed  Exercises Total Joint Exercises Ankle Circles/Pumps: AROM, Strengthening, Right, 20 reps, Supine Gluteal Sets: AROM, Strengthening, Both, 10 reps, Supine Long Arc Quad: AROM,  Right, Strengthening, Seated, 10 reps Other Exercises Other Exercises: Encouraged performance of HEP packet later this date; daughter was assisting pt through exercises upon PT leaving room. Eductaed on DVT risk and encouraged frequent ankle pumps/LE exercises while in bed.    General Comments        Pertinent Vitals/Pain Pain Assessment Pain Assessment: 0-10 Pain Score: 3  Pain Location: right hip Pain Descriptors / Indicators: Aching, Sore    Home Living                          Prior Function            PT Goals (current goals can now be found in the care plan section) Acute Rehab PT Goals Patient Stated Goal: to return to fully independent PT Goal Formulation: With patient Time For Goal Achievement: 06/28/21 Potential to Achieve Goals: Good    Frequency    BID      PT Plan      Co-evaluation              AM-PAC PT "6 Clicks" Mobility   Outcome Measure  Help needed turning from your back to your side while in a flat bed without using bedrails?: A Little Help needed moving from lying on your back to sitting on the side of a flat bed without using bedrails?: A Little Help needed moving to and from a bed to a chair (including a wheelchair)?: A Little Help needed standing up from a chair using your arms (e.g., wheelchair or bedside chair)?: A Little Help needed to walk in hospital room?: A Little Help needed climbing 3-5 steps with a railing? : Total 6 Click Score: 16    End of Session Equipment Utilized During Treatment: Gait belt Activity Tolerance: Patient tolerated treatment well Patient left: in bed;with call bell/phone within reach;with bed alarm set;with family/visitor present Nurse Communication: Mobility status;Precautions PT Visit Diagnosis: Unsteadiness on feet (R26.81);Other abnormalities of gait and mobility (R26.89);History of falling (Z91.81);Muscle weakness (generalized) (M62.81);Difficulty in walking, not elsewhere classified  (R26.2)     Time: 1340-1410 PT Time Calculation (min) (ACUTE ONLY): 30 min  Charges:  $Therapeutic Exercise: 8-22 mins $Therapeutic Activity: 23-37 mins                      Basilia JumboKerri Dellene Mcgroarty PT, DPT

## 2021-06-15 NOTE — Care Management Important Message (Signed)
Important Message  Patient Details  Name: Mitchell Ayers MRN: [id number] Date of Birth: [birth year]   Medicare Important Message Given:  Yes     [name] [name] 06/15/2021, 12:56 PM

## 2021-06-16 DIAGNOSIS — S72001A Fracture of unspecified part of neck of right femur, initial encounter for closed fracture: Secondary | ICD-10-CM | POA: Diagnosis not present

## 2021-06-16 LAB — CBC
HCT: 25.5 % — ABNORMAL LOW (ref 39.0–52.0)
Hemoglobin: 8 g/dL — ABNORMAL LOW (ref 13.0–17.0)
MCH: 30.4 pg (ref 26.0–34.0)
MCHC: 31.4 g/dL (ref 30.0–36.0)
MCV: 97 fL (ref 80.0–100.0)
Platelets: 225 10*3/uL (ref 150–400)
RBC: 2.63 MIL/uL — ABNORMAL LOW (ref 4.22–5.81)
RDW: 14.6 % (ref 11.5–15.5)
WBC: 9.4 10*3/uL (ref 4.0–10.5)
nRBC: 0 % (ref 0.0–0.2)

## 2021-06-16 LAB — HEMOGLOBIN AND HEMATOCRIT, BLOOD
HCT: 29 % — ABNORMAL LOW (ref 39.0–52.0)
Hemoglobin: 9.3 g/dL — ABNORMAL LOW (ref 13.0–17.0)

## 2021-06-16 NOTE — TOC Progression Note (Signed)
Transition of Care [facility]) - Progression Note    Patient Details  Name: Deforrest Hickingbottom MRN: [id number] Date of Birth: [birth year]  Transition of Care [facility]) CM/SW [place], [place] Phone Number: [phone] 06/16/2021, 12:19 PM  Clinical Narrative:     CSW was updated by Tammy with [facility] they are unable to run authorization and will need TOC to run authorization. CSW updated TOC Admin Assistance to run [employer] thru [place].  Auth pending for placement.  Expected Discharge Plan: [place] Barriers to Discharge: Continued Medical Work up  Expected Discharge Plan and Services Expected Discharge Plan: [place] In-house Referral: Clinical Social Work   Post Acute Care Choice: [place] Living arrangements for the past 2 months: Single Family Home                                       Social Determinants of Health (SDOH) Interventions    Readmission Risk Interventions     No data to display

## 2021-06-16 NOTE — Progress Notes (Signed)
PROGRESS NOTE    Mitchell Ayers  [id number] DOB: 1940-08-05 DOA: 06/12/2021 PCP: Dartha Lodge   Brief Narrative: This 81 years old male with PMH significant for hypertension, hyperlipidemia, TIA, GERD, hypothyroidism, anxiety, BPH, anemia, rheumatoid arthritis, essential tremor presented s/p fall with right hip pain.  Patient report he was stepping down off a step stool this afternoon missing the last exam and fell on his right side and developed right hip pain.  Patient denies any loss of consciousness.  Work-up in the ED shows right femoral neck fracture. Ortho is consulted.  Patient underwent ORIF , tolerated well, post operative day # 3.  PT recommended SNF, awaiting insurance authorization.  Assessment & Plan:   Principal Problem:   Fracture of femoral neck, right, closed ([place]) Active Problems:   Fall at home, initial encounter   HTN (hypertension)   HLD (hyperlipidemia)   Hypothyroid   Anxiety   Normocytic anemia   RA (rheumatoid arthritis) (HCC)   TIA (transient ischemic attack)   Malnutrition of moderate degree  Fracture of femoral neck, right , closed: Patient presented status post fall.  X-ray showed right femoral neck fracture. Orthopedic consulted.  Patient underwent ORIF.  Postoperative day 3 Continue adequate pain control with morphine, Percocet ,Tylenol as needed Continue Zofran as needed for nausea. Continue Robaxin for muscle spasm. PT and OT when able to after surgery. Ortho recommended Lovenox daily for DVT prophylaxis, weightbearing as tolerated with posterior hip precautions. PT recommended SNF , awaiting insurance authorization.  History of TIA: Continue Lipitor 20 mg daily.  Rheumatoid arthritis: Continue home methotrexate 12.5 mg weekly.  Normocytic anemia: Hb dropped , could be due to ORIF Hb 10.6 >9.5 >9.1 >8.8 >8.0 Transfuse if hemoglobin drops below 7.  There is no any obvious visible bleeding.  Monitor H&H  Generalized anxiety: Continue  Klonopin 0.5 mg tid  Hypothyroidism: Continue Synthroid 100 mg daily  Hyperlipidemia: Continue Lipitor 20 mg daily  Essential hypertension: Continue Lisinopril 5 mg daily   DVT prophylaxis: SCDs Code Status: Full code. Family Communication: No family at bed side. Disposition Plan:   Status is: Inpatient Remains inpatient appropriate because: Admitted s/p fall with right femoral fracture. Patient underwent ORIF, postoperative day 3 , tolerated well.  PT recommended SNF awaiting insurance Auth.    Consultants:  Orthopedics  Procedures: ORIF Antimicrobials: Anti-infectives (From admission, onward)    Start     Dose/Rate Route Frequency Ordered Stop   06/13/21 2000  ceFAZolin (ANCEF) IVPB 2g/100 mL premix        2 g 200 mL/hr over 30 Minutes Intravenous Every 6 hours 06/13/21 1738 06/14/21 0536   06/13/21 1433  vancomycin (VANCOCIN) powder  Status:  Discontinued          As needed 06/13/21 1433 06/13/21 1601   06/13/21 1400  ceFAZolin (ANCEF) IVPB 2g/100 mL premix        2 g 200 mL/hr over 30 Minutes Intravenous To Surgery 06/13/21 0741 06/13/21 1409   06/13/21 1226  ceFAZolin (ANCEF) 2-4 GM/100ML-% IVPB       Note to Pharmacy: Register, Santiago Glad A: cabinet override      06/13/21 1226 06/13/21 1434        Subjective: Patient was seen and examined at bedside.  Overnight events noted.   Patient reports feeling much improved. He is status post ORIF, postoperative day 3. Patient was sitting comfortably on the chair, having breakfast and watching television.   He reports he is feeling better and awaiting insurance decision  to be discharged.  Objective: Vitals:   06/15/21 1540 06/15/21 2010 06/16/21 0418 06/16/21 0835  BP: 133/65 (!) 134/54 114/64 (!) 116/58  Pulse: 85 86 72 72  Resp: 20 15 15    Temp: 98.5 F (36.9 C) 99.7 F (37.6 C) 99.2 F (37.3 C) 98.3 F (36.8 C)  TempSrc:      SpO2: 97% (!) 78% 99% 98%  Weight:      Height:        Intake/Output Summary  (Last 24 hours) at 06/16/2021 1126 Last data filed at 06/16/2021 0602 Gross per 24 hour  Intake 1677.19 ml  Output 800 ml  Net 877.19 ml   Filed Weights   06/12/21 1312 06/12/21 1909 06/13/21 1226  Weight: 66.3 kg 66.3 kg 66.3 kg    Examination:  General exam: Appears comfortable, pleasant, not in any acute distress. Respiratory system: CTA bilaterally, no wheezing, no crackles, normal respiratory effort. Cardiovascular system: S1-S2 heard, regular rate and rhythm, no murmur. Gastrointestinal system: Abdomen is soft, nontender, nondistended, normal bowel sounds. Central nervous system: Alert and oriented x 3. No focal neurological deficits. Extremities: Mild tenderness noted in the right hip area, dressing noted. Skin: No rashes, lesions or ulcers Psychiatry: Judgement and insight appear normal. Mood & affect appropriate.     Data Reviewed: I have personally reviewed following labs and imaging studies  CBC: Recent Labs  Lab 06/12/21 1323 06/13/21 0445 06/14/21 0518 06/15/21 0504 06/16/21 0511  WBC 7.7 10.4 11.6* 11.2* 9.4  NEUTROABS 5.7  --   --   --   --   HGB 10.6* 9.5* 9.1* 8.8* 8.0*  HCT 33.3* 30.2* 29.2* 27.9* 25.5*  MCV 95.7 96.5 96.7 96.5 97.0  PLT 285 244 189 187 [id number]   Basic Metabolic Panel: Recent Labs  Lab 06/12/21 1323 06/14/21 0518  NA 134* 135  K 4.7 3.9  CL 100 106  CO2 29 26  GLUCOSE 112* 124*  BUN 17 17  CREATININE 0.98 1.03  CALCIUM 8.7* 7.9*  MG  --  1.8  PHOS  --  2.6   GFR: Estimated Creatinine Clearance: 49.9 mL/min (by C-G formula based on SCr of 1.03 mg/dL). Liver Function Tests: No results for input(s): "AST", "ALT", "ALKPHOS", "BILITOT", "PROT", "ALBUMIN" in the last 168 hours. No results for input(s): "LIPASE", "AMYLASE" in the last 168 hours. No results for input(s): "AMMONIA" in the last 168 hours. Coagulation Profile: Recent Labs  Lab 06/12/21 2024  INR 1.2   Cardiac Enzymes: No results for input(s): "CKTOTAL", "CKMB",  "CKMBINDEX", "TROPONINI" in the last 168 hours. BNP (last 3 results) No results for input(s): "PROBNP" in the last 8760 hours. HbA1C: No results for input(s): "HGBA1C" in the last 72 hours. CBG: No results for input(s): "GLUCAP" in the last 168 hours. Lipid Profile: No results for input(s): "CHOL", "HDL", "LDLCALC", "TRIG", "CHOLHDL", "LDLDIRECT" in the last 72 hours. Thyroid Function Tests: No results for input(s): "TSH", "T4TOTAL", "FREET4", "T3FREE", "THYROIDAB" in the last 72 hours. Anemia Panel: No results for input(s): "VITAMINB12", "FOLATE", "FERRITIN", "TIBC", "IRON", "RETICCTPCT" in the last 72 hours. Sepsis Labs: No results for input(s): "PROCALCITON", "LATICACIDVEN" in the last 168 hours.  Recent Results (from the past 240 hour(s))  Surgical pcr screen     Status: None   Collection Time: 06/12/21  8:46 PM   Specimen: Nasal Mucosa; Nasal Swab  Result Value Ref Range Status   MRSA, PCR NEGATIVE NEGATIVE Final   Staphylococcus aureus NEGATIVE NEGATIVE Final    Comment: (NOTE) The  Xpert SA Assay (FDA approved for NASAL specimens in patients 46 years of age and older), is one component of a comprehensive surveillance program. It is not intended to diagnose infection nor to guide or monitor treatment. Performed at [facility], [address] [place], [place] [address]     Radiology Studies: No results found.  Scheduled Meds:  atorvastatin  20 mg Oral Daily   azelastine  1 spray Each Nare BID   Chlorhexidine Gluconate Cloth  6 each Topical Daily   clonazePAM  0.5 mg Oral TID   clotrimazole-betamethasone  1 application  Topical Daily   docusate sodium  100 mg Oral BID   enoxaparin (LOVENOX) injection  40 mg Subcutaneous Q24H   feeding supplement  237 mL Oral TID BM   folic acid  2 mg Oral Daily   gabapentin  100 mg Oral BID   ketotifen  1 drop Both Eyes BID   levothyroxine  100 mcg Oral Daily   lisinopril  5 mg Oral BID   methotrexate  12.5 mg Oral  Weekly   multivitamin with minerals  1 tablet Oral Daily   pantoprazole  40 mg Oral q morning   Continuous Infusions:  sodium chloride 75 mL/hr at 06/16/21 0524   tranexamic acid       LOS: 4 days    Time spent: 30 mins   Anoop Hemmer, MD [facility]   If 7PM-7AM, please contact night-coverage

## 2021-06-16 NOTE — Progress Notes (Signed)
Physical Therapy Treatment Patient Details Name: Mitchell Ayers MRN: [id number] DOB: Nov 02, 1940 Today's Date: 06/16/2021   History of Present Illness 81 y.o. male  s/p fall resulting in right femoral neck fracture, ultimately having a posterior approach total hip replcement 06/13/21. Medical history significant for hypertension, hyperlipidemia, TIA, GERD, hypothyroidism, anxiety, BPH, anemia, rheumatoid arthritis, essential tremor.    PT Comments    Pt pleasant and motivated t/o the session.  He did need consistent cuing with mobility, regarding precautions, AD use, etc but ultimately he was able to participate nicely with LE exercises and manged to circumambulate the nurses' station with consistent, albeit, asymmetrical limping labored effort.     Recommendations for follow up therapy are one component of a multi-disciplinary discharge planning process, led by the attending physician.  Recommendations may be updated based on patient status, additional functional criteria and insurance authorization.  Follow Up Recommendations  Skilled nursing-short term rehab (<3 hours/day)     Assistance Recommended at Discharge Frequent or constant Supervision/Assistance  Patient can return home with the following A little help with walking and/or transfers;A lot of help with bathing/dressing/bathroom;Assistance with cooking/housework;Assist for transportation;Help with stairs or ramp for entrance   Equipment Recommendations  Rolling walker (2 wheels);BSC/3in1    Recommendations for Other Services       Precautions / Restrictions Precautions Precautions: Posterior Hip Precaution Booklet Issued: Yes (comment) Restrictions RLE Weight Bearing: Weight bearing as tolerated     Mobility  Bed Mobility               General bed mobility comments: in recliner on arrival    Transfers Overall transfer level: Needs assistance Equipment used: Rolling walker (2 wheels) Transfers: Sit to/from  Stand Sit to Stand: Min assist           General transfer comment: Cues for hand placement and set up, still struggled to get to standing and needed light assist to keep weight shifting forward and up    Ambulation/Gait Ambulation/Gait assistance: Min guard Gait Distance (Feet): 180 Feet Assistive device: Rolling walker (2 wheels)         General Gait Details: Pt very motivated to push himself during ambulation and ultimately walked nearly 200 ft.  He had signficant lean on walker and limp on R LE.  Made only minimal adjustments despite much cuing but did not have any LOBs or overt safety concerns. Slow but consistent t/o the effort.   Stairs             Wheelchair Mobility    Modified Rankin (Stroke Patients Only)       Balance Overall balance assessment: Needs assistance   Sitting balance-Leahy Scale: Good       Standing balance-Leahy Scale: Fair Standing balance comment: Reliant on UE support of RW; weight shifts onto LLE                            Cognition Arousal/Alertness: Awake/alert Behavior During Therapy: WFL for tasks assessed/performed Overall Cognitive Status: Within Functional Limits for tasks assessed                                          Exercises Total Joint Exercises Quad Sets: Strengthening, 10 reps Heel Slides: Strengthening, 10 reps (with resitsed leg ext) Hip ABduction/ADduction: Strengthening, 10 reps Long Arc Quad: Strengthening, 10 reps  General Comments        Pertinent Vitals/Pain Pain Assessment Pain Assessment: 0-10 Pain Score: 3  Pain Location: right hip    Home Living                          Prior Function            PT Goals (current goals can now be found in the care plan section) Progress towards PT goals: Progressing toward goals    Frequency    7X/week      PT Plan Frequency needs to be updated    Co-evaluation              AM-PAC PT "6  Clicks" Mobility   Outcome Measure  Help needed turning from your back to your side while in a flat bed without using bedrails?: A Little Help needed moving from lying on your back to sitting on the side of a flat bed without using bedrails?: A Little Help needed moving to and from a bed to a chair (including a wheelchair)?: A Little Help needed standing up from a chair using your arms (e.g., wheelchair or bedside chair)?: A Little Help needed to walk in hospital room?: A Little Help needed climbing 3-5 steps with a railing? : Total 6 Click Score: 16    End of Session Equipment Utilized During Treatment: Gait belt Activity Tolerance: Patient tolerated treatment well Patient left: with chair alarm set;with call bell/phone within reach Nurse Communication: Mobility status;Precautions PT Visit Diagnosis: Unsteadiness on feet (R26.81);Other abnormalities of gait and mobility (R26.89);History of falling (Z91.81);Muscle weakness (generalized) (M62.81);Difficulty in walking, not elsewhere classified (R26.2)     Time: 0263-7858 PT Time Calculation (min) (ACUTE ONLY): 33 min  Charges:  $Gait Training: 8-22 mins $Therapeutic Exercise: 8-22 mins                     Malachi Pro, DPT 06/16/2021, 1:58 PM

## 2021-06-16 NOTE — TOC Progression Note (Signed)
Transition of Care [facility]) - Progression Note    Patient Details  Name: Mitchell Ayers MRN: [id number] Date of Birth: 1940-04-15  Transition of Care [facility]) CM/SW Contact  Joseph Art, [place] Phone Number: [phone] 06/16/2021, 10:59 AM  Clinical Narrative:     Pending insurance auth, for placement at [facility]. [facility] is requesting auth.  Expected Discharge Plan: Skilled Nursing Facility Barriers to Discharge: Continued Medical Work up  Expected Discharge Plan and Services Expected Discharge Plan: Skilled Nursing Facility In-house Referral: Clinical Social Work   Post Acute Care Choice: Skilled Nursing Facility Living arrangements for the past 2 months: Single Family Home                                       Social Determinants of Health (SDOH) Interventions    Readmission Risk Interventions     No data to display

## 2021-06-16 NOTE — Progress Notes (Signed)
Subjective: 3 Days Post-Op Procedure(s) (LRB): ARTHROPLASTY BIPOLAR HIP (HEMIARTHROPLASTY) (Right)   The patient is alert awake and sitting up in a chair out of bed today.  His wife is present.  Pain is moderate.  Dressing is dry.  Hemoglobin has slowly drifted down to 8.0.  He will need skilled nursing placement.  Patient reports pain as mild.  Objective:   VITALS:   Vitals:   06/16/21 0418 06/16/21 0835  BP: 114/64 (!) 116/58  Pulse: 72 72  Resp: 15   Temp: 99.2 F (37.3 C) 98.3 F (36.8 C)  SpO2: 99% 98%    Neurologically intact Incision: dressing C/D/I  LABS Recent Labs    06/14/21 0518 06/15/21 0504 06/16/21 0511  HGB 9.1* 8.8* 8.0*  HCT 29.2* 27.9* 25.5*  WBC 11.6* 11.2* 9.4  PLT 189 187 225    Recent Labs    06/14/21 0518  NA 135  K 3.9  BUN 17  CREATININE 1.03  GLUCOSE 124*    No results for input(s): "LABPT", "INR" in the last 72 hours.   Assessment/Plan: 3 Days Post-Op Procedure(s) (LRB): ARTHROPLASTY BIPOLAR HIP (HEMIARTHROPLASTY) (Right)   Advance diet Up with therapy D/C IV fluids Discharge to SNF when medically stable.  The weight-bear as tolerated with a walker on the right leg  Return to see Dr. Renee Harder at [facility] in 10 days to 2 weeks.

## 2021-06-17 DIAGNOSIS — S72001A Fracture of unspecified part of neck of right femur, initial encounter for closed fracture: Secondary | ICD-10-CM | POA: Diagnosis not present

## 2021-06-17 LAB — HEMOGLOBIN AND HEMATOCRIT, BLOOD
HCT: 26.1 % — ABNORMAL LOW (ref 39.0–52.0)
Hemoglobin: 8.2 g/dL — ABNORMAL LOW (ref 13.0–17.0)

## 2021-06-17 NOTE — Progress Notes (Signed)
PROGRESS NOTE    Mitchell PoissonCecil Ayers  [id number] DOB: 31-Jan-1940 DOA: 06/12/2021 PCP: Marlan PalauGard, Allison   Brief Narrative: This 81 years old male with PMH significant for hypertension, hyperlipidemia, TIA, GERD, hypothyroidism, anxiety, BPH, anemia, rheumatoid arthritis, essential tremor presented s/p fall with right hip pain.  Patient report he was stepping down off a step stool this afternoon missing the last exam and fell on his right side and developed right hip pain.  Patient denies any loss of consciousness.  Work-up in the ED shows right femoral neck fracture. Ortho is consulted.  Patient underwent ORIF , tolerated well, post operative day # 3.  PT recommended SNF, awaiting insurance authorization.  Assessment & Plan:   Principal Problem:   Fracture of femoral neck, right, closed (HCC) Active Problems:   Fall at home, initial encounter   HTN (hypertension)   HLD (hyperlipidemia)   Hypothyroid   Anxiety   Normocytic anemia   RA (rheumatoid arthritis) (HCC)   TIA (transient ischemic attack)   Malnutrition of moderate degree  Fracture of femoral neck, right , closed: Patient presented status post fall.  X-ray showed right femoral neck fracture. Orthopedic consulted.  Patient underwent ORIF.  Postoperative day 4 Continue adequate pain control with morphine, Percocet ,Tylenol as needed Continue Zofran as needed for nausea. Continue Robaxin for muscle spasm. Ortho recommended Lovenox daily for DVT prophylaxis, weightbearing as tolerated with posterior hip precautions. PT/OT recommended SNF , awaiting insurance authorization.  History of TIA: Continue Lipitor 20 mg daily.  Rheumatoid arthritis: Continue home methotrexate 12.5 mg weekly.  Normocytic anemia: Hb dropped , could be due to ORIF Hb 10.6 >9.5 >9.1 >8.8 >8.0 >9.3 >8.2 Transfuse if hemoglobin drops below 7.  There is no any obvious visible bleeding.  Monitor H&H  Generalized anxiety: Continue Klonopin 0.5 mg  tid  Hypothyroidism: Continue Synthroid 100 mg daily  Hyperlipidemia: Continue Lipitor 20 mg daily  Essential hypertension: Continue Lisinopril 5 mg daily   DVT prophylaxis: SCDs Code Status: Full code. Family Communication: No family at bed side. Disposition Plan:   Status is: Inpatient Remains inpatient appropriate because: Admitted s/p fall with right femoral fracture. Patient underwent ORIF, postoperative day 4 , tolerated well.  PT recommended SNF awaiting insurance Auth.    Consultants:  Orthopedics  Procedures: ORIF Antimicrobials: Anti-infectives (From admission, onward)    Start     Dose/Rate Route Frequency Ordered Stop   06/13/21 2000  ceFAZolin (ANCEF) IVPB 2g/100 mL premix        2 g 200 mL/hr over 30 Minutes Intravenous Every 6 hours 06/13/21 1738 06/14/21 0536   06/13/21 1433  vancomycin (VANCOCIN) powder  Status:  Discontinued          As needed 06/13/21 1433 06/13/21 1601   06/13/21 1400  ceFAZolin (ANCEF) IVPB 2g/100 mL premix        2 g 200 mL/hr over 30 Minutes Intravenous To Surgery 06/13/21 0741 06/13/21 1409   06/13/21 1226  ceFAZolin (ANCEF) 2-4 GM/100ML-% IVPB       Note to Pharmacy: Register, Clydie BraunKaren A: cabinet override      06/13/21 1226 06/13/21 1434        Subjective: Patient was seen and examined at bedside.  Overnight events noted.   Patient reports feeling much improved. He is s/p ORIF post operative day 4. Patient was sitting comfortably on the chair,  watching television and having breakfast. Patient reports he is feeling better and awaiting authorization decision to be discharged.   Objective: Vitals:  06/16/21 1950 06/17/21 0338 06/17/21 0907 06/17/21 0941  BP: 131/66 124/67 (!) 118/59   Pulse: 70 69 91   Resp: 18 17 (!) 22 18  Temp: 97.8 F (36.6 C) 97.9 F (36.6 C) 97.6 F (36.4 C)   TempSrc:   Oral   SpO2: 99% 97% 100%   Weight:      Height:        Intake/Output Summary (Last 24 hours) at 06/17/2021 1023 Last  data filed at 06/17/2021 0500 Gross per 24 hour  Intake --  Output 950 ml  Net -950 ml   Filed Weights   06/12/21 1312 06/12/21 1909 06/13/21 1226  Weight: 66.3 kg 66.3 kg 66.3 kg    Examination:  General exam: Appears comfortable, pleasant, not in any acute distress. Respiratory system: CTA bilaterally, no wheezing, no crackles, normal respiratory effort. Cardiovascular system: S1-S2 heard, regular rate and rhythm no murmur. Gastrointestinal system: Abdomen is soft, non distended, non tender, normal bowel sounds Central nervous system: Alert and oriented x 3. No focal neurological deficits. Extremities: Mild tenderness noted in the right hip area, dressing noted. Rt. Hip joint is more flexible. Skin: No rashes, lesions or ulcers Psychiatry: Judgement and insight appear normal. Mood & affect appropriate.     Data Reviewed: I have personally reviewed following labs and imaging studies  CBC: Recent Labs  Lab 06/12/21 1323 06/13/21 0445 06/14/21 0518 06/15/21 0504 06/16/21 0511 06/16/21 1639 06/17/21 0456  WBC 7.7 10.4 11.6* 11.2* 9.4  --   --   NEUTROABS 5.7  --   --   --   --   --   --   HGB 10.6* 9.5* 9.1* 8.8* 8.0* 9.3* 8.2*  HCT 33.3* 30.2* 29.2* 27.9* 25.5* 29.0* 26.1*  MCV 95.7 96.5 96.7 96.5 97.0  --   --   PLT 285 244 189 187 225  --   --    Basic Metabolic Panel: Recent Labs  Lab 06/12/21 1323 06/14/21 0518  NA 134* 135  K 4.7 3.9  CL 100 106  CO2 29 26  GLUCOSE 112* 124*  BUN 17 17  CREATININE 0.98 1.03  CALCIUM 8.7* 7.9*  MG  --  1.8  PHOS  --  2.6   GFR: Estimated Creatinine Clearance: 49.9 mL/min (by C-G formula based on SCr of 1.03 mg/dL). Liver Function Tests: No results for input(s): "AST", "ALT", "ALKPHOS", "BILITOT", "PROT", "ALBUMIN" in the last 168 hours. No results for input(s): "LIPASE", "AMYLASE" in the last 168 hours. No results for input(s): "AMMONIA" in the last 168 hours. Coagulation Profile: Recent Labs  Lab 06/12/21 2024   INR 1.2   Cardiac Enzymes: No results for input(s): "CKTOTAL", "CKMB", "CKMBINDEX", "TROPONINI" in the last 168 hours. BNP (last 3 results) No results for input(s): "PROBNP" in the last 8760 hours. HbA1C: No results for input(s): "HGBA1C" in the last 72 hours. CBG: No results for input(s): "GLUCAP" in the last 168 hours. Lipid Profile: No results for input(s): "CHOL", "HDL", "LDLCALC", "TRIG", "CHOLHDL", "LDLDIRECT" in the last 72 hours. Thyroid Function Tests: No results for input(s): "TSH", "T4TOTAL", "FREET4", "T3FREE", "THYROIDAB" in the last 72 hours. Anemia Panel: No results for input(s): "VITAMINB12", "FOLATE", "FERRITIN", "TIBC", "IRON", "RETICCTPCT" in the last 72 hours. Sepsis Labs: No results for input(s): "PROCALCITON", "LATICACIDVEN" in the last 168 hours.  Recent Results (from the past 240 hour(s))  Surgical pcr screen     Status: None   Collection Time: 06/12/21  8:46 PM   Specimen: Nasal Mucosa; Nasal  Swab  Result Value Ref Range Status   MRSA, PCR NEGATIVE NEGATIVE Final   Staphylococcus aureus NEGATIVE NEGATIVE Final    Comment: (NOTE) The Xpert SA Assay (FDA approved for NASAL specimens in patients 34 years of age and older), is one component of a comprehensive surveillance program. It is not intended to diagnose infection nor to guide or monitor treatment. Performed at [facility], [address] [place], [place] [address]     Radiology Studies: No results found.  Scheduled Meds:  atorvastatin  20 mg Oral Daily   azelastine  1 spray Each Nare BID   Chlorhexidine Gluconate Cloth  6 each Topical Daily   clonazePAM  0.5 mg Oral TID   clotrimazole-betamethasone  1 application  Topical Daily   docusate sodium  100 mg Oral BID   enoxaparin (LOVENOX) injection  40 mg Subcutaneous Q24H   feeding supplement  237 mL Oral TID BM   folic acid  2 mg Oral Daily   gabapentin  100 mg Oral BID   ketotifen  1 drop Both Eyes BID   levothyroxine  100  mcg Oral Daily   lisinopril  5 mg Oral BID   methotrexate  12.5 mg Oral Weekly   multivitamin with minerals  1 tablet Oral Daily   pantoprazole  40 mg Oral q morning   Continuous Infusions:  tranexamic acid       LOS: 5 days    Time spent: 30 mins   Jizel Cheeks, MD [facility]   If 7PM-7AM, please contact night-coverage

## 2021-06-17 NOTE — Plan of Care (Signed)
  Problem: Activity: Goal: Ability to ambulate and perform ADLs will improve Outcome: Progressing   Problem: Pain Management: Goal: Pain level will decrease Outcome: Progressing   Problem: Activity: Goal: Risk for activity intolerance will decrease Outcome: Progressing   Problem: Elimination: Goal: Will not experience complications related to bowel motility Outcome: Progressing   Problem: Pain Managment: Goal: General experience of comfort will improve Outcome: Progressing   Problem: Safety: Goal: Ability to remain free from injury will improve Outcome: Progressing   Problem: Skin Integrity: Goal: Risk for impaired skin integrity will decrease Outcome: Progressing

## 2021-06-17 NOTE — Progress Notes (Signed)
Physical Therapy Treatment Patient Details Name: Mitchell Ayers MRN: [id number] DOB: 03-10-1940 Today's Date: 06/17/2021   History of Present Illness 81 y.o. male  s/p fall resulting in right femoral neck fracture, ultimately having a posterior approach total hip replcement 06/13/21. Medical history significant for hypertension, hyperlipidemia, TIA, GERD, hypothyroidism, anxiety, BPH, anemia, rheumatoid arthritis, essential tremor.    PT Comments    Pt again with good effort and motivation.  He has slow, limping, UE reliant gait but again able to circumambulate the nurses station.  Pt did not recall hip precautions on arrival but educated on these and he did recall 3/3 at end of session, 2/3 a few hours later to insure recall.     Recommendations for follow up therapy are one component of a multi-disciplinary discharge planning process, led by the attending physician.  Recommendations may be updated based on patient status, additional functional criteria and insurance authorization.  Follow Up Recommendations  Skilled nursing-short term rehab (<3 hours/day)     Assistance Recommended at Discharge Frequent or constant Supervision/Assistance  Patient can return home with the following A little help with walking and/or transfers;A lot of help with bathing/dressing/bathroom;Assistance with cooking/housework;Assist for transportation;Help with stairs or ramp for entrance   Equipment Recommendations  Rolling walker (2 wheels);BSC/3in1    Recommendations for Other Services       Precautions / Restrictions Precautions Precautions: Posterior Hip Precaution Booklet Issued: Yes (comment) Precaution Comments: Pt did not recall hip precautions at start of session today, did recall 3/3 post session and 2/3 when PT returned for "popquiz" ~3 hours later Restrictions Weight Bearing Restrictions: No RLE Weight Bearing: Weight bearing as tolerated     Mobility  Bed Mobility                General bed mobility comments: in recliner on arrival    Transfers Overall transfer level: Needs assistance Equipment used: Rolling walker (2 wheels) Transfers: Sit to/from Stand Sit to Stand: Min guard           General transfer comment: Cues for hand placement and set up, more confident with rise to standing today, did not need direct assist    Ambulation/Gait Ambulation/Gait assistance: Min guard Gait Distance (Feet): 200 Feet Assistive device: Rolling walker (2 wheels)   Gait velocity: decreased     General Gait Details: Again with signficant lean on walker and limp on R LE, but highly motivated to push himself.  Slow, asymmetical but consistent  effort.   Stairs             Wheelchair Mobility    Modified Rankin (Stroke Patients Only)       Balance Overall balance assessment: Needs assistance   Sitting balance-Leahy Scale: Good     Standing balance support: Bilateral upper extremity supported Standing balance-Leahy Scale: Fair Standing balance comment: Reliant on UE support of RW; decreased R WBing tolerance                            Cognition Arousal/Alertness: Awake/alert Behavior During Therapy: WFL for tasks assessed/performed Overall Cognitive Status: Within Functional Limits for tasks assessed                                          Exercises Total Joint Exercises Ankle Circles/Pumps: AROM, 10 reps Quad Sets: Strengthening, 10 reps Heel Slides:  Strengthening, 10 reps Hip ABduction/ADduction: Strengthening, 10 reps Long Arc Quad: Strengthening, 10 reps Marching in Standing: Seated, Strengthening, 10 reps    General Comments        Pertinent Vitals/Pain Pain Assessment Pain Assessment: 0-10 Pain Score: 2     Home Living                          Prior Function            PT Goals (current goals can now be found in the care plan section) Progress towards PT goals: Progressing toward  goals    Frequency    7X/week      PT Plan Current plan remains appropriate    Co-evaluation              AM-PAC PT "6 Clicks" Mobility   Outcome Measure  Help needed turning from your back to your side while in a flat bed without using bedrails?: A Little Help needed moving from lying on your back to sitting on the side of a flat bed without using bedrails?: A Little Help needed moving to and from a bed to a chair (including a wheelchair)?: A Little Help needed standing up from a chair using your arms (e.g., wheelchair or bedside chair)?: A Little Help needed to walk in hospital room?: A Little Help needed climbing 3-5 steps with a railing? : A Lot 6 Click Score: 17    End of Session Equipment Utilized During Treatment: Gait belt Activity Tolerance: Patient tolerated treatment well Patient left: with chair alarm set;with call bell/phone within reach Nurse Communication: Mobility status PT Visit Diagnosis: Unsteadiness on feet (R26.81);Other abnormalities of gait and mobility (R26.89);History of falling (Z91.81);Muscle weakness (generalized) (M62.81);Difficulty in walking, not elsewhere classified (R26.2)     Time: [id number] PT Time Calculation (min) (ACUTE ONLY): 43 min  Charges:  $Gait Training: 8-22 mins $Therapeutic Exercise: 8-22 mins $Therapeutic Activity: 8-22 mins                     Kreg Shropshire, DPT 06/17/2021, 4:50 PM

## 2021-06-17 NOTE — Progress Notes (Signed)
Subjective: 4 Days Post-Op Procedure(s) (LRB): ARTHROPLASTY BIPOLAR HIP (HEMIARTHROPLASTY) (Right) The patient is awake and lying quietly in bed this morning.  Movement of the right hip is better.  Hemoglobin has dropped to 8.2.  Dressing is dry.  Patient reports pain as mild.  Objective:   VITALS:   Vitals:   06/16/21 1950 06/17/21 0338  BP: 131/66 124/67  Pulse: 70 69  Resp: 18 17  Temp: 97.8 F (36.6 C) 97.9 F (36.6 C)  SpO2: 99% 97%    Neurologically intact Incision: dressing C/D/I  LABS Recent Labs    06/15/21 0504 06/16/21 0511 06/16/21 1639 06/17/21 0456  HGB 8.8* 8.0* 9.3* 8.2*  HCT 27.9* 25.5* 29.0* 26.1*  WBC 11.2* 9.4  --   --   PLT 187 225  --   --     No results for input(s): "NA", "K", "BUN", "CREATININE", "GLUCOSE" in the last 72 hours.  No results for input(s): "LABPT", "INR" in the last 72 hours.   Assessment/Plan: 4 Days Post-Op Procedure(s) (LRB): ARTHROPLASTY BIPOLAR HIP (HEMIARTHROPLASTY) (Right)   Advance diet Up with therapy D/C IV fluids Discharge to SNF  Follow-up with Dr. Okey Dupre in 10 days to 2 weeks.  Aspirin for DVT prophylaxis

## 2021-06-18 LAB — BASIC METABOLIC PANEL
Anion gap: 5 (ref 5–15)
BUN: 23 mg/dL (ref 8–23)
CO2: 27 mmol/L (ref 22–32)
Calcium: 8.2 mg/dL — ABNORMAL LOW (ref 8.9–10.3)
Chloride: 105 mmol/L (ref 98–111)
Creatinine, Ser: 1.04 mg/dL (ref 0.61–1.24)
GFR, Estimated: >60 mL/min (ref >60)
Glucose, Bld: 106 mg/dL — ABNORMAL HIGH (ref 70–99)
Potassium: 4.1 mmol/L (ref 3.5–5.1)
Sodium: 137 mmol/L (ref 135–145)

## 2021-06-18 LAB — CBC
HCT: 26.4 % — ABNORMAL LOW (ref 39.0–52.0)
Hemoglobin: 8.4 g/dL — ABNORMAL LOW (ref 13.0–17.0)
MCH: 30.4 pg (ref 26.0–34.0)
MCHC: 31.8 g/dL (ref 30.0–36.0)
MCV: 95.7 fL (ref 80.0–100.0)
Platelets: 321 10*3/uL (ref 150–400)
RBC: 2.76 MIL/uL — ABNORMAL LOW (ref 4.22–5.81)
RDW: 14.6 % (ref 11.5–15.5)
WBC: 7.7 10*3/uL (ref 4.0–10.5)
nRBC: 0 % (ref 0.0–0.2)

## 2021-06-18 MED ORDER — ASPIRIN 81 MG PO TBEC: 81.0000 mg | DELAYED_RELEASE_TABLET | Freq: Two times a day (BID) | ORAL | 0 refills | Status: DC

## 2021-06-18 MED ORDER — METHOCARBAMOL 500 MG PO TABS
500.0000 mg | ORAL_TABLET | Freq: Three times a day (TID) | ORAL | 0 refills | Status: DC | PRN
Start: 2021-06-18 — End: 2022-07-02

## 2021-06-18 NOTE — Progress Notes (Signed)
Offered patient PRN medication for mild constipation since his last bowel movement was 06/14/21, patient refused and state that he is already taking stool softener. Educate on the importance of having a bowel movement.

## 2021-06-18 NOTE — Discharge Instructions (Signed)
Advised to follow-up with primary care physician in 1 week. Advised to follow-up with Dr. Sharlet Salina in 10 days to 2 weeks. Advised to take aspirin 81 mg twice daily for DVT prophylaxis. Advised weightbearing as tolerated with posterior hip precautions.

## 2021-06-18 NOTE — Plan of Care (Signed)
  Problem: Education: Goal: Verbalization of understanding the information provided (i.e., activity precautions, restrictions, etc) will improve 06/18/2021 1153 by Glenford Peers, RN Outcome: Progressing 06/18/2021 1153 by Glenford Peers, RN Outcome: Progressing   Problem: Activity: Goal: Ability to ambulate and perform ADLs will improve 06/18/2021 1153 by Glenford Peers, RN Outcome: Progressing 06/18/2021 1153 by Glenford Peers, RN Outcome: Progressing   Problem: Pain Management: Goal: Pain level will decrease Outcome: Progressing

## 2021-06-18 NOTE — Evaluation (Signed)
Occupational Therapy Evaluation Patient Details Name: Mitchell PoissonCecil Ayers MRN: [id number] DOB: Jul 22, 1940 Today's Date: 06/18/2021   History of Present Illness 81 y.o. male  s/p fall resulting in right femoral neck fracture, ultimately having a posterior approach total hip replcement 06/13/21. Medical history significant for hypertension, hyperlipidemia, TIA, GERD, hypothyroidism, anxiety, BPH, anemia, rheumatoid arthritis, essential tremor.   Clinical Impression   Mitchell Ayers was seen for OT evaluation this date. Prior to hospital admission, pt was Independnet for moblity and ADLs. Pt lives with spouse in home with 6 STE - [facility]. Pt presents to acute OT demonstrating impaired ADL performance and functional mobility 2/2 decreased activity tolerance and functional strength/ROM/balance deficits. Pt currently requires SBA + RW sit<>stand and ~20 ft mobility, verbal cues to ID falls risks (napkins on floor, narrow space). SBA + single UE support to place pillows in cabinet and groom standing sinkside. MAX A don/doff socks in sitting - good recall of pcns. Pt would benefit from skilled OT to address noted impairments and functional limitations (see below for any additional details). Upon hospital discharge, recommend STR to maximize pt safety and return to PLOF. If pt has assistance from family for bathing/dressing and near 24/7 SUPERVISION for safety pt may d/c home safely.    Recommendations for follow up therapy are one component of a multi-disciplinary discharge planning process, led by the attending physician.  Recommendations may be updated based on patient status, additional functional criteria and insurance authorization.   Follow Up Recommendations  Skilled nursing-short term rehab (<3 hours/day) (may d/c home with initial 24/7 asssit)    Assistance Recommended at Discharge Frequent or constant Supervision/Assistance  Patient can return home with the following A little help with  walking and/or transfers;A lot of help with bathing/dressing/bathroom;Assistance with cooking/housework;Help with stairs or ramp for entrance    Functional Status Assessment  Patient has had a recent decline in their functional status and demonstrates the ability to make significant improvements in function in a reasonable and predictable amount of time.  Equipment Recommendations  BSC/3in1    Recommendations for Other Services       Precautions / Restrictions Precautions Precautions: Posterior Hip;Fall Restrictions Weight Bearing Restrictions: Yes RLE Weight Bearing: Weight bearing as tolerated      Mobility Bed Mobility               General bed mobility comments: received and left sitting    Transfers Overall transfer level: Needs assistance Equipment used: Rolling walker (2 wheels) Transfers: Sit to/from Stand Sit to Stand: Min guard           General transfer comment: no cues for hand placement today      Balance Overall balance assessment: Needs assistance Sitting-balance support: No upper extremity supported, Feet supported Sitting balance-Leahy Scale: Good     Standing balance support: No upper extremity supported, During functional activity Standing balance-Leahy Scale: Fair                             ADL either performed or assessed with clinical judgement   ADL Overall ADL's : Needs assistance/impaired                                       General ADL Comments: SBA + RW for ADL t/f, placing pillows in cabinet, and grooming standing sinkside. MAX A  don/doff socks in sitting - good recall of pcns.      Pertinent Vitals/Pain Pain Assessment Pain Assessment: 0-10 Pain Score: 4  Pain Location: right hip Pain Descriptors / Indicators: Aching, Sore Pain Intervention(s): Limited activity within patient's tolerance, Repositioned     Hand Dominance     Extremity/Trunk Assessment Upper Extremity Assessment Upper  Extremity Assessment: Overall WFL for tasks assessed   Lower Extremity Assessment Lower Extremity Assessment: Generalized weakness       Communication Communication Communication: No difficulties   Cognition Arousal/Alertness: Awake/alert Behavior During Therapy: WFL for tasks assessed/performed Overall Cognitive Status: Within Functional Limits for tasks assessed                                 General Comments: fair insight into functional application of posterior hip pcns                Home Living Family/patient expects to be discharged to:: Private residence Living Arrangements: Spouse/significant other;Children Available Help at Discharge: Family Type of Home: House Home Access: Stairs to enter Secretary/administrator of Steps: 6 Entrance Stairs-Rails: Right;Left Home Layout: One level     Bathroom Shower/Tub: Chief Strategy Officer: Standard Bathroom Accessibility: No   Home Equipment: Agricultural consultant (2 wheels)   Additional Comments: reports wife is at [employer] currently      Prior Functioning/Environment Prior Level of Function : Independent/Modified Independent;Driving             Mobility Comments: States he was fully independent prior to fall; drives and does house/yard work. Only fall reported over the last 6 months is the fall leading to this admission.          OT Problem List: Decreased strength;Decreased range of motion;Decreased activity tolerance;Impaired balance (sitting and/or standing);Decreased safety awareness      OT Treatment/Interventions: Self-care/ADL training;Therapeutic exercise;Energy conservation;DME and/or AE instruction;Therapeutic activities;Balance training;Patient/family education    OT Goals(Current goals can be found in the care plan section) Acute Rehab OT Goals OT Goal Formulation: With patient Time For Goal Achievement: 07/02/21 Potential to Achieve Goals: Good ADL Goals Pt Will Perform  Grooming: Independently;standing Pt Will Perform Lower Body Dressing: with modified independence;with caregiver independent in assisting;with adaptive equipment;sit to/from stand Pt Will Transfer to Toilet: ambulating;regular height toilet;with modified independence Additional ADL Goal #1: Pt will verbalize plan to implement x3 falls prevention strategies  OT Frequency: Min 2X/week    Co-evaluation              AM-PAC OT "6 Clicks" Daily Activity     Outcome Measure Help from another person eating meals?: None Help from another person taking care of personal grooming?: A Little Help from another person toileting, which includes using toliet, bedpan, or urinal?: A Little Help from another person bathing (including washing, rinsing, drying)?: A Lot Help from another person to put on and taking off regular upper body clothing?: A Little Help from another person to put on and taking off regular lower body clothing?: A Lot 6 Click Score: 17   End of Session Equipment Utilized During Treatment: Gait belt;Rolling walker (2 wheels)  Activity Tolerance: Patient tolerated treatment well Patient left: in chair;with call bell/phone within reach;with chair alarm set  OT Visit Diagnosis: Other abnormalities of gait and mobility (R26.89);Muscle weakness (generalized) (M62.81)                Time: 0017-4944 OT Time Calculation (  min): 25 min Charges:  OT General Charges $OT Visit: 1 Visit OT Evaluation $OT Eval Moderate Complexity: 1 Mod OT Treatments $Self Care/Home Management : 8-22 mins  Kathie Dike, M.S. OTR/L  06/18/21, 9:43 AM  ascom [phone]

## 2021-06-18 NOTE — Anesthesia Postprocedure Evaluation (Signed)
Anesthesia Post Note  Patient: Mitchell Ayers  Procedure(s) Performed: ARTHROPLASTY BIPOLAR HIP (HEMIARTHROPLASTY) (Right: Hip)  Patient location during evaluation: Nursing Unit Anesthesia Type: Spinal Level of consciousness: oriented and awake and alert Pain management: pain level controlled Vital Signs Assessment: post-procedure vital signs reviewed and stable Respiratory status: spontaneous breathing Cardiovascular status: blood pressure returned to baseline and stable Postop Assessment: no headache, no backache, no apparent nausea or vomiting and patient able to bend at knees Anesthetic complications: no   No notable events documented.   Last Vitals:  Vitals:   06/17/21 1933 06/18/21 0350  BP: 109/61 128/71  Pulse: 77 74  Resp: 17 17  Temp: (!) 36.3 C 36.5 C  SpO2: 100% 99%    Last Pain:  Vitals:   06/17/21 2030  TempSrc:   PainSc: 0-No pain                 Precious Haws Janazia Schreier

## 2021-06-18 NOTE — Discharge Summary (Signed)
Physician Discharge Summary  Mitchell Ayers ZOX:[id number] DOB: Dec 12, 1940 DOA: 06/12/2021  PCP: Mitchell Ayers  Admit date: 06/12/2021  Discharge date: 06/18/2021  Admitted From: Home.  Disposition: [facility] skilled nursing facility.  Recommendations for Outpatient Follow-up:  Follow up with PCP in 1-2 weeks. Please obtain BMP/CBC in one week Advised to follow-up with Dr. Okey Ayers in 10 days to 2 weeks. Advised to take aspirin 81 mg twice daily for DVT prophylaxis. Advised weightbearing as tolerated with posterior hip precautions.  Home Health: None Equipment/Devices: None  Discharge Condition: Stable CODE STATUS:Full code Diet recommendation: Heart Healthy   Brief [facility] Course: This 81 years old male with PMH significant for hypertension, hyperlipidemia, TIA, GERD, hypothyroidism, anxiety, BPH, anemia, rheumatoid arthritis, essential tremor presented s/p fall with right hip pain.  Patient report he was stepping down off a step stool this afternoon,  missing the step and fell on his right side and developed right hip pain.  Patient denies any loss of consciousness.  Work-up in the ED shows right femoral neck fracture. Ortho is consulted.  Patient underwent ORIF , tolerated well, post operative day # 4.  PT recommended SNF.  Patient being discharged to a skilled nursing facility for rehabilitation.  Patient is cleared from orthopedics follow-up outpatient in 10 days to 2 weeks with Dr. Okey Ayers.  Discharge Diagnoses:  Principal Problem:   Fracture of femoral neck, right, closed (HCC) Active Problems:   Fall at home, initial encounter   HTN (hypertension)   HLD (hyperlipidemia)   Hypothyroid   Anxiety   Normocytic anemia   RA (rheumatoid arthritis) (HCC)   TIA (transient ischemic attack)   Malnutrition of moderate degree   Fracture of femoral neck, right , closed: Patient presented status post fall.  X-ray showed right femoral neck fracture. Orthopedic  consulted.  Patient underwent ORIF.  Postoperative day 5 Continue adequate pain control with morphine, Percocet ,Tylenol as needed Continue Zofran as needed for nausea. Continue Robaxin for muscle spasm. Ortho recommended Lovenox daily for DVT prophylaxis, weightbearing as tolerated with posterior hip precautions. PT/OT recommended SNF , authorization approved.   History of TIA: Continue Lipitor 20 mg daily.   Rheumatoid arthritis: Continue home methotrexate 12.5 mg weekly.   Normocytic anemia: Hb dropped , could be due to ORIF Hb 10.6 >9.5 >9.1 >8.8 >8.0 >9.3 >8.2 >8.4 There is no any obvious visible bleeding.  Monitor H&H   Generalized anxiety: Continue Klonopin 0.5 mg tid.   Hypothyroidism: Continue Synthroid 100 mg daily.   Hyperlipidemia: Continue Lipitor 20 mg daily   Essential hypertension: Continue Lisinopril 5 mg daily    Discharge Instructions  Discharge Instructions     Call MD for:  difficulty breathing, headache or visual disturbances   Complete by: As directed    Call MD for:  persistant dizziness or light-headedness   Complete by: As directed    Call MD for:  persistant nausea and vomiting   Complete by: As directed    Diet - low sodium heart healthy   Complete by: As directed    Diet Carb Modified   Complete by: As directed    Discharge instructions   Complete by: As directed    Advised to follow-up with primary care physician in 1 week. Advised to follow-up with Dr. Okey Ayers in 10 days to 2 weeks. Advised to take aspirin 81 mg twice daily for DVT prophylaxis. Advised weightbearing as tolerated with posterior hip precautions.   Discharge wound care:   Complete by: As directed  Follow-up orthopedics in 2 weeks   Increase activity slowly   Complete by: As directed       Allergies as of 06/18/2021       Reactions   Tetracyclines & Related Anaphylaxis   Tamsulosin    Other reaction(s): Headache Left sided headache        Medication  List     TAKE these medications    amoxicillin 500 MG tablet Commonly known as: AMOXIL Take 2,000 mg by mouth as directed. One hour prior to Dental Treatment   aspirin EC 81 MG tablet Take 1 tablet (81 mg total) by mouth 2 (two) times daily. Swallow whole.   atorvastatin 20 MG tablet Commonly known as: LIPITOR Take 20 mg by mouth daily.   Azelastine HCl 137 MCG/SPRAY Soln Place 1 spray into both nostrils 2 (two) times daily.   clorazepate 7.5 MG tablet Commonly known as: TRANXENE Take 7.5 mg by mouth 3 (three) times daily.   clotrimazole-betamethasone cream Commonly known as: LOTRISONE Apply 1 application. topically daily.   diphenoxylate-atropine 2.5-0.025 MG tablet Commonly known as: LOMOTIL Take 2 tablets by mouth 3 (three) times daily as needed for diarrhea or loose stools.   fluticasone 50 MCG/ACT nasal spray Commonly known as: FLONASE Place 2 sprays into both nostrils daily.   folic acid 1 MG tablet Commonly known as: FOLVITE Take 2 mg by mouth daily.   gabapentin 100 MG capsule Commonly known as: NEURONTIN Take 100 mg by mouth 2 (two) times daily.   HYDROcodone-acetaminophen 5-325 MG tablet Commonly known as: NORCO/VICODIN Take 1-2 tablets by mouth every 4 (four) hours as needed for moderate pain (pain score 4-6).   levothyroxine 100 MCG tablet Commonly known as: SYNTHROID Take 100 mcg by mouth daily.   lisinopril 5 MG tablet Commonly known as: ZESTRIL Take 5 mg by mouth 2 (two) times daily.   methocarbamol 500 MG tablet Commonly known as: ROBAXIN Take 1 tablet (500 mg total) by mouth every 8 (eight) hours as needed for muscle spasms.   methotrexate 2.5 MG tablet Commonly known as: RHEUMATREX Take 12.5 mg by mouth once a week.   pantoprazole 40 MG tablet Commonly known as: PROTONIX Take 40 mg by mouth every morning.               Discharge Care Instructions  (From admission, onward)           Start     Ordered   06/18/21 0000   Discharge wound care:       Comments: Follow-up orthopedics in 2 weeks   06/18/21 1021            Follow-up Information     Ross Marcus, MD. Schedule an appointment as soon as possible for a visit.   Specialty: Orthopedic Surgery Why: For wound re-check Please make an appointment prior to the patient's discharge from the hospital Contact information: [address] [place] [address] [phone]                Allergies  Allergen Reactions   Tetracyclines & Related Anaphylaxis   Tamsulosin     Other reaction(s): Headache Left sided headache    Consultations: Orthopedics   Procedures/Studies: Pelvis Portable  Result Date: 06/13/2021 CLINICAL DATA:  [id number].  Post op pelvis, right hip fx EXAM: PORTABLE PELVIS 1-2 VIEWS COMPARISON:  None Available. FINDINGS: Status post total right hip arthroplasty. Frontal view left hip grossly unremarkable. There is no evidence of pelvic fracture or diastasis. No  pelvic bone lesions are seen. Skin staples overlie the right hip. Subcutaneus soft tissue edema and emphysema of the right hip consistent with postsurgical changes. Vascular calcifications. IMPRESSION: Status post total right hip arthroplasty. Electronically Signed   By: Tish Frederickson M.D.   On: 06/13/2021 17:32   DG Chest 1 View  Result Date: 06/12/2021 CLINICAL DATA:  Fall. EXAM: CHEST  1 VIEW COMPARISON:  None Available. FINDINGS: The lungs are clear without focal pneumonia, edema, pneumothorax or pleural effusion. Interstitial markings are diffusely coarsened with chronic features. The cardiopericardial silhouette is within normal limits for size. Bones are diffusely demineralized. Telemetry leads overlie the chest. IMPRESSION: No active disease. Electronically Signed   By: Kennith Center M.D.   On: 06/12/2021 14:06   DG Hip Unilat W or Wo Pelvis 2-3 Views Right  Result Date: 06/12/2021 CLINICAL DATA:  Missed a step.  Fall. EXAM: DG HIP (WITH OR WITHOUT  PELVIS) 2-3V RIGHT COMPARISON:  None Available. FINDINGS: Three view study. Right femoral neck fracture noted with varus angulation. SI joints and pubis symphysis unremarkable. No evidence for superior or inferior pubic ramus fracture. No worrisome lytic or sclerotic osseous abnormality. IMPRESSION: Right femoral neck fracture with varus angulation. Electronically Signed   By: Kennith Center M.D.   On: 06/12/2021 14:06   DG Ankle Complete Right  Result Date: 06/12/2021 CLINICAL DATA:  Patient missed a step and injured right ankle. Pain. EXAM: RIGHT ANKLE - COMPLETE 3+ VIEW COMPARISON:  None Available. FINDINGS: There is no evidence of fracture, dislocation, or joint effusion. There is no evidence of arthropathy or other focal bone abnormality. Soft tissues are unremarkable. IMPRESSION: Negative. Electronically Signed   By: Kennith Center M.D.   On: 06/12/2021 14:05   CT Head Wo Contrast  Result Date: 06/12/2021 CLINICAL DATA:  Moderate to severe head trauma EXAM: CT HEAD WITHOUT CONTRAST CT CERVICAL SPINE WITHOUT CONTRAST TECHNIQUE: Multidetector CT imaging of the head and cervical spine was performed following the standard protocol without intravenous contrast. Multiplanar CT image reconstructions of the cervical spine were also generated. RADIATION DOSE REDUCTION: This exam was performed according to the departmental dose-optimization program which includes automated exposure control, adjustment of the mA and/or kV according to patient size and/or use of iterative reconstruction technique. COMPARISON:  None Available. FINDINGS: CT HEAD FINDINGS Brain: No acute intracranial hemorrhage. No focal mass lesion. No CT evidence of acute infarction. No midline shift or mass effect. No hydrocephalus. Basilar cisterns are patent. There are periventricular and subcortical white matter hypodensities. Generalized cortical atrophy. Vascular: No hyperdense vessel or unexpected calcification. Skull: Normal. Negative for  fracture or focal lesion. Sinuses/Orbits: Paranasal sinuses and mastoid air cells are clear. Orbits are clear. Other: None. CT CERVICAL SPINE FINDINGS Alignment: Normal alignment of the cervical vertebral bodies. Skull base and vertebrae: Normal craniocervical junction. No loss of vertebral body height or disc height. Normal facet articulation. No evidence of fracture. Soft tissues and spinal canal: No prevertebral soft tissue swelling. No perispinal or epidural hematoma. Disc levels: Endplates spurring and joint space narrowing at C5-C6 and C6-C7. No acute findings. Upper chest: Clear Other: Nuchal calcifications posterior to C6. IMPRESSION: 1. No acute intracranial findings. 2. No cervical spine fracture. 3. Multilevel disc osteophytic disease. Electronically Signed   By: Genevive Bi M.D.   On: 06/12/2021 13:53   CT Cervical Spine Wo Contrast  Result Date: 06/12/2021 CLINICAL DATA:  Moderate to severe head trauma EXAM: CT HEAD WITHOUT CONTRAST CT CERVICAL SPINE WITHOUT CONTRAST TECHNIQUE: Multidetector  CT imaging of the head and cervical spine was performed following the standard protocol without intravenous contrast. Multiplanar CT image reconstructions of the cervical spine were also generated. RADIATION DOSE REDUCTION: This exam was performed according to the departmental dose-optimization program which includes automated exposure control, adjustment of the mA and/or kV according to patient size and/or use of iterative reconstruction technique. COMPARISON:  None Available. FINDINGS: CT HEAD FINDINGS Brain: No acute intracranial hemorrhage. No focal mass lesion. No CT evidence of acute infarction. No midline shift or mass effect. No hydrocephalus. Basilar cisterns are patent. There are periventricular and subcortical white matter hypodensities. Generalized cortical atrophy. Vascular: No hyperdense vessel or unexpected calcification. Skull: Normal. Negative for fracture or focal lesion. Sinuses/Orbits:  Paranasal sinuses and mastoid air cells are clear. Orbits are clear. Other: None. CT CERVICAL SPINE FINDINGS Alignment: Normal alignment of the cervical vertebral bodies. Skull base and vertebrae: Normal craniocervical junction. No loss of vertebral body height or disc height. Normal facet articulation. No evidence of fracture. Soft tissues and spinal canal: No prevertebral soft tissue swelling. No perispinal or epidural hematoma. Disc levels: Endplates spurring and joint space narrowing at C5-C6 and C6-C7. No acute findings. Upper chest: Clear Other: Nuchal calcifications posterior to C6. IMPRESSION: 1. No acute intracranial findings. 2. No cervical spine fracture. 3. Multilevel disc osteophytic disease. Electronically Signed   By: Genevive Bi M.D.   On: 06/12/2021 13:53    S/p ORIF for right femoral neck fracture.  Subjective: Patient was seen and examined at bedside.  Overnight events noted.  Patient reports feeling much better.   S/p ORIF postoperative day 5.  Discharge Exam: Vitals:   06/18/21 0350 06/18/21 0742  BP: 128/71 139/72  Pulse: 74 75  Resp: 17 15  Temp: 97.7 F (36.5 C) 98.4 F (36.9 C)  SpO2: 99% 97%   Vitals:   06/17/21 1542 06/17/21 1933 06/18/21 0350 06/18/21 0742  BP: 119/75 109/61 128/71 139/72  Pulse: 68 77 74 75  Resp: 18 17 17 15   Temp: 97.6 F (36.4 C) (!) 97.4 F (36.3 C) 97.7 F (36.5 C) 98.4 F (36.9 C)  TempSrc:  Oral    SpO2: 100% 100% 99% 97%  Weight:      Height:        General: Pt is alert, awake, not in acute distress Cardiovascular: RRR, S1/S2 +, no rubs, no gallops Respiratory: CTA bilaterally, no wheezing, no rhonchi Abdominal: Soft, NT, ND, bowel sounds + Extremities: no edema, no cyanosis    The results of significant diagnostics from this hospitalization (including imaging, microbiology, ancillary and laboratory) are listed below for reference.     Microbiology: Recent Results (from the past 240 hour(s))  Surgical pcr  screen     Status: None   Collection Time: 06/12/21  8:46 PM   Specimen: Nasal Mucosa; Nasal Swab  Result Value Ref Range Status   MRSA, PCR NEGATIVE NEGATIVE Final   Staphylococcus aureus NEGATIVE NEGATIVE Final    Comment: (NOTE) The Xpert SA Assay (FDA approved for NASAL specimens in patients 72 years of age and older), is one component of a comprehensive surveillance program. It is not intended to diagnose infection nor to guide or monitor treatment. Performed at [facility], [address] [place], [place] [address]      Labs: BNP (last 3 results) No results for input(s): "BNP" in the last 8760 hours. Basic Metabolic Panel: Recent Labs  Lab 06/12/21 1323 06/14/21 0518 06/18/21 0431  NA 134* 135 137  K 4.7 3.9 4.1  CL 100 106 105  CO2 GLUCOSE 112* 124* 106*  BUN CREATININE 0.98 1.03 1.04  CALCIUM 8.7* 7.9* 8.2*  MG  --  1.8  --   PHOS  --  2.6  --    Liver Function Tests: No results for input(s): "AST", "ALT", "ALKPHOS", "BILITOT", "PROT", "ALBUMIN" in the last 168 hours. No results for input(s): "LIPASE", "AMYLASE" in the last 168 hours. No results for input(s): "AMMONIA" in the last 168 hours. CBC: Recent Labs  Lab 06/12/21 1323 06/13/21 0445 06/14/21 0518 06/15/21 0504 06/16/21 0511 06/16/21 1639 06/17/21 0456 06/18/21 0431  WBC 7.7 10.4 11.6* 11.2* 9.4  --   --  7.7  NEUTROABS 5.7  --   --   --   --   --   --   --   HGB 10.6* 9.5* 9.1* 8.8* 8.0* 9.3* 8.2* 8.4*  HCT 33.3* 30.2* 29.2* 27.9* 25.5* 29.0* 26.1* 26.4*  MCV 95.7 96.5 96.7 96.5 97.0  --   --  95.7  PLT 285 244 189 187 225  --   --  321   Cardiac Enzymes: No results for input(s): "CKTOTAL", "CKMB", "CKMBINDEX", "TROPONINI" in the last 168 hours. BNP: Invalid input(s): "POCBNP" CBG: No results for input(s): "GLUCAP" in the last 168 hours. D-Dimer No results for input(s): "DDIMER" in the last 72 hours. Hgb A1c No results for input(s): "HGBA1C" in the last  72 hours. Lipid Profile No results for input(s): "CHOL", "HDL", "LDLCALC", "TRIG", "CHOLHDL", "LDLDIRECT" in the last 72 hours. Thyroid function studies No results for input(s): "TSH", "T4TOTAL", "T3FREE", "THYROIDAB" in the last 72 hours.  Invalid input(s): "FREET3" Anemia work up No results for input(s): "VITAMINB12", "FOLATE", "FERRITIN", "TIBC", "IRON", "RETICCTPCT" in the last 72 hours. Urinalysis No results found for: "COLORURINE", "APPEARANCEUR", "LABSPEC", "PHURINE", "GLUCOSEU", "HGBUR", "BILIRUBINUR", "KETONESUR", "PROTEINUR", "UROBILINOGEN", "NITRITE", "LEUKOCYTESUR" Sepsis Labs Recent Labs  Lab 06/14/21 0518 06/15/21 0504 06/16/21 0511 06/18/21 0431  WBC 11.6* 11.2* 9.4 7.7   Microbiology Recent Results (from the past 240 hour(s))  Surgical pcr screen     Status: None   Collection Time: 06/12/21  8:46 PM   Specimen: Nasal Mucosa; Nasal Swab  Result Value Ref Range Status   MRSA, PCR NEGATIVE NEGATIVE Final   Staphylococcus aureus NEGATIVE NEGATIVE Final    Comment: (NOTE) The Xpert SA Assay (FDA approved for NASAL specimens in patients 5 years of age and older), is one component of a comprehensive surveillance program. It is not intended to diagnose infection nor to guide or monitor treatment. Performed at [facility], [address] [place], [place] [address]      Time coordinating discharge: Over 30 minutes  SIGNED:   Cipriano Bunker, MD  [facility] 06/18/2021, 1:02 PM Pager   If 7PM-7AM, please contact night-coverage

## 2021-06-18 NOTE — Care Management Important Message (Signed)
Important Message  Patient Details  Name: Jearld Romberg MRN: [id number] Date of Birth: [birth year]   Medicare Important Message Given:  Yes     [name] A Eloyse Causey 06/18/2021, 11:35 AM

## 2021-06-18 NOTE — TOC Progression Note (Addendum)
Transition of Care [facility]) - Progression Note    Patient Details  Name: Mitchell Ayers MRN: [id number] Date of Birth: 1940/01/25  [facility] CM/SW Contact  Maree Krabbe, LCSW Phone Number: 06/18/2021, 9:33 AM  Clinical Narrative:   Berkley Harvey obtained for [facility]. CSW spoke with pt's daughter and she states they is no way way pt can dc home, pt will have to go to SNF. Pt will not have 24/7 care at home.    Expected Discharge Plan: Skilled Nursing Facility Barriers to Discharge: Continued Medical Work up  Expected Discharge Plan and Services Expected Discharge Plan: Skilled Nursing Facility In-house Referral: Clinical Social Work   Post Acute Care Choice: Skilled Nursing Facility Living arrangements for the past 2 months: Single Family Home                                       Social Determinants of Health (SDOH) Interventions    Readmission Risk Interventions     No data to display

## 2021-06-18 NOTE — TOC Transition Note (Signed)
Transition of Care [facility]) - CM/SW Discharge Note   Patient Details  Name: Mitchell Ayers MRN: [id number] Date of Birth: 07/21/1940  [facility]) CM/SW Contact:  Eileen Stanford, LCSW Phone Number: 06/18/2021, 1:14 PM   Clinical Narrative:   Clinical Social Worker facilitated patient discharge including contacting patient family and facility to confirm patient discharge plans.  Clinical information faxed to facility and family agreeable with plan.  CSW arranged ambulance transport via [facility] to [facility]  .  RN to call [phone] for report prior to discharge.      Final next level of care: Skilled Nursing Facility Barriers to Discharge: No Barriers Identified   Patient Goals and CMS Choice        Discharge Placement              Patient chooses bed at:  ([facility]) Patient to be transferred to facility by: [facility] Name of family member notified: daughter Patient and family notified of of transfer: 06/18/21  Discharge Plan and Services In-house Referral: Clinical Social Work   Post Acute Care Choice: [place]                               Social Determinants of Health (SDOH) Interventions     Readmission Risk Interventions     No data to display

## 2021-06-18 NOTE — Progress Notes (Signed)
Physical Therapy Treatment Patient Details Name: Greydon Betke MRN: [id number] DOB: 03-May-1940 Today's Date: 06/18/2021   History of Present Illness 81 y.o. male  s/p fall resulting in right femoral neck fracture, ultimately having a posterior approach total hip replcement 06/13/21. Medical history significant for hypertension, hyperlipidemia, TIA, GERD, hypothyroidism, anxiety, BPH, anemia, rheumatoid arthritis, essential tremor.    PT Comments    Pt was pleasant and motivated to participate during the session and put forth good effort throughout. Pt is able to complete sit to stand transfers w/ CGA and w/ verbal cuing for hand and foot placement in order to maintain hip precautions. Pt ambulated out into the hallway 42ft using RW w/ CGA and with good stability and no LOB. Pt is able to effectively weightbear through RLE w/ min UE support from RW. Pt was able to safely ascend/descend steps CGA w/ good concentric/eccentric control using LLE and following verbal cues for hand placement for better stability using handrails. Currently pt will not have 24hr assist at home and will benefit from PT services in a SNF setting upon discharge to safely address deficits listed in patient problem list for decreased caregiver assistance and eventual return to PLOF.   Recommendations for follow up therapy are one component of a multi-disciplinary discharge planning process, led by the attending physician.  Recommendations may be updated based on patient status, additional functional criteria and insurance authorization.  Follow Up Recommendations  Skilled nursing-short term rehab (<3 hours/day)     Assistance Recommended at Discharge Intermittent Supervision/Assistance  Patient can return home with the following A little help with walking and/or transfers;A lot of help with bathing/dressing/bathroom;Assistance with cooking/housework;Assist for transportation;Help with stairs or ramp for entrance;A little help  with bathing/dressing/bathroom   Equipment Recommendations  Rolling walker (2 wheels);BSC/3in1    Recommendations for Other Services       Precautions / Restrictions Precautions Precautions: Posterior Hip;Fall Precaution Booklet Issued: Yes (comment) Restrictions Weight Bearing Restrictions: Yes RLE Weight Bearing: Weight bearing as tolerated     Mobility  Bed Mobility               General bed mobility comments: Pt up in recliner at start/end of session    Transfers Overall transfer level: Needs assistance Equipment used: Rolling walker (2 wheels) Transfers: Sit to/from Stand Sit to Stand: Min guard           General transfer comment: verbal cues for hand and foot placement to maintain hip precautions    Ambulation/Gait Ambulation/Gait assistance: Min guard Gait Distance (Feet): 75 Feet Assistive device: Rolling walker (2 wheels) Gait Pattern/deviations: Step-to pattern, Decreased stance time - right, Decreased step length - right, Decreased step length - left Gait velocity: decreased     General Gait Details: pt able to tolerate weight bearing through RLE w/ decreased stance time   Stairs Stairs: Yes Stairs assistance: Min guard Stair Management: Two rails, Step to pattern Number of Stairs: 6 General stair comments: Good concentric/eccentric control using LLE and no LOB   Wheelchair Mobility    Modified Rankin (Stroke Patients Only)       Balance Overall balance assessment: Needs assistance Sitting-balance support: No upper extremity supported, Feet supported Sitting balance-Leahy Scale: Good     Standing balance support: Bilateral upper extremity supported, During functional activity Standing balance-Leahy Scale: Fair                              Cognition  Arousal/Alertness: Awake/alert Behavior During Therapy: WFL for tasks assessed/performed Overall Cognitive Status: Within Functional Limits for tasks assessed                                           Exercises Other Exercises Other Exercises: Stair training for sequencing to safely ascend/descend    General Comments        Pertinent Vitals/Pain Pain Assessment Pain Assessment: 0-10 Pain Score: 3  Pain Location: right hip Pain Descriptors / Indicators: Aching, Sore Pain Intervention(s): Monitored during session, Premedicated before session, Repositioned    Home Living                          Prior Function            PT Goals (current goals can now be found in the care plan section) Progress towards PT goals: Progressing toward goals    Frequency    7X/week      PT Plan Current plan remains appropriate    Co-evaluation              AM-PAC PT "6 Clicks" Mobility   Outcome Measure  Help needed turning from your back to your side while in a flat bed without using bedrails?: A Little Help needed moving from lying on your back to sitting on the side of a flat bed without using bedrails?: A Little Help needed moving to and from a bed to a chair (including a wheelchair)?: A Little Help needed standing up from a chair using your arms (e.g., wheelchair or bedside chair)?: A Little Help needed to walk in hospital room?: A Little Help needed climbing 3-5 steps with a railing? : A Little 6 Click Score: 18    End of Session Equipment Utilized During Treatment: Gait belt Activity Tolerance: Patient tolerated treatment well;No increased pain Patient left: with chair alarm set;with call bell/phone within reach;in chair Nurse Communication: Mobility status PT Visit Diagnosis: Unsteadiness on feet (R26.81);Other abnormalities of gait and mobility (R26.89);History of falling (Z91.81);Muscle weakness (generalized) (M62.81);Difficulty in walking, not elsewhere classified (R26.2)     Time: 5176-1607 PT Time Calculation (min) (ACUTE ONLY): 26 min  Charges:                        Marica Otter,  SPT  06/18/2021, 1:44 PM

## 2021-08-14 ENCOUNTER — Other Ambulatory Visit
Admission: RE | Admit: 2021-08-14 | Discharge: 2021-08-14 | Disposition: A | Discharge status: Home / Self Care | Payer: Medicare HMO | Source: Ambulatory Visit | Attending: Rheumatology | Admitting: Rheumatology

## 2021-08-14 DIAGNOSIS — M0579 Rheumatoid arthritis with rheumatoid factor of multiple sites without organ or systems involvement: Secondary | ICD-10-CM | POA: Insufficient documentation

## 2021-08-14 DIAGNOSIS — G5793 Unspecified mononeuropathy of bilateral lower limbs: Secondary | ICD-10-CM | POA: Insufficient documentation

## 2021-08-14 DIAGNOSIS — Z7960 Long term (current) use of unspecified immunomodulators and immunosuppressants: Secondary | ICD-10-CM | POA: Insufficient documentation

## 2021-08-14 DIAGNOSIS — M1711 Unilateral primary osteoarthritis, right knee: Secondary | ICD-10-CM | POA: Diagnosis present

## 2021-08-14 LAB — SYNOVIAL CELL COUNT + DIFF, W/ CRYSTALS
Crystals, Fluid: NONE SEEN
Eosinophils-Synovial: 0 %
Lymphocytes-Synovial Fld: 69 %
Monocyte-Macrophage-Synovial Fluid: 26 %
Neutrophil, Synovial: 5 %
WBC, Synovial: 3601 /mm3 — ABNORMAL HIGH (ref 0–200)

## 2021-12-17 ENCOUNTER — Ambulatory Visit
Admission: RE | Admit: 2021-12-17 | Discharge: 2021-12-17 | Disposition: A | Discharge status: Home / Self Care | Payer: Medicare HMO | Source: Ambulatory Visit | Attending: Rheumatology | Admitting: Rheumatology

## 2021-12-17 ENCOUNTER — Other Ambulatory Visit: Payer: Self-pay | Admitting: Rheumatology

## 2021-12-17 DIAGNOSIS — M79604 Pain in right leg: Secondary | ICD-10-CM

## 2022-03-21 ENCOUNTER — Other Ambulatory Visit
Admission: RE | Admit: 2022-03-21 | Discharge: 2022-03-21 | Disposition: A | Discharge status: Home / Self Care | Payer: Medicare HMO | Source: Ambulatory Visit | Attending: Rheumatology | Admitting: Rheumatology

## 2022-03-21 DIAGNOSIS — M175 Other unilateral secondary osteoarthritis of knee: Secondary | ICD-10-CM | POA: Diagnosis present

## 2022-03-21 DIAGNOSIS — G5793 Unspecified mononeuropathy of bilateral lower limbs: Secondary | ICD-10-CM | POA: Insufficient documentation

## 2022-03-21 DIAGNOSIS — Z79899 Other long term (current) drug therapy: Secondary | ICD-10-CM | POA: Diagnosis present

## 2022-03-21 DIAGNOSIS — M0579 Rheumatoid arthritis with rheumatoid factor of multiple sites without organ or systems involvement: Secondary | ICD-10-CM | POA: Diagnosis present

## 2022-03-21 DIAGNOSIS — M25461 Effusion, right knee: Secondary | ICD-10-CM | POA: Diagnosis present

## 2022-03-21 LAB — SYNOVIAL CELL COUNT + DIFF, W/ CRYSTALS
Crystals, Fluid: NONE SEEN
Eosinophils-Synovial: 0 %
Lymphocytes-Synovial Fld: 25 %
Monocyte-Macrophage-Synovial Fluid: 70 %
Neutrophil, Synovial: 5 %
WBC, Synovial: 114 /mm3 (ref 0–200)

## 2022-06-24 ENCOUNTER — Emergency Department: Payer: Medicare HMO

## 2022-06-24 ENCOUNTER — Telehealth: Payer: Self-pay | Admitting: Neurosurgery

## 2022-06-24 ENCOUNTER — Encounter: Payer: Self-pay | Admitting: Emergency Medicine

## 2022-06-24 ENCOUNTER — Inpatient Hospital Stay: Payer: Medicare HMO | Admitting: Anesthesiology

## 2022-06-24 ENCOUNTER — Other Ambulatory Visit: Payer: Self-pay

## 2022-06-24 ENCOUNTER — Inpatient Hospital Stay
Admission: EM | Admit: 2022-06-24 | Discharge: 2022-07-02 | DRG: 025 | Disposition: A | Discharge status: Skilled Nursing Facility | Payer: Medicare HMO | Attending: Internal Medicine | Admitting: Internal Medicine

## 2022-06-24 ENCOUNTER — Encounter
Admission: EM | Disposition: A | Discharge status: Skilled Nursing Facility | Payer: Self-pay | Source: Home / Self Care | Attending: Internal Medicine

## 2022-06-24 DIAGNOSIS — Z87891 Personal history of nicotine dependence: Secondary | ICD-10-CM | POA: Diagnosis not present

## 2022-06-24 DIAGNOSIS — N179 Acute kidney failure, unspecified: Secondary | ICD-10-CM | POA: Diagnosis not present

## 2022-06-24 DIAGNOSIS — Z7952 Long term (current) use of systemic steroids: Secondary | ICD-10-CM

## 2022-06-24 DIAGNOSIS — Z8249 Family history of ischemic heart disease and other diseases of the circulatory system: Secondary | ICD-10-CM | POA: Diagnosis not present

## 2022-06-24 DIAGNOSIS — Z79890 Hormone replacement therapy (postmenopausal): Secondary | ICD-10-CM

## 2022-06-24 DIAGNOSIS — I959 Hypotension, unspecified: Secondary | ICD-10-CM | POA: Diagnosis not present

## 2022-06-24 DIAGNOSIS — S065XAA Traumatic subdural hemorrhage with loss of consciousness status unknown, initial encounter: Secondary | ICD-10-CM | POA: Diagnosis present

## 2022-06-24 DIAGNOSIS — I10 Essential (primary) hypertension: Secondary | ICD-10-CM | POA: Diagnosis present

## 2022-06-24 DIAGNOSIS — Y92009 Unspecified place in unspecified non-institutional (private) residence as the place of occurrence of the external cause: Secondary | ICD-10-CM

## 2022-06-24 DIAGNOSIS — M069 Rheumatoid arthritis, unspecified: Secondary | ICD-10-CM | POA: Diagnosis present

## 2022-06-24 DIAGNOSIS — R4182 Altered mental status, unspecified: Secondary | ICD-10-CM | POA: Diagnosis not present

## 2022-06-24 DIAGNOSIS — S01112A Laceration without foreign body of left eyelid and periocular area, initial encounter: Secondary | ICD-10-CM | POA: Diagnosis present

## 2022-06-24 DIAGNOSIS — W19XXXA Unspecified fall, initial encounter: Secondary | ICD-10-CM

## 2022-06-24 DIAGNOSIS — I454 Nonspecific intraventricular block: Secondary | ICD-10-CM | POA: Diagnosis not present

## 2022-06-24 DIAGNOSIS — Z23 Encounter for immunization: Secondary | ICD-10-CM | POA: Diagnosis present

## 2022-06-24 DIAGNOSIS — G935 Compression of brain: Secondary | ICD-10-CM

## 2022-06-24 DIAGNOSIS — Z79899 Other long term (current) drug therapy: Secondary | ICD-10-CM

## 2022-06-24 DIAGNOSIS — S065X9A Traumatic subdural hemorrhage with loss of consciousness of unspecified duration, initial encounter: Principal | ICD-10-CM

## 2022-06-24 DIAGNOSIS — R7989 Other specified abnormal findings of blood chemistry: Secondary | ICD-10-CM | POA: Diagnosis present

## 2022-06-24 DIAGNOSIS — J439 Emphysema, unspecified: Secondary | ICD-10-CM | POA: Diagnosis present

## 2022-06-24 DIAGNOSIS — Z96641 Presence of right artificial hip joint: Secondary | ICD-10-CM | POA: Diagnosis present

## 2022-06-24 DIAGNOSIS — D62 Acute posthemorrhagic anemia: Secondary | ICD-10-CM | POA: Diagnosis not present

## 2022-06-24 DIAGNOSIS — G514 Facial myokymia: Secondary | ICD-10-CM | POA: Diagnosis not present

## 2022-06-24 DIAGNOSIS — G4700 Insomnia, unspecified: Secondary | ICD-10-CM | POA: Diagnosis not present

## 2022-06-24 DIAGNOSIS — Z800 Family history of malignant neoplasm of digestive organs: Secondary | ICD-10-CM

## 2022-06-24 DIAGNOSIS — D72829 Elevated white blood cell count, unspecified: Secondary | ICD-10-CM | POA: Diagnosis present

## 2022-06-24 DIAGNOSIS — D649 Anemia, unspecified: Secondary | ICD-10-CM | POA: Diagnosis not present

## 2022-06-24 DIAGNOSIS — E785 Hyperlipidemia, unspecified: Secondary | ICD-10-CM | POA: Diagnosis present

## 2022-06-24 DIAGNOSIS — E871 Hypo-osmolality and hyponatremia: Secondary | ICD-10-CM | POA: Diagnosis not present

## 2022-06-24 DIAGNOSIS — Z7982 Long term (current) use of aspirin: Secondary | ICD-10-CM

## 2022-06-24 DIAGNOSIS — Z9049 Acquired absence of other specified parts of digestive tract: Secondary | ICD-10-CM

## 2022-06-24 DIAGNOSIS — F419 Anxiety disorder, unspecified: Secondary | ICD-10-CM | POA: Diagnosis present

## 2022-06-24 DIAGNOSIS — Z888 Allergy status to other drugs, medicaments and biological substances status: Secondary | ICD-10-CM

## 2022-06-24 DIAGNOSIS — E039 Hypothyroidism, unspecified: Secondary | ICD-10-CM | POA: Diagnosis present

## 2022-06-24 DIAGNOSIS — R338 Other retention of urine: Secondary | ICD-10-CM | POA: Diagnosis not present

## 2022-06-24 DIAGNOSIS — R339 Retention of urine, unspecified: Secondary | ICD-10-CM | POA: Diagnosis not present

## 2022-06-24 DIAGNOSIS — Z881 Allergy status to other antibiotic agents status: Secondary | ICD-10-CM

## 2022-06-24 DIAGNOSIS — I361 Nonrheumatic tricuspid (valve) insufficiency: Secondary | ICD-10-CM | POA: Diagnosis not present

## 2022-06-24 DIAGNOSIS — R569 Unspecified convulsions: Secondary | ICD-10-CM | POA: Diagnosis not present

## 2022-06-24 DIAGNOSIS — T446X5A Adverse effect of alpha-adrenoreceptor antagonists, initial encounter: Secondary | ICD-10-CM | POA: Diagnosis not present

## 2022-06-24 HISTORY — PX: CRANIOTOMY: SHX93

## 2022-06-24 LAB — BASIC METABOLIC PANEL
Anion gap: 11 (ref 5–15)
BUN: 22 mg/dL (ref 8–23)
CO2: 26 mmol/L (ref 22–32)
Calcium: 9.1 mg/dL (ref 8.9–10.3)
Chloride: 100 mmol/L (ref 98–111)
Creatinine, Ser: 1.08 mg/dL (ref 0.61–1.24)
GFR, Estimated: >60 mL/min (ref >60)
Glucose, Bld: 99 mg/dL (ref 70–99)
Potassium: 4.2 mmol/L (ref 3.5–5.1)
Sodium: 137 mmol/L (ref 135–145)

## 2022-06-24 LAB — CBC WITH DIFFERENTIAL/PLATELET
Abs Immature Granulocytes: 0.2 10*3/uL — ABNORMAL HIGH (ref 0.00–0.07)
Basophils Absolute: 0.1 10*3/uL (ref 0.0–0.1)
Basophils Relative: 0 %
Eosinophils Absolute: 0.2 10*3/uL (ref 0.0–0.5)
Eosinophils Relative: 2 %
HCT: 36.7 % — ABNORMAL LOW (ref 39.0–52.0)
Hemoglobin: 11.4 g/dL — ABNORMAL LOW (ref 13.0–17.0)
Immature Granulocytes: 2 %
Lymphocytes Relative: 9 %
Lymphs Abs: 1.1 10*3/uL (ref 0.7–4.0)
MCH: 31 pg (ref 26.0–34.0)
MCHC: 31.1 g/dL (ref 30.0–36.0)
MCV: 99.7 fL (ref 80.0–100.0)
Monocytes Absolute: 1.4 10*3/uL — ABNORMAL HIGH (ref 0.1–1.0)
Monocytes Relative: 11 %
Neutro Abs: 9.6 10*3/uL — ABNORMAL HIGH (ref 1.7–7.7)
Neutrophils Relative %: 76 %
Platelets: 258 10*3/uL (ref 150–400)
RBC: 3.68 MIL/uL — ABNORMAL LOW (ref 4.22–5.81)
RDW: 15.2 % (ref 11.5–15.5)
WBC: 12.6 10*3/uL — ABNORMAL HIGH (ref 4.0–10.5)
nRBC: 0 % (ref 0.0–0.2)

## 2022-06-24 LAB — MRSA NEXT GEN BY PCR, NASAL: MRSA by PCR Next Gen: NOT DETECTED

## 2022-06-24 LAB — GLUCOSE, CAPILLARY: Glucose-Capillary: 147 mg/dL — ABNORMAL HIGH (ref 70–99)

## 2022-06-24 LAB — TROPONIN I (HIGH SENSITIVITY)
Troponin I (High Sensitivity): 18 ng/L — ABNORMAL HIGH (ref <18)
Troponin I (High Sensitivity): 61 ng/L — ABNORMAL HIGH (ref <18)

## 2022-06-24 LAB — PROTIME-INR
INR: 1 (ref 0.8–1.2)
Prothrombin Time: 13.9 seconds (ref 11.4–15.2)

## 2022-06-24 LAB — APTT: aPTT: 28 seconds (ref 24–36)

## 2022-06-24 SURGERY — CRANIOTOMY HEMATOMA EVACUATION SUBDURAL
Anesthesia: General | Site: Head | Laterality: Right

## 2022-06-24 MED ORDER — ONDANSETRON HCL 4 MG/2ML IJ SOLN
INTRAMUSCULAR | Status: DC | PRN
  Administered 2022-06-24: 4 mg via INTRAVENOUS

## 2022-06-24 MED ORDER — 0.9 % SODIUM CHLORIDE (POUR BTL) OPTIME
TOPICAL | Status: DC | PRN
  Administered 2022-06-24: 1000 mL

## 2022-06-24 MED ORDER — SODIUM CHLORIDE 0.9 % IV SOLN: INTRAVENOUS | Status: DC | PRN

## 2022-06-24 MED ORDER — PROPOFOL 10 MG/ML IV BOLUS
INTRAVENOUS | Status: AC
  Filled 2022-06-24: qty 20

## 2022-06-24 MED ORDER — LACTATED RINGERS IV SOLN: INTRAVENOUS | Status: DC | PRN

## 2022-06-24 MED ORDER — LIDOCAINE HCL (CARDIAC) PF 100 MG/5ML IV SOSY
PREFILLED_SYRINGE | INTRAVENOUS | Status: DC | PRN
  Administered 2022-06-24: 80 mg via INTRAVENOUS

## 2022-06-24 MED ORDER — DOCUSATE SODIUM 100 MG PO CAPS
100.0000 mg | ORAL_CAPSULE | Freq: Two times a day (BID) | ORAL | Status: DC | PRN
  Administered 2022-06-26 – 2022-06-28 (×2): 100 mg via ORAL
  Filled 2022-06-24 (×2): qty 1

## 2022-06-24 MED ORDER — BUPIVACAINE HCL (PF) 0.5 % IJ SOLN
INTRAMUSCULAR | Status: AC
  Filled 2022-06-24: qty 30

## 2022-06-24 MED ORDER — CHLORHEXIDINE GLUCONATE CLOTH 2 % EX PADS
6.0000 | MEDICATED_PAD | Freq: Every day | CUTANEOUS | Status: DC
  Administered 2022-06-24 – 2022-06-27 (×2): 6 via TOPICAL

## 2022-06-24 MED ORDER — LIDOCAINE-EPINEPHRINE 1 %-1:100000 IJ SOLN
INTRAMUSCULAR | Status: AC
  Filled 2022-06-24: qty 1

## 2022-06-24 MED ORDER — VASOPRESSIN 20 UNIT/ML IV SOLN
INTRAVENOUS | Status: DC | PRN
  Administered 2022-06-24 (×4): 2 [IU] via INTRAVENOUS

## 2022-06-24 MED ORDER — SURGIRINSE WOUND IRRIGATION SYSTEM - OPTIME
TOPICAL | Status: DC | PRN
  Administered 2022-06-24: 450 mL

## 2022-06-24 MED ORDER — ROCURONIUM BROMIDE 10 MG/ML (PF) SYRINGE
PREFILLED_SYRINGE | INTRAVENOUS | Status: AC
  Filled 2022-06-24: qty 10

## 2022-06-24 MED ORDER — GABAPENTIN 100 MG PO CAPS
100.0000 mg | ORAL_CAPSULE | Freq: Two times a day (BID) | ORAL | Status: DC
  Administered 2022-06-24 – 2022-07-02 (×16): 100 mg via ORAL
  Filled 2022-06-24 (×16): qty 1

## 2022-06-24 MED ORDER — THROMBIN 5000 UNITS EX SOLR
CUTANEOUS | Status: AC
  Filled 2022-06-24: qty 5000

## 2022-06-24 MED ORDER — LIDOCAINE-EPINEPHRINE 1 %-1:100000 IJ SOLN
INTRAMUSCULAR | Status: DC | PRN
  Administered 2022-06-24: 8 mL

## 2022-06-24 MED ORDER — LABETALOL HCL 5 MG/ML IV SOLN
20.0000 mg | Freq: Once | INTRAVENOUS | Status: AC
  Administered 2022-06-24: 20 mg via INTRAVENOUS
  Filled 2022-06-24: qty 4

## 2022-06-24 MED ORDER — DEXAMETHASONE SODIUM PHOSPHATE 10 MG/ML IJ SOLN
INTRAMUSCULAR | Status: DC | PRN
  Administered 2022-06-24: 10 mg via INTRAVENOUS

## 2022-06-24 MED ORDER — BACITRACIN ZINC 500 UNIT/GM EX OINT
TOPICAL_OINTMENT | CUTANEOUS | Status: AC
  Filled 2022-06-24: qty 28.35

## 2022-06-24 MED ORDER — CLORAZEPATE DIPOTASSIUM 7.5 MG PO TABS
7.5000 mg | ORAL_TABLET | Freq: Three times a day (TID) | ORAL | Status: DC
  Administered 2022-06-24 – 2022-06-29 (×14): 7.5 mg via ORAL
  Filled 2022-06-24 (×14): qty 1

## 2022-06-24 MED ORDER — GELATIN ABSORBABLE 100 CM EX MISC
CUTANEOUS | Status: AC
  Filled 2022-06-24: qty 1

## 2022-06-24 MED ORDER — LIDOCAINE HCL (PF) 2 % IJ SOLN
INTRAMUSCULAR | Status: AC
  Filled 2022-06-24: qty 5

## 2022-06-24 MED ORDER — MANNITOL 20 % IV SOLN
1.0000 g/kg | Freq: Once | INTRAVENOUS | Status: AC
  Administered 2022-06-24: 75 g via INTRAVENOUS
  Filled 2022-06-24 (×2): qty 375

## 2022-06-24 MED ORDER — METHOCARBAMOL 500 MG PO TABS: 500.0000 mg | ORAL_TABLET | Freq: Three times a day (TID) | ORAL | Status: DC | PRN

## 2022-06-24 MED ORDER — FENTANYL CITRATE (PF) 100 MCG/2ML IJ SOLN
INTRAMUSCULAR | Status: AC
  Filled 2022-06-24: qty 2

## 2022-06-24 MED ORDER — ACETAMINOPHEN 10 MG/ML IV SOLN
INTRAVENOUS | Status: AC
  Filled 2022-06-24: qty 100

## 2022-06-24 MED ORDER — PHENYLEPHRINE 80 MCG/ML (10ML) SYRINGE FOR IV PUSH (FOR BLOOD PRESSURE SUPPORT)
PREFILLED_SYRINGE | INTRAVENOUS | Status: DC | PRN
  Administered 2022-06-24: 80 ug via INTRAVENOUS
  Administered 2022-06-24 (×3): 160 ug via INTRAVENOUS
  Administered 2022-06-24: 80 ug via INTRAVENOUS

## 2022-06-24 MED ORDER — ROCURONIUM BROMIDE 100 MG/10ML IV SOLN
INTRAVENOUS | Status: DC | PRN
  Administered 2022-06-24: 20 mg via INTRAVENOUS
  Administered 2022-06-24: 30 mg via INTRAVENOUS

## 2022-06-24 MED ORDER — PHENYLEPHRINE HCL-NACL 20-0.9 MG/250ML-% IV SOLN
INTRAVENOUS | Status: AC
  Filled 2022-06-24: qty 250

## 2022-06-24 MED ORDER — PROPOFOL 10 MG/ML IV BOLUS
INTRAVENOUS | Status: DC | PRN
  Administered 2022-06-24: 50 mg via INTRAVENOUS

## 2022-06-24 MED ORDER — FOLIC ACID 1 MG PO TABS
2.0000 mg | ORAL_TABLET | Freq: Every day | ORAL | Status: DC
  Administered 2022-06-25 – 2022-07-02 (×8): 2 mg via ORAL
  Filled 2022-06-24 (×8): qty 2

## 2022-06-24 MED ORDER — BUPIVACAINE LIPOSOME 1.3 % IJ SUSP
INTRAMUSCULAR | Status: AC
  Filled 2022-06-24: qty 20

## 2022-06-24 MED ORDER — ACETAMINOPHEN 10 MG/ML IV SOLN
INTRAVENOUS | Status: DC | PRN
  Administered 2022-06-24: 1000 mg via INTRAVENOUS

## 2022-06-24 MED ORDER — TETANUS-DIPHTH-ACELL PERTUSSIS 5-2.5-18.5 LF-MCG/0.5 IM SUSY
0.5000 mL | PREFILLED_SYRINGE | Freq: Once | INTRAMUSCULAR | Status: AC
  Administered 2022-06-24: 0.5 mL via INTRAMUSCULAR
  Filled 2022-06-24: qty 0.5

## 2022-06-24 MED ORDER — DIPHENOXYLATE-ATROPINE 2.5-0.025 MG PO TABS: 2.0000 | ORAL_TABLET | Freq: Three times a day (TID) | ORAL | Status: DC | PRN

## 2022-06-24 MED ORDER — BUPIVACAINE-EPINEPHRINE (PF) 0.5% -1:200000 IJ SOLN
INTRAMUSCULAR | Status: AC
  Filled 2022-06-24: qty 10

## 2022-06-24 MED ORDER — MANNITOL 20 % IV SOLN
1.0000 g/kg | Freq: Once | INTRAVENOUS | Status: DC
  Filled 2022-06-24 (×2): qty 250

## 2022-06-24 MED ORDER — BACITRACIN 500 UNIT/GM EX OINT
TOPICAL_OINTMENT | CUTANEOUS | Status: DC | PRN
  Administered 2022-06-24: 1 via TOPICAL

## 2022-06-24 MED ORDER — LABETALOL HCL 5 MG/ML IV SOLN
10.0000 mg | Freq: Once | INTRAVENOUS | Status: AC
  Administered 2022-06-24: 10 mg via INTRAVENOUS
  Filled 2022-06-24: qty 4

## 2022-06-24 MED ORDER — SUGAMMADEX SODIUM 200 MG/2ML IV SOLN
INTRAVENOUS | Status: DC | PRN
  Administered 2022-06-24: 260 mg via INTRAVENOUS

## 2022-06-24 MED ORDER — LISINOPRIL 5 MG PO TABS
5.0000 mg | ORAL_TABLET | Freq: Two times a day (BID) | ORAL | Status: DC
  Administered 2022-06-24 – 2022-06-28 (×6): 5 mg via ORAL
  Filled 2022-06-24 (×9): qty 1

## 2022-06-24 MED ORDER — FLUTICASONE PROPIONATE 50 MCG/ACT NA SUSP: 2.0000 | Freq: Every day | NASAL | Status: DC | PRN

## 2022-06-24 MED ORDER — SURGIFLO WITH THROMBIN (HEMOSTATIC MATRIX KIT) OPTIME
TOPICAL | Status: DC | PRN
  Administered 2022-06-24: 1 via TOPICAL

## 2022-06-24 MED ORDER — ATORVASTATIN CALCIUM 20 MG PO TABS
20.0000 mg | ORAL_TABLET | Freq: Every day | ORAL | Status: DC
  Administered 2022-06-25 – 2022-07-02 (×8): 20 mg via ORAL
  Filled 2022-06-24 (×8): qty 1

## 2022-06-24 MED ORDER — GLYCOPYRROLATE 0.2 MG/ML IJ SOLN
INTRAMUSCULAR | Status: DC | PRN
  Administered 2022-06-24: .2 mg via INTRAVENOUS

## 2022-06-24 MED ORDER — EPHEDRINE SULFATE (PRESSORS) 50 MG/ML IJ SOLN
INTRAMUSCULAR | Status: DC | PRN
  Administered 2022-06-24: 10 mg via INTRAVENOUS

## 2022-06-24 MED ORDER — PHENYLEPHRINE HCL-NACL 20-0.9 MG/250ML-% IV SOLN
INTRAVENOUS | Status: DC | PRN
  Administered 2022-06-24: 50 ug/min via INTRAVENOUS

## 2022-06-24 MED ORDER — ORAL CARE MOUTH RINSE: 15.0000 mL | OROMUCOSAL | Status: DC | PRN

## 2022-06-24 MED ORDER — LEVETIRACETAM 500 MG PO TABS
500.0000 mg | ORAL_TABLET | Freq: Two times a day (BID) | ORAL | Status: AC
  Administered 2022-06-24 – 2022-07-01 (×14): 500 mg via ORAL
  Filled 2022-06-24 (×15): qty 1

## 2022-06-24 MED ORDER — LEVETIRACETAM IN NACL 500 MG/100ML IV SOLN
500.0000 mg | Freq: Once | INTRAVENOUS | Status: AC
  Administered 2022-06-24: 500 mg via INTRAVENOUS
  Filled 2022-06-24: qty 100

## 2022-06-24 MED ORDER — PANTOPRAZOLE SODIUM 40 MG PO TBEC
40.0000 mg | DELAYED_RELEASE_TABLET | Freq: Every morning | ORAL | Status: DC
  Administered 2022-06-25 – 2022-07-02 (×8): 40 mg via ORAL
  Filled 2022-06-24 (×8): qty 1

## 2022-06-24 MED ORDER — CEFAZOLIN SODIUM-DEXTROSE 2-3 GM-%(50ML) IV SOLR
INTRAVENOUS | Status: DC | PRN
  Administered 2022-06-24: 2 g via INTRAVENOUS

## 2022-06-24 MED ORDER — METHOTREXATE SODIUM 2.5 MG PO TABS
12.5000 mg | ORAL_TABLET | ORAL | Status: DC
  Administered 2022-06-30: 12.5 mg via ORAL
  Filled 2022-06-24: qty 5

## 2022-06-24 MED ORDER — POLYETHYLENE GLYCOL 3350 17 G PO PACK
17.0000 g | PACK | Freq: Every day | ORAL | Status: DC | PRN
  Filled 2022-06-24 (×2): qty 1

## 2022-06-24 MED ORDER — AZELASTINE HCL 0.1 % NA SOLN
1.0000 | Freq: Two times a day (BID) | NASAL | Status: DC
  Administered 2022-06-24 – 2022-07-02 (×11): 1 via NASAL
  Filled 2022-06-24 (×2): qty 30

## 2022-06-24 MED ORDER — FENTANYL CITRATE (PF) 100 MCG/2ML IJ SOLN
INTRAMUSCULAR | Status: DC | PRN
  Administered 2022-06-24: 50 ug via INTRAVENOUS

## 2022-06-24 MED ORDER — MANNITOL 25 % IV SOLN
1.0000 g/kg | Freq: Once | INTRAVENOUS | Status: DC
  Filled 2022-06-24: qty 300

## 2022-06-24 MED ORDER — LEVOTHYROXINE SODIUM 100 MCG PO TABS
100.0000 ug | ORAL_TABLET | Freq: Every day | ORAL | Status: DC
  Administered 2022-06-25 – 2022-07-02 (×8): 100 ug via ORAL
  Filled 2022-06-24 (×8): qty 1

## 2022-06-24 SURGICAL SUPPLY — 81 items
AGENT HMST KT MTR STRL THRMB (HEMOSTASIS) ×1
APL PRP STRL LF ISPRP CHG 10.5 (MISCELLANEOUS) ×2
APPLICATOR CHLORAPREP 10.5 ORG (MISCELLANEOUS) ×2 IMPLANT
BASIN GRAD PLASTIC 32OZ STRL (MISCELLANEOUS) ×1 IMPLANT
BLADE CLIPPER SPEC (BLADE) ×1 IMPLANT
BLADE SURG 15 STRL LF DISP TIS (BLADE) ×1 IMPLANT
BLADE SURG 15 STRL SS (BLADE)
BULB RESERV EVAC DRAIN JP 100C (MISCELLANEOUS) IMPLANT
BUR ACORN 7.5 PRECISION (BURR) ×1 IMPLANT
BUR SPIRAL ROUTER 2.3 (BUR) ×1 IMPLANT
CNTNR URN SCR LID CUP LEK RST (MISCELLANEOUS) ×3 IMPLANT
CONT SPEC 4OZ STRL OR WHT (MISCELLANEOUS) ×3
COUNTER NEEDLE 20/40 LG (NEEDLE) ×1 IMPLANT
DRAIN CHANNEL JP 10F RND 20C F (MISCELLANEOUS) IMPLANT
DRAIN JP 10F RND SILICONE (MISCELLANEOUS) IMPLANT
DRAPE INCISE 23X17 STRL (DRAPES) ×2 IMPLANT
DRAPE INCISE IOBAN 23X17 STRL (DRAPES) ×1 IMPLANT
DRAPE INCISE IOBAN 66X45 STRL (DRAPES) ×1 IMPLANT
DRAPE SURG 17X11 SM STRL (DRAPES) ×4 IMPLANT
DRAPE WARM FLUID 44X44 (DRAPES) ×1 IMPLANT
DRSG TEGADERM 4X4.75 (GAUZE/BANDAGES/DRESSINGS) ×1 IMPLANT
DRSG TELFA 3X8 NADH STRL (GAUZE/BANDAGES/DRESSINGS) IMPLANT
ELECT CAUTERY BLADE TIP 2.5 (TIP) ×1
ELECT REM PT RETURN 9FT ADLT (ELECTROSURGICAL) ×1
ELECTRODE CAUTERY BLDE TIP 2.5 (TIP) ×1 IMPLANT
ELECTRODE REM PT RTRN 9FT ADLT (ELECTROSURGICAL) ×1 IMPLANT
GAUZE 4X4 16PLY ~~LOC~~+RFID DBL (SPONGE) ×3 IMPLANT
GAUZE XEROFORM 1X8 LF (GAUZE/BANDAGES/DRESSINGS) ×1 IMPLANT
GLOVE BIOGEL PI IND STRL 6.5 (GLOVE) ×1 IMPLANT
GLOVE BIOGEL PI IND STRL 8.5 (GLOVE) ×2 IMPLANT
GLOVE SURG SYN 6.5 ES PF (GLOVE) IMPLANT
GLOVE SURG SYN 6.5 PF PI (GLOVE) ×1 IMPLANT
GLOVE SURG SYN 8.5 E (GLOVE) ×8 IMPLANT
GLOVE SURG SYN 8.5 PF PI (GLOVE) ×4 IMPLANT
GOWN SRG LRG LVL 4 IMPRV REINF (GOWNS) ×1 IMPLANT
GOWN SRG XL LVL 3 NONREINFORCE (GOWNS) ×1 IMPLANT
GOWN STRL NON-REIN TWL XL LVL3 (GOWNS) ×1
GOWN STRL REIN LRG LVL4 (GOWNS) ×1
GRADUATE 1200CC STRL 31836 (MISCELLANEOUS) ×1 IMPLANT
GRAFT DURAGEN MATRIX 3WX3L (Graft) ×1 IMPLANT
GRAFT DURAGEN MATRIX 3X3 SNGL (Graft) IMPLANT
HEMOSTAT SURGICEL 2X14 (HEMOSTASIS) IMPLANT
HEMOSTAT SURGICEL 2X3 (HEMOSTASIS) IMPLANT
HEMOSTAT SURGICEL 4X8 (HEMOSTASIS) IMPLANT
HOLDER FOLEY CATH W/STRAP (MISCELLANEOUS) ×1 IMPLANT
HOOK STAY BLUNT/RETRACTOR 5M (MISCELLANEOUS) ×1 IMPLANT
KIT TURNOVER KIT A (KITS) ×1 IMPLANT
MANIFOLD NEPTUNE II (INSTRUMENTS) ×1 IMPLANT
MARKER SKIN DUAL TIP RULER LAB (MISCELLANEOUS) ×2 IMPLANT
MAT ABSORB FLUID 56X50 GRAY (MISCELLANEOUS) ×1 IMPLANT
NDL HYPO 22X1.5 SAFETY MO (MISCELLANEOUS) ×1 IMPLANT
NEEDLE HYPO 22X1.5 SAFETY MO (MISCELLANEOUS) ×1 IMPLANT
PACK CRANIOTOMY CUSTOM (CUSTOM PROCEDURE TRAY) ×1 IMPLANT
PAD ARMBOARD 7.5X6 YLW CONV (MISCELLANEOUS) ×2 IMPLANT
PIN MAYFIELD SKULL DISP (PIN) IMPLANT
PLATE 1.5 2HOLE LNG NEURO (Plate) IMPLANT
PLATE 1.5/0.5 18.5MM BURR HOLE (Plate) IMPLANT
SCREW SELF DRILL HT 1.5/4MM (Screw) IMPLANT
SET CATH VENT DRAIN 3-15 1.9D (DRAIN) IMPLANT
SHEET NEURO XL SOL CTL (MISCELLANEOUS) ×1 IMPLANT
SOL PREP PVP 2OZ (MISCELLANEOUS) ×1
SOL SCRUB PVP POV-IOD 4OZ 7.5% (MISCELLANEOUS) ×1
SOLUTION PREP PVP 2OZ (MISCELLANEOUS) ×1 IMPLANT
SOLUTION SCRB POV-IOD 4OZ 7.5% (MISCELLANEOUS) ×1 IMPLANT
STAPLER SKIN PROX 35W (STAPLE) ×2 IMPLANT
STRIP CLOSURE SKIN 1/2X4 (GAUZE/BANDAGES/DRESSINGS) IMPLANT
SURGIFLO W/THROMBIN 8M KIT (HEMOSTASIS) ×2 IMPLANT
SURGILUBE 2OZ TUBE FLIPTOP (MISCELLANEOUS) ×1 IMPLANT
SUT ETHILON 3-0 FS-10 30 BLK (SUTURE) ×1
SUT MNCRL 3-0 UNDYED SH (SUTURE) IMPLANT
SUT NURALON 4 0 TR CR/8 (SUTURE) ×1 IMPLANT
SUT VIC AB 2-0 CT1 18 (SUTURE) ×2 IMPLANT
SUTURE EHLN 3-0 FS-10 30 BLK (SUTURE) IMPLANT
SYR 10ML LL (SYRINGE) ×1 IMPLANT
SYR 20ML LL LF (SYRINGE) ×2 IMPLANT
TAPE CLOTH 3X10 WHT NS LF (GAUZE/BANDAGES/DRESSINGS) ×1 IMPLANT
TOWEL OR 17X26 4PK STRL BLUE (TOWEL DISPOSABLE) ×4 IMPLANT
TRAP FLUID SMOKE EVACUATOR (MISCELLANEOUS) ×1 IMPLANT
TRAY FOLEY SLVR 16FR TEMP STAT (SET/KITS/TRAYS/PACK) ×1 IMPLANT
WATER STERILE IRR 1000ML POUR (IV SOLUTION) ×3 IMPLANT
WATER STERILE IRR 500ML POUR (IV SOLUTION) ×1 IMPLANT

## 2022-06-24 NOTE — Progress Notes (Signed)
Preliminary Recs: Reviewed his CT with ED, given history of aspirin use would recommend a repeat head CT in 6 hrs to evaluate for any expansion. Will have a formal consult to follow.

## 2022-06-24 NOTE — Transfer of Care (Signed)
Immediate Anesthesia Transfer of Care Note  Patient: Mitchell Ayers  Procedure(s) Performed: CRANIOTOMY HEMATOMA EVACUATION SUBDURAL (Right: Head)  Patient Location: PACU  Anesthesia Type:General  Level of Consciousness: awake, drowsy, and patient cooperative  Airway & Oxygen Therapy: Patient Spontanous Breathing and Patient connected to face mask oxygen  Post-op Assessment: Report given to RN and Post -op Vital signs reviewed and stable  Post vital signs: Reviewed and stable  Last Vitals:  Vitals Value Taken Time  BP 117/68 06/24/22 1645  Temp    Pulse 79 06/24/22 1649  Resp 21 06/24/22 1649  SpO2 99 % 06/24/22 1649  Vitals shown include unvalidated device data.  Last Pain:  Vitals:   06/24/22 0509  TempSrc: Oral  PainSc:          Complications: No notable events documented.

## 2022-06-24 NOTE — Anesthesia Preprocedure Evaluation (Addendum)
Anesthesia Evaluation  Patient identified by MRN, date of birth, ID band Patient awake  General Assessment Comment:  Patient AO x 3, cognizant of the situation. I was able to obtain informed consent prior to induction of anesthesia. Patient with ecchymosis around left eye, and laceration with steristrips over left eyebrow. Patient told me he could not see me even though I was standing in front of him.  Reviewed: Allergy & Precautions, NPO status , Patient's Chart, lab work & pertinent test results  History of Anesthesia Complications Negative for: history of anesthetic complications  Airway Mallampati: II  TM Distance: >3 FB Neck ROM: Full    Dental  (+) Missing, Chipped   Pulmonary neg pulmonary ROS, neg sleep apnea, neg COPD, Patient abstained from smoking.Not current smoker, former smoker   Pulmonary exam normal breath sounds clear to auscultation       Cardiovascular Exercise Tolerance: Good METShypertension, Pt. on medications (-) CAD and (-) Past MI (-) dysrhythmias  Rhythm:Regular Rate:Normal - Systolic murmurs    Neuro/Psych  PSYCHIATRIC DISORDERS Anxiety     Enlarging subdural hematoma TIA   GI/Hepatic ,neg GERD  ,,(+)     (-) substance abuse    Endo/Other  neg diabetesHypothyroidism    Renal/GU negative Renal ROS     Musculoskeletal   Abdominal   Peds  Hematology   Anesthesia Other Findings Past Medical History: No date: Anxiety No date: HLD (hyperlipidemia) No date: HTN (hypertension) No date: Hypothyroid No date: Normocytic anemia No date: RA (rheumatoid arthritis) (HCC)  Reproductive/Obstetrics                             Anesthesia Physical Anesthesia Plan  ASA: 4 and emergent  Anesthesia Plan: General   Post-op Pain Management: Ofirmev IV (intra-op)*   Induction: Intravenous  PONV Risk Score and Plan: 2 and Ondansetron, Dexamethasone and Treatment may vary  due to age or medical condition  Airway Management Planned: Oral ETT and Video Laryngoscope Planned  Additional Equipment: Arterial line  Intra-op Plan:   Post-operative Plan: Extubation in OR and Possible Post-op intubation/ventilation  Informed Consent: I have reviewed the patients History and Physical, chart, labs and discussed the procedure including the risks, benefits and alternatives for the proposed anesthesia with the patient or authorized representative who has indicated his/her understanding and acceptance.     Dental advisory given  Plan Discussed with: CRNA and Surgeon  Anesthesia Plan Comments: (Discussed risks of anesthesia with patient, including PONV, sore throat, lip/dental/eye damage. Rare risks discussed as well, such as cardiorespiratory and neurological sequelae, and allergic reactions. Discussed possible prolonged intubation, though plan currently for extubation given good clinical picture. Possible arterial line. Discussed the role of CRNA in patient's perioperative care. Patient understands.)       Anesthesia Quick Evaluation

## 2022-06-24 NOTE — H&P (Signed)
NAME:  Mitchell Ayers, MRN:  [id number], DOB:  03-07-1940, LOS: 0 ADMISSION DATE:  06/24/2022, CONSULTATION DATE: 06/24/2022 REFERRING MD: Dr. Rosalia Hammers , CHIEF COMPLAINT: Fall    History of Present Illness:  This is an 82 yo male who presented to [facility] ER on 06/17 via EMS after hitting his nose resulting in a nosebleed following a mechanical fall.  Per ER notes pt ambulates with a walker at baseline and while using the walker last night it slipped out from under him.  Following the fall he also injured his left eye/eyebrow and developed upper back pain and left hip pain.  He does take aspirin at home.    ED Course  Upon arrival to the ER significant lab results were: glucose 106/troponin 18/wbc 12.6/hgb 11.4/hct 36.7.  CT Head revealed positive widespread right hemisphere subdural hematoma tracking onto the right tentorium and also inferiorly along the dorsal clivus toward the foramen magnum with associated mild intracranial mass effect 5 mm of leftward midline shift.  Neurosurgeon Dr. Katrinka Blazing consulted recommended repeating CT Head 6 hrs from initial CT to evaluate for evaluation.  Pt also hypertensive per neurosurgery recommendations recommended 20 mg labetalol and iv mannitol.  Repeat CT Head revealed enlarging subdural hematoma with right to left midline shift of 7 mm from 5 mm.  Neurosurgery recommended proceeding with emergent right craniotomy and pt/pts family consented.  PCCM team contacted for ICU admission.   Initial CT Head:  Positive for widespread right hemisphere Subdural hematoma, tracking onto the right tentorium and also inferiorly along the dorsal clivus toward the foramen magnum. SDH is up to 9 mm in thickness. Associated mild intracranial mass effect with 5 mm of leftward midline shift. No other acute traumatic injury to the brain. Left forehead scalp laceration and hematoma with No skull fracture identified. CT Cervical Spine: No acute traumatic injury identified in the cervical spine.  Hemorrhage from the ventral posterior fossa subdural hematoma likely tracks to the ventral cisterna magna and perhaps as far as C1. No other convincing cervical canal blood. 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 Emphysema (ICD10-J43.9). CT Maxillofacial: . No facial fracture identified. Medial left orbit and left forehead scalp soft tissue injury. Subdural hematoma at the central skull base and in the right hemisphere detailed separately. Repeat CT Head: Enlarging subdural hematoma: Increased size and extent of right hemisphere predominant SDH since 0522 hours, now up to 14 mm in thickness. New right posterior fossa and para falcine subdural blood, mildly increased dorsal clivus blood. And increased intracranial mass effect, leftward midline shift now 7 mm (previously 5 mm). Trace new IVH also. No ventriculomegaly. No convincing SAH or hemorrhagic contusion.No skull fracture identified.  Pertinent  Medical History  Anxiety  HLD HTN Hypothyroidism  Normocytic Anemia  Rheumatoid Arthritis   Significant Hospital Events: Including procedures, antibiotic start and stop dates in addition to other pertinent events   06/17: Pt admitted with subdural hematoma due to mechanical fall pending emergent right craniotomy per neurosurgery   Interim History / Subjective:  Pt complaint of left eye pain secondary to his fall.  Sbp ranging between 150 to 160's following multiple doses of labetalol   Objective   Blood pressure (!) 160/89, pulse 91, temperature 98.1 F (36.7 C), temperature source Oral, resp. rate 20, height 5\' 6"  (1.676 m), weight 66.2 kg, SpO2 100 %.       No intake or output data in the 24 hours ending 06/24/22 1347 Filed Weights   06/24/22 0509  Weight: 66.2 kg   Examination: General: Acutely-ill appearing male, NAD on RA  HENT: No JVD, left periorbital ecchymosis and edema Lungs: Clear throughout, even, non labored  Cardiovascular: NSR with BBB, no m/r/g, 2+  radial/2+ distal pulses, no extremity edema  Abdomen: +BS x4, soft, slight tenderness, non distended  Extremities: Normal bulk and tone, moves all extremities  Skin: left eye ecchymosis   Neuro: Alert and oriented, following commands, no neurological deficits, PERRLA  GU: Deferred   Resolved Hospital Problem list     Assessment & Plan:  #Subdural hematoma secondary to mechanical fall  Hx: Normocytic anemia  - Neurosurgery consulted appreciate input: pending emergent right craniotomy  - Neuro exams and HOA per neurosurgery recommendations  - Will need repeat CT Head postop per neurosurgery recs  - Recommended continuing empiric abx post craniotomy  - Trend CBC  - Monitor for s/sx of bleeding  - VTE px: SCD's; avoid chemical px for now   #HTN  #Mildly elevated troponin's likely secondary to demand ischemia  Hx: HLD  - Continuous telemetry monitoring  - Trend troponin's until peaked  - BP goal per neurosurgery recommendations  - Once able to take po's resume outpatient atorvastatin   #Hypothyroidism  - Resume outpatient synthroid once able to tolerate po's   #Rheumatoid arthritis  - Hold outpatient methotrexate for now   Best Practice (right click and "Reselect all SmartList Selections" daily)   Diet/type: NPO DVT prophylaxis: SCD GI prophylaxis: PPI Lines: N/A Foley:  N/A Code Status:  full code Last date of multidisciplinary goals of care discussion [N/A]  06/17: Updated pt and pts wife/daughter regarding pts condition and current plan of care  Labs   CBC: Recent Labs  Lab 06/24/22 0546  WBC 12.6*  NEUTROABS 9.6*  HGB 11.4*  HCT 36.7*  MCV 99.7  PLT 258    Basic Metabolic Panel: Recent Labs  Lab 06/24/22 0546  NA 137  K 4.2  CL 100  CO2 26  GLUCOSE 99  BUN 22  CREATININE 1.08  CALCIUM 9.1   GFR: Estimated Creatinine Clearance: 46.8 mL/min (by C-G formula based on SCr of 1.08 mg/dL). Recent Labs  Lab 06/24/22 0546  WBC 12.6*    Liver  Function Tests: No results for input(s): "AST", "ALT", "ALKPHOS", "BILITOT", "PROT", "ALBUMIN" in the last 168 hours. No results for input(s): "LIPASE", "AMYLASE" in the last 168 hours. No results for input(s): "AMMONIA" in the last 168 hours.  ABG No results found for: "PHART", "PCO2ART", "PO2ART", "HCO3", "TCO2", "ACIDBASEDEF", "O2SAT"   Coagulation Profile: Recent Labs  Lab 06/24/22 0546  INR 1.0    Cardiac Enzymes: No results for input(s): "CKTOTAL", "CKMB", "CKMBINDEX", "TROPONINI" in the last 168 hours.  HbA1C: No results found for: "HGBA1C"  CBG: No results for input(s): "GLUCAP" in the last 168 hours.  Review of Systems: Positives in BOLD   Gen: fall, left eye pain, fever, chills, weight change, fatigue, night sweats HEENT: Denies blurred vision, double vision, hearing loss, tinnitus, sinus congestion, rhinorrhea, sore throat, neck stiffness, dysphagia PULM: Denies shortness of breath, cough, sputum production, hemoptysis, wheezing CV: Denies chest pain, edema, orthopnea, paroxysmal nocturnal dyspnea, palpitations GI: Denies abdominal pain, nausea, vomiting, diarrhea, hematochezia, melena, constipation, change in bowel habits GU: Denies dysuria, hematuria, polyuria, oliguria, urethral discharge Endocrine: Denies hot or cold intolerance, polyuria, polyphagia or appetite change Derm: Denies rash, dry skin, scaling or peeling skin change Heme: Denies easy bruising, bleeding, bleeding gums Musc: Upper back pain and left hip pain  Neuro: Denies headache, numbness, weakness, slurred speech, loss of memory or consciousness   Past Medical History:  He,  has a past medical history of Anxiety, HLD (hyperlipidemia), HTN (hypertension), Hypothyroid, Normocytic anemia, and RA (rheumatoid arthritis) (HCC).   Surgical History:   Past Surgical History:  Procedure Laterality Date   CHOLECYSTECTOMY     HIP ARTHROPLASTY Right 06/13/2021   Procedure: ARTHROPLASTY BIPOLAR HIP  (HEMIARTHROPLASTY);  Surgeon: Ross Marcus, MD;  Location: [facility] ORS;  Service: Orthopedics;  Laterality: Right;   Left knee surgery       Social History:   reports that he has quit smoking. His smoking use included cigarettes. He has never used smokeless tobacco. He reports that he does not currently use alcohol. He reports that he does not use drugs.   Family History:  His family history includes Hypertension in his mother; Parkinson's disease in his father; Stomach cancer in his father.   Allergies Allergies  Allergen Reactions   Tetracyclines & Related Anaphylaxis   Tamsulosin     Other reaction(s): Headache Left sided headache     Home Medications  Prior to Admission medications   Medication Sig Start Date End Date Taking? Authorizing Provider  atorvastatin (LIPITOR) 20 MG tablet Take 20 mg by mouth daily. 06/09/21  Yes [provider]  Azelastine HCl 137 MCG/SPRAY SOLN Place 1 spray into both nostrils 2 (two) times daily. 06/12/21  Yes [provider]  clorazepate (TRANXENE) 7.5 MG tablet Take 7.5 mg by mouth 3 (three) times daily. 05/22/21  Yes [provider]  clotrimazole-betamethasone (LOTRISONE) cream Apply 1 application. topically daily. 06/11/21  Yes [provider]  diphenoxylate-atropine (LOMOTIL) 2.5-0.025 MG tablet Take 2 tablets by mouth 3 (three) times daily as needed for diarrhea or loose stools. 06/12/21  Yes [provider]  fluticasone (FLONASE) 50 MCG/ACT nasal spray Place 2 sprays into both nostrils daily. 12/27/20  Yes [provider]  folic acid (FOLVITE) 1 MG tablet Take 2 mg by mouth daily. 10/05/20  Yes [provider]  gabapentin (NEURONTIN) 100 MG capsule Take 100 mg by mouth 2 (two) times daily. 06/10/21  Yes [provider]  levothyroxine (SYNTHROID) 100 MCG tablet Take 100 mcg by mouth daily. 06/09/21  Yes [provider]  lisinopril (ZESTRIL) 5 MG tablet Take 5 mg by mouth 2 (two)  times daily. 03/26/21  Yes [provider]  methocarbamol (ROBAXIN) 500 MG tablet Take 1 tablet (500 mg total) by mouth every 8 (eight) hours as needed for muscle spasms. 06/18/21  Yes Willeen Niece, MD  methotrexate (RHEUMATREX) 2.5 MG tablet Take 12.5 mg by mouth once a week. 12/25/20  Yes [provider]  pantoprazole (PROTONIX) 40 MG tablet Take 40 mg by mouth every morning. 04/13/21  Yes [provider]  predniSONE (DELTASONE) 10 MG tablet Take by mouth. Pred pak   Yes [provider]  amoxicillin (AMOXIL) 500 MG tablet Take 2,000 mg by mouth as directed. One hour prior to Dental Treatment Patient not taking: Reported on 06/24/2022 04/18/21   [provider]  aspirin EC 81 MG tablet Take 1 tablet (81 mg total) by mouth 2 (two) times daily. Swallow whole. Patient not taking: Reported on 06/24/2022 06/18/21   Willeen Niece, MD  HYDROcodone-acetaminophen (NORCO/VICODIN) 5-325 MG tablet Take 1-2 tablets by mouth every 4 (four) hours as needed for moderate pain (pain score 4-6). Patient not taking: Reported on 06/24/2022 06/14/21   Ross Marcus, MD     Critical care time:  40 minutes      Zada Girt, AGNP  Pulmonary/Critical Care Pager [phone] (please enter 7 digits) PCCM Consult Pager [phone] (please enter 7 digits)

## 2022-06-24 NOTE — Telephone Encounter (Signed)
Please place order and I will then call the patient, thanks.

## 2022-06-24 NOTE — ED Provider Notes (Signed)
[facility] Provider Note    Event Date/Time   First MD Initiated Contact with Patient 06/24/22 [phone]     (approximate)   History   Facial Injury  Level 5 caveat:  history/ROS limited by acute/critical illness  HPI Mitchell Ayers is a 82 y.o. male who presents for evaluation after a fall with some obvious traumatic injury to his face.  The patient is awake and alert at this time and is able to provide me with a history though somewhat limited.  He states he got up during the night to go to the bathroom and somehow tripped in the bathroom, falling and striking his face along the sharp edge of a wall.  He has a laceration to his left eyebrow and swelling and ecchymosis around the left eye.  He had a traumatic bloody nose when he came in initially but it stopped bleeding by the time I saw him.  He reports a headache and being somewhat sore in general including in his neck, but he said otherwise he feels okay.  He did not pass out, he does not believe, and he has no chest pain, shortness of breath, nausea, vomiting, nor abdominal pain.  He does not think he sustained any injuries to his arms nor his legs.  He has no acute numbness or weakness in his extremities.     Physical Exam   Triage Vital Signs: ED Triage Vitals  Enc Vitals Group     BP 06/24/22 0509 (!) 187/78     Pulse Rate 06/24/22 0509 77     Resp 06/24/22 0509 17     Temp 06/24/22 0509 98.1 F (36.7 C)     Temp Source 06/24/22 0509 Oral     SpO2 06/24/22 0509 98 %     Weight 06/24/22 0509 66.2 kg (146 lb)     Height 06/24/22 0509 1.676 m (5\' 6" )     Head Circumference --      Peak Flow --      Pain Score 06/24/22 0459 10     Pain Loc --      Pain Edu? --      Excl. in GC? --     Most recent vital signs: Vitals:   06/24/22 0600 06/24/22 0615  BP: (!) 188/83 (!) 167/87  Pulse: 73 73  Resp: (!) 22 (!) 21  Temp:    SpO2: 98% 100%    General: Awake, alert, able to provide a history and is  in no distress despite obvious traumatic injuries. HEENT: Laceration to medial left eyebrow that extends laterally in a more superficial manner.  The medial aspect is essentially a missing piece of tissue that is simply missing, with no obvious margin to repair the wound without causing significant cosmetic deformity.  No active bleeding.  No obvious head/scalp hematoma/contusion.  Patient has a contusion and ecchymosis around the medial part of his left orbit but the globe appears unaffected and his extraocular motion is intact.  Pupils are equal and reactive though somewhat sluggish.  No nystagmus.  He has dried blood in both nares but no active epistaxis. CV:  Good peripheral perfusion.  Regular rate and rhythm. Resp:  Normal effort. Speaking easily and comfortably, no accessory muscle usage nor intercostal retractions.  Lungs clear to auscultation. Abd:  No distention.  No tenderness to palpation of the abdomen. Other:  Patient is moving all 4 extremities, has no dysarthria or aphasia, no obvious focal neurological deficits.  He  seems a little bit confused about the current circumstances and he is perseverating with the nurse, though not with me, about a few things, such as taking some of his medication at home before having his fall.  Overall GCS is 14 for probable confusion.   ED Results / Procedures / Treatments   Labs (all labs ordered are listed, but only abnormal results are displayed) Labs Reviewed  CBC WITH DIFFERENTIAL/PLATELET - Abnormal; Notable for the following components:      Result Value   WBC 12.6 (*)    RBC 3.68 (*)    Hemoglobin 11.4 (*)    HCT 36.7 (*)    Neutro Abs 9.6 (*)    Monocytes Absolute 1.4 (*)    Abs Immature Granulocytes 0.20 (*)    All other components within normal limits  TROPONIN I (HIGH SENSITIVITY) - Abnormal; Notable for the following components:   Troponin I (High Sensitivity) 18 (*)    All other components within normal limits  BASIC METABOLIC  PANEL  PROTIME-INR  APTT     EKG  ED ECG REPORT I, Loleta Rose, the attending physician, personally viewed and interpreted this ECG.  Date: 06/24/2022 EKG Time: 5:46 AM Rate: 87 Rhythm: normal sinus rhythm QRS Axis: normal Intervals: Right bundle branch block ST/T Wave abnormalities: Non-specific ST segment / T-wave changes, but no clear evidence of acute ischemia. Narrative Interpretation: no definitive evidence of acute ischemia; does not meet STEMI criteria.    RADIOLOGY I personally viewed and interpreted the patient's imaging including CT head, CT cervical spine, CT maxillofacial, thoracic spine x-rays, hip and pelvis x-rays, and I cannot appreciate a subdural hemorrhage with midline shift on the head CT, but the other images are all negative for acute fracture, bleeding, dislocation, etc.  Dr. Margo Aye with radiology called and consulted by phone to inform me of the intracranial hemorrhage.   PROCEDURES:  Critical Care performed: Yes, see critical care procedure note(s)  .1-3 Lead EKG Interpretation  Performed by: Loleta Rose, MD Authorized by: Loleta Rose, MD     Interpretation: normal     ECG rate:  90   ECG rate assessment: normal     Rhythm: sinus rhythm     Ectopy: none     Conduction: normal   .Critical Care  Performed by: Loleta Rose, MD Authorized by: Loleta Rose, MD   Critical care provider statement:    Critical care time (minutes):  60   Critical care time was exclusive of:  Separately billable procedures and treating other patients   Critical care was necessary to treat or prevent imminent or life-threatening deterioration of the following conditions:  Trauma and CNS failure or compromise   Critical care was time spent personally by me on the following activities:  Development of treatment plan with patient or surrogate, evaluation of patient's response to treatment, examination of patient, obtaining history from patient or surrogate, ordering  and performing treatments and interventions, ordering and review of laboratory studies, ordering and review of radiographic studies, pulse oximetry, re-evaluation of patient's condition and review of old charts     IMPRESSION / MDM / ASSESSMENT AND PLAN / ED COURSE  I reviewed the triage vital signs and the nursing notes.                              Differential diagnosis includes, but is not limited to, intracranial hemorrhage, contusion, laceration, cervical spine fracture, other bony injury  or fracture, concussion, facial fractures including orbital injury..  Patient's presentation is most consistent with acute presentation with potential threat to life or bodily function.  Labs/studies ordered: High-sensitivity troponin, pro time-INR, APTT, basic metabolic panel, CBC with differential, head CT, cervical spine CT, maxillofacial CT, hip and pelvis x-rays, thoracic spine x-rays, EKG  Interventions/Medications given:  Medications  mannitol 20 % infusion 75 g (has no administration in time range)  Tdap (BOOSTRIX) injection 0.5 mL (has no administration in time range)  labetalol (NORMODYNE) injection 20 mg (20 mg Intravenous Given 06/24/22 0556)    (Note:  hospital course my include additional interventions and/or labs/studies not listed above.)   The patient initially appeared stable at the time of triage and after Megan , the first nurse, called me to verify, I authorized the initial imaging and lab work to begin while we were making a room for the patient.  I was informed by Dahlia Client the CT technologist that there was likely an intracranial hemorrhage.  I personally viewed and interpreted the CT scan and also saw the intracranial bleed.  The patient was already room so I saw him at the earliest opportunity.  I was reassured by his mental status and verified that he is not on anticoagulation.  However I will consult neurosurgery ASAP to discuss appropriate management and  disposition.  Notably the patient is severely hypertensive upon arrival with a systolic around 200 and a diastolic around 100.  Given the intracranial bleed I want to control that rapidly so I ordered labetalol 20 mg IV, put the patient's head of the bed at about 45 degrees, and ordered mannitol as documented above.  I will modify orders as needed after discussing case with neurosurgery.  The patient is on the cardiac monitor to evaluate for evidence of arrhythmia and/or significant heart rate changes.   Clinical Course as of 06/24/22 1814  Mon Jun 24, 2022  1610 Consulted by phone with Dr. Katrinka Blazing with neurosurgery.  Discussed case.  He will review and call me back. [CF]  [phone] (Delayed documentation due to multiple critical patients in the ED) I spoke with Dr. Katrinka Blazing who recommended a repeat CT scan and 6 hours.  He also said that typically he watches these patients for 24 hours if they are on baby aspirin.  I also pointed out the patient's uncontrolled hypertension for which I ordered labetalol 20 mg and IV mannitol.  I suggested admitting the patient to the ICU for observation and he thought that this was reasonable but also advised that Dr. Myer Haff would see the patient in the morning.  In the interim, Dr. Myer Haff evaluated the patient and said that he does not think the patient will need to be admitted.  He recommended keeping the systolic blood pressure below 540 with labetalol and repeating the CT scan in 6 hours.  If it is unchanged, he thinks the patient will be appropriate for discharge.  I am transferring ED care to Dr. Rosalia Hammers who is aware of the situation.  She will reassess the patient and will decide if the patient is appropriate for discharge based on his hypertension and the rest of the clinical picture in addition to the consultant recommendations. [CF]          Clinical Course User Index [CF] Loleta Rose, MD [NR] Trinna Post, MD     FINAL CLINICAL IMPRESSION(S) / ED DIAGNOSES    Final diagnoses:  Traumatic subdural hematoma with loss of consciousness, initial encounter (HCC)  Elevated troponin  level     Rx / DC Orders   ED Discharge Orders     None        Note:  This document was prepared using Dragon voice recognition software and may include unintentional dictation errors.   Loleta Rose, MD 06/24/22 [phone]

## 2022-06-24 NOTE — ED Notes (Signed)
Pt last ate at 7pm last night.

## 2022-06-24 NOTE — Progress Notes (Signed)
PT Cancellation Note  Patient Details Name: Mitchell Ayers MRN: [id number] DOB: [birth year]   Cancelled Treatment:    Reason Eval/Treat Not Completed: Medical issues which prohibited therapy (Consult received and chart reviewed. Patient noted with acute SDH on imaging; plans for repeat CT in 6 hours. Will hold evaluation and re-attempt at later time as medically appropriate.)   Altagracia Rone H. Manson Passey, PT, DPT, NCS 06/24/22, 9:10 AM [phone]

## 2022-06-24 NOTE — ED Provider Notes (Signed)
Care of this patient assumed from prior provider at 0700 pending repeat head CT, blood pressure monitoring, neurosurgery recommendations, and disposition.  Please see initial provider note for further details.    Briefly, this is an 82 year old male who was trying to get to the bathroom when he slipped and fell hitting a corner.  Takes a baby aspirin, no other anticoagulation.  On exam, patient was found to have a skin defect along his eyebrow that was not felt to be amenable for repair as well as some ecchymosis around the eye.  CT imaging was obtained which demonstrate widespread hemispheric subdural hematoma measuring up to 9 mm in thickness with mild mass effect.  No skull fracture noted.  No facial fractures or cervical spine fractures noted.  Hip x-Iyan Flett and thoracic spine x-Zaidy Absher without acute bony abnormalities.  Patient did present significantly hypertensive and was given  20 mg of labetalol as well as mannitol.  Case was reviewed with overnight neurosurgeon, Dr. Katrinka Blazing who recommended a repeat head CT at 6 hours and reported that the daytime neurosurgeon would also see the patient.  ICU have been consulted for admission, but patient was evaluated by Dr. Marcell Barlow who recommended a systolic blood pressure goal below 160 but that there was a possibility of discharge pending repeat head CT.  I did examine the patient at bedside.  On exam, he is awake, able to tell me that he tripped and fell earlier today.  His wife is present at bedside.  He reports pain over his face, denies other complaints.  Does have a skin defect along his eyebrow that I did place Steri-Strips over.  He has had recurrent hypertension with systolics up to the 180s.  Ordered for labetalol 10 mg x 2. His initial lab work did demonstrate slightly elevated troponin at 18 which up trended to 61.  Possibly related to significant hypertension.  Systolic goal of 295 related to his intracranial bleed is about a 25% drop from highest systolic so  do not think patient needs more strict blood pressure control at this time.  Clinical Course as of 06/24/22 1546  Mon Jun 24, 2022  0545 Consulted by phone with Dr. Katrinka Blazing with neurosurgery.  Discussed case.  He will review and call me back. [CF]  [phone] (Delayed documentation due to multiple critical patients in the ED) I spoke with Dr. Katrinka Blazing who recommended a repeat CT scan and 6 hours.  He also said that typically he watches these patients for 24 hours if they are on baby aspirin.  I also pointed out the patient's uncontrolled hypertension for which I ordered labetalol 20 mg and IV mannitol.  I suggested admitting the patient to the ICU for observation and he thought that this was reasonable but also advised that Dr. Myer Haff would see the patient in the morning.  In the interim, Dr. Myer Haff evaluated the patient and said that he does not think the patient will need to be admitted.  He recommended keeping the systolic blood pressure below 086 with labetalol and repeating the CT scan in 6 hours.  If it is unchanged, he thinks the patient will be appropriate for discharge.  I am transferring ED care to Dr. Rosalia Hammers who is aware of the situation.  She will reassess the patient and will decide if the patient is appropriate for discharge based on his hypertension and the rest of the clinical picture in addition to the consultant recommendations. [CF]  1234 Notified by radiology that patient's repeat head CT  does demonstrate an large subdural hematoma with increased midline shift.  Neurosurgery paged. [NR]  1345 Case reviewed with Dr. Marcell Barlow. He reevaluated the patient at bedside.  Family would like to proceed with surgery.  ICU team contacted.  They will evaluate the patient for anticipated admission. [NR]    Clinical Course User Index [CF] Loleta Rose, MD [NR] Trinna Post, MD        Trinna Post, MD 06/24/22 [phone]

## 2022-06-24 NOTE — Progress Notes (Signed)
1726 Received from the PACU via bed . Report received from [facility]. Extremely sleepy. Complained of a headache but not medicated due to extreme drowsiness.

## 2022-06-24 NOTE — ED Notes (Signed)
Patient transported to CT 

## 2022-06-24 NOTE — Consult Note (Addendum)
Consult requested by:  Dr. Rosalia Hammers  Consult requested for:  Subdural hematoma  Primary Physician:  Mitchell Ayers  History of Present Illness: 06/24/2022 [name] is here today with a chief complaint of fall with identification of a subdural hematoma.  He is independent at baseline.  He is on a baby aspirin but no other anticoagulants.  He fell while going to the restroom.  He had a significant laceration of his L eyebrow.  During workup, R subdural hematoma was noted.  On repeat imaging, the subdural has gotten larger.  He now has a headache.     The symptoms are causing a significant impact on the patient's life.   I have utilized the care everywhere function in epic to review the outside records available from external health systems.  Review of Systems:  A 10 point review of systems is negative, except for the pertinent positives and negatives detailed in the HPI.  Past Medical History: Past Medical History:  Diagnosis Date   Anxiety    HLD (hyperlipidemia)    HTN (hypertension)    Hypothyroid    Normocytic anemia    RA (rheumatoid arthritis) (HCC)     Past Surgical History: Past Surgical History:  Procedure Laterality Date   CHOLECYSTECTOMY     HIP ARTHROPLASTY Right 06/13/2021   Procedure: ARTHROPLASTY BIPOLAR HIP (HEMIARTHROPLASTY);  Surgeon: Ross Marcus, MD;  Location: [facility] ORS;  Service: Orthopedics;  Laterality: Right;   Left knee surgery      Allergies: Allergies as of 06/24/2022 - Review Complete 06/24/2022  Allergen Reaction Noted   Tetracyclines & related Anaphylaxis 06/12/2021   Tamsulosin  02/07/2015    Medications: No outpatient medications have been marked as taking for the 06/24/22 encounter [facility] Encounter).    Social History: Social History   Tobacco Use   Smoking status: Former    Types: Cigarettes   Smokeless tobacco: Never  Substance Use Topics   Alcohol use: Not Currently   Drug use: Never    Family Medical  History: Family History  Problem Relation Age of Onset   Hypertension Mother    Parkinson's disease Father    Stomach cancer Father     Physical Examination: Vitals:   06/24/22 1148 06/24/22 1149  BP:    Pulse: 79   Resp: 13 (!) 23  Temp:    SpO2: 100%     General: Patient is slightly sleepy, but does answer questions  Neck:   Supple.  Full range of motion.  Respiratory: Patient is breathing without any difficulty.   NEUROLOGICAL:     Awake, alert, oriented to person, place, and "April 2024."  Speech is clear and fluent.  Cranial Nerves: OE to voice. Pupils equal round and reactive to light.  Facial tone is symmetric.  Facial sensation is symmetric. Shoulder shrug is symmetric. Tongue protrusion is midline.  There is mild L pronator drift.  Strength: Side Biceps Triceps Deltoid Interossei Grip Wrist Ext. Wrist Flex.  R 5 5 5 5 5 5 5   L 4+ 4+ 4+ 5 4+ 5 5   Side Iliopsoas Quads Hamstring PF DF EHL  R 5 5 5 5 5 5   L 4+ 4+ 5 5 5 5    Hoffman's is absent.   Bilateral upper and lower extremity sensation is intact to light touch.    No evidence of dysmetria noted.  Gait is untested.     Medical Decision Making  Imaging: CT Head 06/24/2022 IMPRESSION: 1. Enlarging sUbdural hematoma:  Increased size and extent of right hemisphere predominant SDH since 0522 hours, now up to 14 mm in thickness. New right posterior fossa and para falcine subdural blood, mildly increased dorsal clivus blood. And increased intracranial mass effect, leftward midline shift now 7 mm (previously 5 mm).   2. Trace new IVH also. No ventriculomegaly. No convincing SAH or hemorrhagic contusion.   3. No skull fracture identified.   Salient findings discussed by telephone with Dr. Trinna Post on 06/24/2022 at 12:21.     Electronically Signed   By: Odessa Fleming M.D.   On: 06/24/2022 12:22  I have personally reviewed the images and agree with the above interpretation.  Assessment and Plan: Mr.  Mitchell Ayers is a pleasant 82 y.o. male with traumatic subdural hematoma with worsening findings on repeat head CT.  He has current GCS of E3V4M6=13.  He has brain compression from this enlarging subdural hematoma with right to left midline shift of 7 mm from 5 previously.  - I have recommended emergency R craniotomy for subdural hematoma. I discussed the planned procedure at length with the patient and his wife and daughter, including the risks, benefits, alternatives, and indications. The risks discussed include but are not limited to bleeding, infection, need for reoperation, spinal fluid leak, stroke, vision loss, anesthetic complication, coma, paralysis, and even death. I also described in detail that improvement was not guaranteed.  They expressed understanding of these risks, and asked that we proceed with surgery. I described the surgery in layman's terms, and gave ample opportunity for questions, which were answered to the best of my ability.  The patient is not currently consentable, but his wife is present and able to give consent for him.  - Admit ICU post-op - Keppra 500 mg BID for 1 week   I have communicated my recommendations to the requesting physician and coordinated care to facilitate these recommendations.     Rhydian Baldi K. Myer Haff MD, [facility] Neurosurgery

## 2022-06-24 NOTE — ED Notes (Signed)
Provider Ray N. notified in person of pt's BP.

## 2022-06-24 NOTE — ED Notes (Signed)
Pt done attempting to use bedpan. Pt did not have BM. Remains on male external cath. Wife remains at bedside. Pt boosted in bed. HOB 30 degrees.

## 2022-06-24 NOTE — Telephone Encounter (Signed)
Mitchell Ayers, change of plans. His subdural hematoma increased in size and he has been taken to the OR for a craniotomy. I made 2 post op appointments for him. His CT will need to be a few days before his post op appointment with Dr Myer Haff.

## 2022-06-24 NOTE — ED Notes (Addendum)
Pt placed on bedpan as requested. Linens changed and pad placed under pt as small amount of urine noted on sheets when pt placed on bedpan. Gown changed at pt's request. About 400cc of urine noted in canister currently. Wife remains at bedside.

## 2022-06-24 NOTE — ED Triage Notes (Addendum)
Pt to ED via [facility]. Per EMS pt fell and hit his nose, pt with bleeding to his nose.   204/102 HR 68 100% RA   Pt states uses a walker at night, pt states mechanical fall due to walker slipping out from under him. 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

## 2022-06-24 NOTE — Op Note (Signed)
Indications: The patient is a 82yo male who presented with a subdural hematoma.  Due to ongoing brain compression and symptoms, surgical intervention was recommended.   Findings: subdural hematoma  Preoperative Diagnosis: subdural hematoma Postoperative Diagnosis: same   EBL: 100 ml IVF: see AR ml Drains: 1 placed Disposition: Extubated and Stable to PACU Complications: none  A foley catheter was placed.   Preoperative Note:   Risks of surgery discussed include: infection, bleeding, stroke, coma, death, paralysis, CSF leak, nerve/spinal cord injury, numbness, tingling, weakness, vascular injury, need for further surgery, persistent symptoms, and the risks of anesthesia. The patient's family understood these risks and agreed to proceed.  NAME OF PROCEDURE:               1. Right Craniotomy for evacuation of hematoma   PROCEDURE:  Patient was brought to the operating room, intubated. The mayfield pins were applied.  The patient was then positioned for a right-sided craniotomy.    The incision was planned, then prepped and draped in standard fashion.  The incision was opened sharply, then the galea opened.  A retractor was placed.  The temporalis muscle was divided, then the periosteal used to reflect the muscle.   A frontotemporoparietal craniotomy was then fashioned with the burr and craniotome.  The dura was identified, then opened sharply.  A subdural hematoma was identified.  The acute subdural was then removed using irrigation and suction.  After removal, the intradural space was inspected and hemostasis achieved.    After hemostasis was achieved, we turned attention to closure.  The dura was approximated.  Tackup sutures were placed. The craniotomy site was checked and a fixation plate used to reconstruct the skull.  The temporalis and galea were closed.  Staples were used on the skin. A sterile dressing was placed.    Needle, lap and all counts were correct at the end of the  case.    Venetia Night MD Neurosurgery  3 burr hole covers [phone]) and 1 dogbone were used.  11 total 4mm screws were used for fixation.

## 2022-06-24 NOTE — Anesthesia Procedure Notes (Signed)
Procedure Name: Intubation Date/Time: 06/24/2022 2:29 PM  Performed by: Katherine Basset, CRNAPre-anesthesia Checklist: Patient identified, Emergency Drugs available, Suction available and Patient being monitored Patient Re-evaluated:Patient Re-evaluated prior to induction Oxygen Delivery Method: Circle system utilized Preoxygenation: Pre-oxygenation with 100% oxygen Induction Type: IV induction Laryngoscope Size: McGraph and 3 Grade View: Grade I Tube type: Oral Tube size: 7.0 mm Number of attempts: 1 Airway Equipment and Method: Stylet, Oral airway, Bite block and LTA kit utilized Placement Confirmation: ETT inserted through vocal cords under direct vision, positive ETCO2 and breath sounds checked- equal and bilateral Secured at: 21 cm Tube secured with: Tape Dental Injury: Teeth and Oropharynx as per pre-operative assessment

## 2022-06-24 NOTE — ED Notes (Signed)
ICU providers at bedside currently.

## 2022-06-24 NOTE — ED Notes (Signed)
200cc urine emptied from pt's canister.

## 2022-06-24 NOTE — ED Notes (Addendum)
Pt A&Ox4 currently. Pt asks questions repetitively as if he has short-term memory issues. Speech clear. Small lac and bruising to pt's L eye. HOB about 15-30 degrees. Pt used bed pan; pt only urinated; bedpan removed. Pt boosted in bed.

## 2022-06-24 NOTE — Telephone Encounter (Signed)
-----   Message from Venetia Night, MD sent at 06/24/2022  7:03 AM EDT ----- Will need CT in ~2 weeks for reevaluation.  OK to order under me to get on July 1  [facility]

## 2022-06-24 NOTE — Telephone Encounter (Signed)
Order for Head CT has been placed. I will obtain authorization for this.

## 2022-06-24 NOTE — ED Notes (Signed)
Pt's family remains at bedside. Pt alert. Consulting provider from surgery talking with pt/family now.

## 2022-06-25 ENCOUNTER — Inpatient Hospital Stay: Payer: Medicare HMO

## 2022-06-25 ENCOUNTER — Encounter: Payer: Self-pay | Admitting: Neurosurgery

## 2022-06-25 DIAGNOSIS — S065XAA Traumatic subdural hemorrhage with loss of consciousness status unknown, initial encounter: Secondary | ICD-10-CM | POA: Diagnosis not present

## 2022-06-25 LAB — CBC WITH DIFFERENTIAL/PLATELET
Abs Immature Granulocytes: 0.15 10*3/uL — ABNORMAL HIGH (ref 0.00–0.07)
Basophils Absolute: 0 10*3/uL (ref 0.0–0.1)
Basophils Relative: 0 %
Eosinophils Absolute: 0 10*3/uL (ref 0.0–0.5)
Eosinophils Relative: 0 %
HCT: 32.9 % — ABNORMAL LOW (ref 39.0–52.0)
Hemoglobin: 10.3 g/dL — ABNORMAL LOW (ref 13.0–17.0)
Immature Granulocytes: 1 %
Lymphocytes Relative: 3 %
Lymphs Abs: 0.5 10*3/uL — ABNORMAL LOW (ref 0.7–4.0)
MCH: 31 pg (ref 26.0–34.0)
MCHC: 31.3 g/dL (ref 30.0–36.0)
MCV: 99.1 fL (ref 80.0–100.0)
Monocytes Absolute: 1 10*3/uL (ref 0.1–1.0)
Monocytes Relative: 6 %
Neutro Abs: 15.8 10*3/uL — ABNORMAL HIGH (ref 1.7–7.7)
Neutrophils Relative %: 90 %
Platelets: 243 10*3/uL (ref 150–400)
RBC: 3.32 MIL/uL — ABNORMAL LOW (ref 4.22–5.81)
RDW: 15.7 % — ABNORMAL HIGH (ref 11.5–15.5)
WBC: 17.5 10*3/uL — ABNORMAL HIGH (ref 4.0–10.5)
nRBC: 0 % (ref 0.0–0.2)

## 2022-06-25 LAB — BASIC METABOLIC PANEL
Anion gap: 8 (ref 5–15)
BUN: 24 mg/dL — ABNORMAL HIGH (ref 8–23)
CO2: 24 mmol/L (ref 22–32)
Calcium: 8.2 mg/dL — ABNORMAL LOW (ref 8.9–10.3)
Chloride: 105 mmol/L (ref 98–111)
Creatinine, Ser: 1.41 mg/dL — ABNORMAL HIGH (ref 0.61–1.24)
GFR, Estimated: 49 mL/min — ABNORMAL LOW (ref >60)
Glucose, Bld: 149 mg/dL — ABNORMAL HIGH (ref 70–99)
Potassium: 4.2 mmol/L (ref 3.5–5.1)
Sodium: 137 mmol/L (ref 135–145)

## 2022-06-25 LAB — TROPONIN I (HIGH SENSITIVITY): Troponin I (High Sensitivity): 683 ng/L (ref <18)

## 2022-06-25 MED ORDER — ENSURE ENLIVE PO LIQD
237.0000 mL | Freq: Three times a day (TID) | ORAL | Status: DC
  Administered 2022-06-25 – 2022-07-02 (×18): 237 mL via ORAL

## 2022-06-25 MED ORDER — ADULT MULTIVITAMIN W/MINERALS CH
1.0000 | ORAL_TABLET | Freq: Every day | ORAL | Status: DC
  Administered 2022-06-26 – 2022-07-02 (×7): 1 via ORAL
  Filled 2022-06-25 (×7): qty 1

## 2022-06-25 NOTE — Evaluation (Signed)
Occupational Therapy Evaluation Patient Details Name: Mitchell Ayers MRN: [id number] DOB: 10-28-1940 Today's Date: 06/25/2022   History of Present Illness Patient is a 82 year old male presenting after hitting his nose resulting in a nose bleed after mechanical fall.  Patient had enlarging subdural hematoma, s/p craniotomy for evacuation of an acute subdural hematoma.   Clinical Impression   Mitchell Ayers was seen for OT/PT co-evaluation this date. Prior to hospital admission, pt was IND. Pt lives with spouse in home c ramped entrance. Pt currently requires MIN A + RW sit<>stand improving to CGA + RW for ~100 ft mobility. Pt would benefit from skilled OT to address noted impairments and functional limitations (see below for any additional details). Upon hospital discharge, recommend OT follow up and 24/7 SUPERVISION if plan to return home.   Recommendations for follow up therapy are one component of a multi-disciplinary discharge planning process, led by the attending physician.  Recommendations may be updated based on patient status, additional functional criteria and insurance authorization.   Assistance Recommended at Discharge Frequent or constant Supervision/Assistance  Patient can return home with the following Help with stairs or ramp for entrance;A little help with bathing/dressing/bathroom    Functional Status Assessment  Patient has had a recent decline in their functional status and demonstrates the ability to make significant improvements in function in a reasonable and predictable amount of time.  Equipment Recommendations  None recommended by OT    Recommendations for Other Services       Precautions / Restrictions Precautions Precautions: Fall Precaution Comments: JP drain Restrictions Weight Bearing Restrictions: No      Mobility Bed Mobility               General bed mobility comments: not assessed as patient sitting up on arrival and post session     Transfers Overall transfer level: Needs assistance Equipment used: Rolling walker (2 wheels) Transfers: Sit to/from Stand Sit to Stand: Min assist           General transfer comment: verbal cues for hand placement for safety. increased time and effort required      Balance Overall balance assessment: Needs assistance Sitting-balance support: Feet supported Sitting balance-Leahy Scale: Fair     Standing balance support: Bilateral upper extremity supported, During functional activity Standing balance-Leahy Scale: Poor Standing balance comment: poor initially progressing to fair with increased standing time. RW used for UE support in standing                           ADL either performed or assessed with clinical judgement   ADL Overall ADL's : Needs assistance/impaired                                       General ADL Comments: MIN A + RW simulated toilet t/f      Pertinent Vitals/Pain Pain Assessment Pain Assessment: No/denies pain     Hand Dominance     Extremity/Trunk Assessment Upper Extremity Assessment Upper Extremity Assessment: Overall WFL for tasks assessed   Lower Extremity Assessment Lower Extremity Assessment: Overall WFL for tasks assessed       Communication Communication Communication: No difficulties   Cognition Arousal/Alertness: Awake/alert Behavior During Therapy: Flat affect Overall Cognitive Status: Impaired/Different from baseline Area of Impairment: Following commands, Safety/judgement, Problem solving  Following Commands: Follows one step commands with increased time Safety/Judgement: Decreased awareness of safety, Decreased awareness of deficits   Problem Solving: Slow processing, Decreased initiation, Requires verbal cues, Requires tactile cues General Comments: states he is drowsy, awakes to answer all questions, eyes closing without stimulus     General Comments   patient closes eyes intermittently and is groggy throughout session. left eye is swollen and bruised but patient reports no vision issues and no pain     Home Living Family/patient expects to be discharged to:: Private residence Living Arrangements: Spouse/significant other Available Help at Discharge: Family Type of Home: House Home Access:  (chair lift available)     Home Layout: One level     Bathroom Shower/Tub: Walk-in shower         Home Equipment: Pharmacist, hospital (2 wheels)          Prior Functioning/Environment Prior Level of Function : Independent/Modified Independent             Mobility Comments: intermittent use of rolling walker          OT Problem List: Decreased strength;Decreased range of motion;Decreased activity tolerance;Impaired balance (sitting and/or standing);Decreased cognition      OT Treatment/Interventions: Self-care/ADL training;Therapeutic exercise;Energy conservation;DME and/or AE instruction;Therapeutic activities;Patient/family education;Balance training    OT Goals(Current goals can be found in the care plan section) Acute Rehab OT Goals Patient Stated Goal: to go home OT Goal Formulation: With patient/family Time For Goal Achievement: 07/09/22 Potential to Achieve Goals: Good ADL Goals Pt Will Perform Grooming: with modified independence;standing Pt Will Perform Lower Body Dressing: with modified independence;sit to/from stand Pt Will Transfer to Toilet: with modified independence;ambulating;regular height toilet  OT Frequency: Min 3X/week    Co-evaluation PT/OT/SLP Co-Evaluation/Treatment: Yes   PT goals addressed during session: Mobility/safety with mobility OT goals addressed during session: ADL's and self-care      AM-PAC OT "6 Clicks" Daily Activity     Outcome Measure Help from another person eating meals?: None Help from another person taking care of personal grooming?: A Little Help from another  person toileting, which includes using toliet, bedpan, or urinal?: A Little Help from another person bathing (including washing, rinsing, drying)?: A Little Help from another person to put on and taking off regular upper body clothing?: None Help from another person to put on and taking off regular lower body clothing?: A Little 6 Click Score: 20   End of Session Nurse Communication: Mobility status  Activity Tolerance: Patient tolerated treatment well Patient left: in chair;with call bell/phone within reach;with family/visitor present;with nursing/sitter in room  OT Visit Diagnosis: Other abnormalities of gait and mobility (R26.89);Muscle weakness (generalized) (M62.81)                Time: 1610-9604 OT Time Calculation (min): 15 min Charges:  OT General Charges $OT Visit: 1 Visit OT Evaluation $OT Eval Moderate Complexity: 1 Mod  Kathie Dike, M.S. OTR/L  06/25/22, 1:31 PM  ascom [phone]

## 2022-06-25 NOTE — Consult Note (Signed)
PHARMACY CONSULT NOTE - FOLLOW UP  Pharmacy Consult for Electrolyte Monitoring and Replacement   Recent Labs: Potassium (mmol/L)  Date Value  06/25/2022 4.2   Magnesium (mg/dL)  Date Value  16/10/9602 1.8   Calcium (mg/dL)  Date Value  54/09/8117 8.2 (L)   Phosphorus (mg/dL)  Date Value  14/78/2956 2.6   Sodium (mmol/L)  Date Value  06/25/2022 137     Assessment: 82 yo male who presented to [facility] ER on 06/17 via EMS after hitting his nose resulting in a nosebleed following a mechanical fall. Per ER notes pt ambulates with a walker at baseline and while using the walker last night it slipped out from under him. Following the fall he also injured his left eye/eyebrow and developed upper back pain and left hip pain. Pharmacy has been consulted to monitor and replace electrolytes while under PCCM care.  Goal of Therapy:  Electrolytes WNL  Plan:  - no replacement currently indicated - will recheck electrolytes tomorrow with AM labs  Bettey Costa ,PharmD Clinical Pharmacist 06/25/2022 7:30 AM

## 2022-06-25 NOTE — TOC Initial Note (Signed)
Transition of Care [facility]) - Initial/Assessment Note    Patient Details  Name: Mitchell Ayers MRN: [id number] Date of Birth: [birth year]  Transition of Care [facility]) CM/SW Contact:    Darolyn Rua, LCSW Phone Number: 06/25/2022, 3:27 PM  Clinical Narrative:                  CSW spoke with patient's spouse and daughter Mitchell Ayers regarding home health recs, they report being hopeful of SNF and would like patient to be re assessed by PT in the next 1- 2 days, PT has been updated. They report only support at home is patient's spouse Mitchell Ayers and she herself has fallen multiple times in the past year, they believe patient exceeds the level of care she is able to provide at home.   TOC will continue to follow.   Expected Discharge Plan:  (TBD) Barriers to Discharge: Continued Medical Work up   Patient Goals and CMS Choice Patient states their goals for this hospitalization and ongoing recovery are:: to go home CMS Medicare.gov Compare Post Acute Care list provided to:: Patient Represenative (must comment) (spouse and daughter) Choice offered to / list presented to : Spouse      Expected Discharge Plan and Services       Living arrangements for the past 2 months: Single Family Home                                      Prior Living Arrangements/Services Living arrangements for the past 2 months: Single Family Home Lives with:: Spouse                   Activities of Daily Living      Permission Sought/Granted                  Emotional Assessment              Admission diagnosis:  Subdural hematoma (HCC) [S06.5XAA] Elevated troponin level [R79.89] Traumatic subdural hematoma with loss of consciousness, initial encounter [facility]) [S06.5X9A] Patient Active Problem List   Diagnosis Date Noted   Traumatic subdural hematoma with loss of consciousness (HCC) 06/24/2022   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/24/2022   Subdural hematoma (HCC) 06/24/2022   Malnutrition of  moderate degree 06/13/2021   Fracture of femoral neck, right, closed (HCC) 06/12/2021   HTN (hypertension) 06/12/2021   HLD (hyperlipidemia) 06/12/2021   Hypothyroid 06/12/2021   Anxiety 06/12/2021   Normocytic anemia 06/12/2021   RA (rheumatoid arthritis) (HCC) 06/12/2021   Fall at home, initial encounter 06/12/2021   TIA (transient ischemic attack) 06/12/2021   PCP:  Marlan Palau Pharmacy:   [facility] [address], [place] - [address] [place] [address] Phone: [phone] Fax: [phone]     Social Determinants of Health (SDOH) Social History: SDOH Screenings   Tobacco Use: Medium Risk (06/25/2022)   SDOH Interventions:     Readmission Risk Interventions     No data to display

## 2022-06-25 NOTE — Progress Notes (Addendum)
NAME:  Mitchell Ayers, MRN:  [id number], DOB:  Jan 09, 1940, LOS: 1 ADMISSION DATE:  06/24/2022, CONSULTATION DATE: 06/24/2022 REFERRING MD: Dr. Rosalia Hammers , CHIEF COMPLAINT: Fall    History of Present Illness:  This is an 82 yo male who presented to [facility] ER on 06/17 via EMS after hitting his nose resulting in a nosebleed following a mechanical fall.  Per ER notes pt ambulates with a walker at baseline and while using the walker last night it slipped out from under him.  Following the fall he also injured his left eye/eyebrow and developed upper back pain and left hip pain.  He does take aspirin at home.    ED Course  Upon arrival to the ER significant lab results were: glucose 106/troponin 18/wbc 12.6/hgb 11.4/hct 36.7.  CT Head revealed positive widespread right hemisphere subdural hematoma tracking onto the right tentorium and also inferiorly along the dorsal clivus toward the foramen magnum with associated mild intracranial mass effect 5 mm of leftward midline shift.  Neurosurgeon Dr. Katrinka Blazing consulted recommended repeating CT Head 6 hrs from initial CT to evaluate for evaluation.  Pt also hypertensive per neurosurgery recommendations recommended 20 mg labetalol and iv mannitol.  Repeat CT Head revealed enlarging subdural hematoma with right to left midline shift of 7 mm from 5 mm.  Neurosurgery recommended proceeding with emergent right craniotomy and pt/pts family consented.  PCCM team contacted for ICU admission.   Initial CT Head:  Positive for widespread right hemisphere Subdural hematoma, tracking onto the right tentorium and also inferiorly along the dorsal clivus toward the foramen magnum. SDH is up to 9 mm in thickness. Associated mild intracranial mass effect with 5 mm of leftward midline shift. No other acute traumatic injury to the brain. Left forehead scalp laceration and hematoma with No skull fracture identified. CT Cervical Spine: No acute traumatic injury identified in the cervical spine.  Hemorrhage from the ventral posterior fossa subdural hematoma likely tracks to the ventral cisterna magna and perhaps as far as C1. No other convincing cervical canal blood. 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 Emphysema (ICD10-J43.9). CT Maxillofacial: . No facial fracture identified. Medial left orbit and left forehead scalp soft tissue injury. Subdural hematoma at the central skull base and in the right hemisphere detailed separately. Repeat CT Head: Enlarging subdural hematoma: Increased size and extent of right hemisphere predominant SDH since 0522 hours, now up to 14 mm in thickness. New right posterior fossa and para falcine subdural blood, mildly increased dorsal clivus blood. And increased intracranial mass effect, leftward midline shift now 7 mm (previously 5 mm). Trace new IVH also. No ventriculomegaly. No convincing SAH or hemorrhagic contusion.No skull fracture identified.  Pertinent  Medical History  Anxiety  HLD HTN Hypothyroidism  Normocytic Anemia  Rheumatoid Arthritis   Significant Hospital Events: Including procedures, antibiotic start and stop dates in addition to other pertinent events   06/17: Pt admitted with subdural hematoma due to mechanical fall underwent emergent right craniotomy per neurosurgery  06/18: Pt POD 1 doing well repeat CT Head revealed nearly resolved midline shift after right subdural hematoma decompression and no new abnormality.  Neurosurgery ok with transitioning pt to stepdown status   Interim History / Subjective:  No acute events overnight and no new complaints this am   Objective   Blood pressure 137/76, pulse 68, temperature 97.7 F (36.5 C), resp. rate 20, height 5\' 6"  (1.676 m), weight 66.1 kg, SpO2 98 %.        Intake/Output  Summary (Last 24 hours) at 06/25/2022 0856 Last data filed at 06/25/2022 0800 Gross per 24 hour  Intake 2425 ml  Output 2520 ml  Net -95 ml   Filed Weights   06/24/22 0509 06/24/22 1726  06/25/22 0330  Weight: 66.2 kg 69.3 kg 66.1 kg   Examination: General: Acutely-ill appearing male, NAD on RA  HENT: No JVD, left periorbital ecchymosis and edema Lungs: Clear throughout, even, non labored  Cardiovascular: NSR with BBB, no m/r/g, 2+ radial/2+ distal pulses, no extremity edema  Abdomen: +BS x4, soft, slight tenderness, non distended  Extremities: Normal bulk and tone, moves all extremities  Skin: left periorbital ecchymosis and edema.  Post right craniotomy drain present see below   Neuro: Alert and oriented, following commands, no neurological deficits, PERRLA  GU: Indwelling foley catheter in place draining dark yellow urine   Resolved Hospital Problem list     Assessment & Plan:  #Subdural hematoma secondary to mechanical fall s/p right craniotomy  Hx: Normocytic anemia  - Neurosurgery consulted appreciate input: pending emergent right craniotomy  - Continue drain for now, neuro exams q4hrs, and HOA 30 degrees per neurosurgery recommendations  - Keppra 500 mg orally bid x7 days  - Trend CBC  - Monitor for s/sx of bleeding  - VTE px: SCD's; per neurosurgery can start chemical px  - PT/OT   #HTN  #Mildly elevated troponin's likely secondary to demand ischemia  Hx: HLD  - Continuous telemetry monitoring  - Repeat troponin pending  - BP goal per neurosurgery recommendations  - Continue outpatient atorvastatin and lisinopril   #Hypothyroidism  - Continue outpatient synthroid   #Rheumatoid arthritis  - Continue outpatient methotrexate   Best Practice (right click and "Reselect all SmartList Selections" daily)   Diet/type: Regular consistency  DVT prophylaxis: SCD GI prophylaxis: PPI Lines: N/A Foley: Yes will discontinue today  Code Status:  full code Last date of multidisciplinary goals of care discussion [06/25/22]  06/18: Pt updated regarding plan of care and all questions answered  Labs   CBC: Recent Labs  Lab 06/24/22 0546  WBC 12.6*   NEUTROABS 9.6*  HGB 11.4*  HCT 36.7*  MCV 99.7  PLT 258    Basic Metabolic Panel: Recent Labs  Lab 06/24/22 0546 06/25/22 0442  NA 137 137  K 4.2 4.2  CL 100 105  CO2 26 24  GLUCOSE 99 149*  BUN 22 24*  CREATININE 1.08 1.41*  CALCIUM 9.1 8.2*   GFR: Estimated Creatinine Clearance: 35.8 mL/min (A) (by C-G formula based on SCr of 1.41 mg/dL (H)). Recent Labs  Lab 06/24/22 0546  WBC 12.6*    Liver Function Tests: No results for input(s): "AST", "ALT", "ALKPHOS", "BILITOT", "PROT", "ALBUMIN" in the last 168 hours. No results for input(s): "LIPASE", "AMYLASE" in the last 168 hours. No results for input(s): "AMMONIA" in the last 168 hours.  ABG No results found for: "PHART", "PCO2ART", "PO2ART", "HCO3", "TCO2", "ACIDBASEDEF", "O2SAT"   Coagulation Profile: Recent Labs  Lab 06/24/22 0546  INR 1.0    Cardiac Enzymes: No results for input(s): "CKTOTAL", "CKMB", "CKMBINDEX", "TROPONINI" in the last 168 hours.  HbA1C: No results found for: "HGBA1C"  CBG: Recent Labs  Lab 06/24/22 1725  GLUCAP 147*    Review of Systems: Positives in BOLD   Gen: fall, left eye pain, fever, chills, weight change, fatigue, night sweats HEENT: Denies blurred vision, double vision, hearing loss, tinnitus, sinus congestion, rhinorrhea, sore throat, neck stiffness, dysphagia PULM: Denies shortness of breath, cough,  sputum production, hemoptysis, wheezing CV: Denies chest pain, edema, orthopnea, paroxysmal nocturnal dyspnea, palpitations GI: Denies abdominal pain, nausea, vomiting, diarrhea, hematochezia, melena, constipation, change in bowel habits GU: Denies dysuria, hematuria, polyuria, oliguria, urethral discharge Endocrine: Denies hot or cold intolerance, polyuria, polyphagia or appetite change Derm: Denies rash, dry skin, scaling or peeling skin change Heme: Denies easy bruising, bleeding, bleeding gums Neuro: Denies headache, numbness, weakness, slurred speech, loss of memory  or consciousness   Past Medical History:  He,  has a past medical history of Anxiety, HLD (hyperlipidemia), HTN (hypertension), Hypothyroid, Normocytic anemia, and RA (rheumatoid arthritis) (HCC).   Surgical History:   Past Surgical History:  Procedure Laterality Date   CHOLECYSTECTOMY     HIP ARTHROPLASTY Right 06/13/2021   Procedure: ARTHROPLASTY BIPOLAR HIP (HEMIARTHROPLASTY);  Surgeon: Ross Marcus, MD;  Location: [facility] ORS;  Service: Orthopedics;  Laterality: Right;   Left knee surgery       Social History:   reports that he has quit smoking. His smoking use included cigarettes. He has never used smokeless tobacco. He reports that he does not currently use alcohol. He reports that he does not use drugs.   Family History:  His family history includes Hypertension in his mother; Parkinson's disease in his father; Stomach cancer in his father.   Allergies Allergies  Allergen Reactions   Tetracyclines & Related Anaphylaxis   Tamsulosin     Other reaction(s): Headache Left sided headache     Home Medications  Prior to Admission medications   Medication Sig Start Date End Date Taking? Authorizing Provider  atorvastatin (LIPITOR) 20 MG tablet Take 20 mg by mouth daily. 06/09/21  Yes [provider]  Azelastine HCl 137 MCG/SPRAY SOLN Place 1 spray into both nostrils 2 (two) times daily. 06/12/21  Yes [provider]  clorazepate (TRANXENE) 7.5 MG tablet Take 7.5 mg by mouth 3 (three) times daily. 05/22/21  Yes [provider]  clotrimazole-betamethasone (LOTRISONE) cream Apply 1 application. topically daily. 06/11/21  Yes [provider]  diphenoxylate-atropine (LOMOTIL) 2.5-0.025 MG tablet Take 2 tablets by mouth 3 (three) times daily as needed for diarrhea or loose stools. 06/12/21  Yes [provider]  fluticasone (FLONASE) 50 MCG/ACT nasal spray Place 2 sprays into both nostrils daily. 12/27/20  Yes [provider]  folic acid  (FOLVITE) 1 MG tablet Take 2 mg by mouth daily. 10/05/20  Yes [provider]  gabapentin (NEURONTIN) 100 MG capsule Take 100 mg by mouth 2 (two) times daily. 06/10/21  Yes [provider]  levothyroxine (SYNTHROID) 100 MCG tablet Take 100 mcg by mouth daily. 06/09/21  Yes [provider]  lisinopril (ZESTRIL) 5 MG tablet Take 5 mg by mouth 2 (two) times daily. 03/26/21  Yes [provider]  methocarbamol (ROBAXIN) 500 MG tablet Take 1 tablet (500 mg total) by mouth every 8 (eight) hours as needed for muscle spasms. 06/18/21  Yes Willeen Niece, MD  methotrexate (RHEUMATREX) 2.5 MG tablet Take 12.5 mg by mouth once a week. 12/25/20  Yes [provider]  pantoprazole (PROTONIX) 40 MG tablet Take 40 mg by mouth every morning. 04/13/21  Yes [provider]  predniSONE (DELTASONE) 10 MG tablet Take by mouth. Pred pak   Yes [provider]  amoxicillin (AMOXIL) 500 MG tablet Take 2,000 mg by mouth as directed. One hour prior to Dental Treatment Patient not taking: Reported on 06/24/2022 04/18/21   [provider]  aspirin EC 81 MG tablet Take 1 tablet (81  mg total) by mouth 2 (two) times daily. Swallow whole. Patient not taking: Reported on 06/24/2022 06/18/21   Willeen Niece, MD  HYDROcodone-acetaminophen (NORCO/VICODIN) 5-325 MG tablet Take 1-2 tablets by mouth every 4 (four) hours as needed for moderate pain (pain score 4-6). Patient not taking: Reported on 06/24/2022 06/14/21   Ross Marcus, MD     Critical care time: 32 minutes      Zada Girt, AGNP  Pulmonary/Critical Care Pager [phone] (please enter 7 digits) PCCM Consult Pager [phone] (please enter 7 digits)

## 2022-06-25 NOTE — Progress Notes (Addendum)
0800 Awakens rapidly. Talkative. States he has a slight headache but needs no medication. 0900 Transferred from bed to chair. Happy he is sitting up. Ate a small amount of breakfast 1000 Wife in to see patient. Dozes frequently in chair. 1400 Back to bed with minimal assist and walker. Ate small amount of lunch. Daughter and wife concerned that patient is not eating. Explained that he was not even 24 hours post op and his appetite will return. Patient sound to sleep. Denies pain. 1530 Dr Myer Haff  notified of blood pressure.No orders received. 1600 Family worried because he is not eating. 1700 Offered water and drank 100 mls. Still very sleepy. No change in neuro assessments. Remains drowsy but awakens easily. 1800 Remains drowsy, in SR and on room air. 1815 Dr. Myer Haff in to assess patient. Updated on patient's day. No new orders received.

## 2022-06-25 NOTE — Progress Notes (Signed)
   Neurosurgery Progress Note  History: Mitchell Ayers is here for subdural hematoma.  He underwent craniotomy for subdural hematoma evacuation on June 24, 2022.  POD 1: He is doing much better.  He has minimal headache.  He is much brighter.  Physical Exam: Vitals:   06/25/22 0700 06/25/22 0800  BP: 137/76   Pulse: 68   Resp: 20   Temp: 99.5 F (37.5 C) 97.7 F (36.5 C)  SpO2: 98%     AA Ox3 (says April but knows the year) CNI No drift Strength:5/5 throughout bilateral upper extremities  Data:  Other tests/results: CT scan reviewed-expected postoperative fluid with resolution of midline shift and improvement in brain compression  Assessment/Plan:  Mitchell Ayers is substantially improved after craniotomy for evacuation of an acute subdural hematoma.  - mobilize - pain control - DVT prophylaxis okay to start - PTOT -Continue drain for now -Okay to transition to stepdown status   Venetia Night MD, [facility] Department of Neurosurgery

## 2022-06-25 NOTE — Telephone Encounter (Signed)
Noted. I will obtain authorization closer to that time.

## 2022-06-25 NOTE — Anesthesia Postprocedure Evaluation (Signed)
Anesthesia Post Note  Patient: Mitchell Ayers  Procedure(s) Performed: CRANIOTOMY HEMATOMA EVACUATION SUBDURAL (Right: Head)  Patient location during evaluation: ICU Anesthesia Type: General Level of consciousness: lethargic Pain management: pain level controlled Vital Signs Assessment: post-procedure vital signs reviewed and stable Respiratory status: spontaneous breathing Cardiovascular status: stable Postop Assessment: no apparent nausea or vomiting Anesthetic complications: no   No notable events documented.   Last Vitals:  Vitals:   06/25/22 0600 06/25/22 0700  BP: (!) 141/66 137/76  Pulse: 69 68  Resp: 20 20  Temp: 37.6 C 37.5 C  SpO2: 99% 98%    Last Pain:  Vitals:   06/25/22 0400  TempSrc: Bladder  PainSc: 0-No pain                 Eleni Frank Joanette Gula

## 2022-06-25 NOTE — Evaluation (Signed)
Physical Therapy Evaluation Patient Details Name: Eliran Guedes MRN: [id number] DOB: 1940/02/11 Today's Date: 06/25/2022  History of Present Illness  Patient is a 82 year old male presenting after hitting his nose resulting in a nose bleed after mechanical fall.  Patient had enlarging subdural hematoma, s/p craniotomy for evacuation of an acute subdural hematoma.   Clinical Impression  Patient is agreeable to PT evaluation. He was seated in the chair sleeping on arrival to room. He is groggy but able to follow single step commands with increased time. Spouse reports patient is independent with mobility at baseline and uses the rolling walker intermittently. The patient reports no other falls recently.  Patient was able to stand with Min A from recliner chair. He walked 75 ft using rolling walker with steadying assistance and chair follow for safety given grogginess. Vitals were stable throughout session with no pain or dizziness reported with mobility. Anticipate patient may require frequent supervision/assistance initially with PT recommended after this hospital stay. Patient seems eager to go home at discharge. PT will continue to follow to maximize independence and decrease caregiver burden.      Recommendations for follow up therapy are one component of a multi-disciplinary discharge planning process, led by the attending physician.  Recommendations may be updated based on patient status, additional functional criteria and insurance authorization.  Follow Up Recommendations       Assistance Recommended at Discharge Frequent or constant Supervision/Assistance  Patient can return home with the following  A little help with walking and/or transfers;A little help with bathing/dressing/bathroom;Help with stairs or ramp for entrance;Assist for transportation    Equipment Recommendations None recommended by PT  Recommendations for Other Services       Functional Status Assessment Patient has  had a recent decline in their functional status and demonstrates the ability to make significant improvements in function in a reasonable and predictable amount of time.     Precautions / Restrictions Precautions Precautions: Fall Precaution Comments: JP drain Restrictions Weight Bearing Restrictions: No      Mobility  Bed Mobility               General bed mobility comments: not assessed as patient sitting up on arrival and post session    Transfers Overall transfer level: Needs assistance Equipment used: Rolling walker (2 wheels) Transfers: Sit to/from Stand Sit to Stand: Min assist           General transfer comment: verbal cues for hand placement for safety. increased time and effort required    Ambulation/Gait Ambulation/Gait assistance: Min assist, +2 safety/equipment (+2 chair follow) Gait Distance (Feet): 75 Feet Assistive device: Rolling walker (2 wheels) Gait Pattern/deviations: Step-through pattern, Narrow base of support Gait velocity: decreased     General Gait Details: verbal cues for safety using rolling walker for support with ambulation. patient is groggy, steadying assistance required and chair follow provided for safety. vitals stable throughout session with no reported pain or dizziness with activity  Stairs            Wheelchair Mobility    Modified Rankin (Stroke Patients Only)       Balance Overall balance assessment: Needs assistance Sitting-balance support: Feet supported Sitting balance-Leahy Scale: Fair     Standing balance support: Bilateral upper extremity supported, During functional activity Standing balance-Leahy Scale: Poor Standing balance comment: poor initially progressing to fair with increased standing time. RW used for UE support in standing  Pertinent Vitals/Pain Pain Assessment Pain Assessment: No/denies pain    Home Living Family/patient expects to be discharged  to:: Private residence Living Arrangements: Spouse/significant other Available Help at Discharge: Family Type of Home: House Home Access:  (chair lift available)       Home Layout: One level Home Equipment: Pharmacist, hospital (2 wheels)      Prior Function Prior Level of Function : Independent/Modified Independent             Mobility Comments: intermittent use of rolling walker       Hand Dominance        Extremity/Trunk Assessment   Upper Extremity Assessment Upper Extremity Assessment: Overall WFL for tasks assessed    Lower Extremity Assessment Lower Extremity Assessment: Overall WFL for tasks assessed       Communication   Communication: No difficulties  Cognition Arousal/Alertness: Awake/alert Behavior During Therapy: Flat affect Overall Cognitive Status: Impaired/Different from baseline Area of Impairment: Following commands, Safety/judgement, Problem solving                       Following Commands: Follows one step commands with increased time Safety/Judgement: Decreased awareness of safety, Decreased awareness of deficits   Problem Solving: Slow processing, Decreased initiation, Requires verbal cues, Requires tactile cues          General Comments General comments (skin integrity, edema, etc.): patient closes eyes intermittently and is groggy throughout session. left eye is swollen and bruised but patient reports no vision issues and no pain    Exercises     Assessment/Plan    PT Assessment Patient needs continued PT services  PT Problem List Decreased strength;Decreased range of motion;Decreased activity tolerance;Decreased balance;Decreased mobility;Decreased safety awareness;Decreased knowledge of precautions;Decreased cognition       PT Treatment Interventions Gait training;DME instruction;Stair training;Functional mobility training;Therapeutic activities;Therapeutic exercise;Balance training;Neuromuscular  re-education;Cognitive remediation;Patient/family education    PT Goals (Current goals can be found in the Care Plan section)  Acute Rehab PT Goals Patient Stated Goal: to walk and go home PT Goal Formulation: With patient Time For Goal Achievement: 07/09/22 Potential to Achieve Goals: Fair    Frequency Min 4X/week     Co-evaluation PT/OT/SLP Co-Evaluation/Treatment: Yes (partial Co-Evaluation)   PT goals addressed during session: Mobility/safety with mobility         AM-PAC PT "6 Clicks" Mobility  Outcome Measure Help needed turning from your back to your side while in a flat bed without using bedrails?: A Little Help needed moving from lying on your back to sitting on the side of a flat bed without using bedrails?: A Little Help needed moving to and from a bed to a chair (including a wheelchair)?: A Little Help needed standing up from a chair using your arms (e.g., wheelchair or bedside chair)?: A Little Help needed to walk in hospital room?: A Little Help needed climbing 3-5 steps with a railing? : A Lot 6 Click Score: 17    End of Session   Activity Tolerance: Patient tolerated treatment well Patient left: in chair;with call bell/phone within reach;with chair alarm set;with family/visitor present Nurse Communication: Mobility status PT Visit Diagnosis: Other abnormalities of gait and mobility (R26.89);History of falling (Z91.81)    Time: 4098-1191 PT Time Calculation (min) (ACUTE ONLY): 17 min   Charges:   PT Evaluation $PT Eval Moderate Complexity: 1 Mod PT Treatments $Therapeutic Activity: 8-22 mins        Donna Bernard, PT, MPT  [facility] 06/25/2022,  12:00 PM

## 2022-06-26 ENCOUNTER — Encounter: Payer: Self-pay | Admitting: Neurosurgery

## 2022-06-26 DIAGNOSIS — S065XAA Traumatic subdural hemorrhage with loss of consciousness status unknown, initial encounter: Secondary | ICD-10-CM | POA: Diagnosis not present

## 2022-06-26 LAB — BASIC METABOLIC PANEL
Anion gap: 6 (ref 5–15)
BUN: 27 mg/dL — ABNORMAL HIGH (ref 8–23)
CO2: 26 mmol/L (ref 22–32)
Calcium: 8.3 mg/dL — ABNORMAL LOW (ref 8.9–10.3)
Chloride: 107 mmol/L (ref 98–111)
Creatinine, Ser: 1.04 mg/dL (ref 0.61–1.24)
GFR, Estimated: >60 mL/min (ref >60)
Glucose, Bld: 126 mg/dL — ABNORMAL HIGH (ref 70–99)
Potassium: 4 mmol/L (ref 3.5–5.1)
Sodium: 139 mmol/L (ref 135–145)

## 2022-06-26 LAB — TROPONIN I (HIGH SENSITIVITY): Troponin I (High Sensitivity): 691 ng/L (ref <18)

## 2022-06-26 MED ORDER — MELATONIN 5 MG PO TABS
5.0000 mg | ORAL_TABLET | Freq: Every evening | ORAL | Status: AC | PRN
  Administered 2022-06-26 – 2022-06-27 (×3): 5 mg via ORAL
  Filled 2022-06-26 (×3): qty 1

## 2022-06-26 MED ORDER — ENOXAPARIN SODIUM 40 MG/0.4ML IJ SOSY
40.0000 mg | PREFILLED_SYRINGE | Freq: Every day | INTRAMUSCULAR | Status: DC
  Administered 2022-06-26 – 2022-07-02 (×7): 40 mg via SUBCUTANEOUS
  Filled 2022-06-26 (×7): qty 0.4

## 2022-06-26 NOTE — Progress Notes (Signed)
Occupational Therapy Treatment Patient Details Name: Mitchell Ayers MRN: [id number] DOB: 1940/05/27 Today's Date: 06/26/2022   History of present illness Patient is a 82 year old male presenting after hitting his nose resulting in a nose bleed after mechanical fall.  Patient had enlarging subdural hematoma, s/p craniotomy for evacuation of an acute subdural hematoma.   OT comments  Chart reviewed, nurse cleared pt for participation in OT tx session. Pt is lethargic, reports he is tired from earlier PT session. Tx session targeted improving functional activity tolerance and ADL performance. Progress is being made towards goals set fourth, however pt is performing below baseline. Recommend continued post acute OT to address functional deficits to improve ADL status.    Recommendations for follow up therapy are one component of a multi-disciplinary discharge planning process, led by the attending physician.  Recommendations may be updated based on patient status, additional functional criteria and insurance authorization.    Assistance Recommended at Discharge Frequent or constant Supervision/Assistance  Patient can return home with the following  Help with stairs or ramp for entrance;A little help with bathing/dressing/bathroom   Equipment Recommendations  BSC/3in1    Recommendations for Other Services      Precautions / Restrictions Precautions Precautions: Fall Restrictions Weight Bearing Restrictions: No       Mobility Bed Mobility Overal bed mobility: Needs Assistance Bed Mobility: Supine to Sit, Sit to Supine     Supine to sit: Min guard Sit to supine: Min assist   General bed mobility comments: frequent vcs, asssit with BLE back into bed    Transfers Overall transfer level: Needs assistance Equipment used: Rolling walker (2 wheels) Transfers: Sit to/from Stand (multiple attempts with CGA, frequent vcs) Sit to Stand: Min guard           General transfer comment:  three lateral steps up the bed with pt demoing poor sequencing to task at hand, three lateral scoots up the bed to the L with CGA     Balance Overall balance assessment: Needs assistance Sitting-balance support: Feet supported Sitting balance-Leahy Scale: Fair     Standing balance support: Bilateral upper extremity supported, During functional activity, Reliant on assistive device for balance Standing balance-Leahy Scale: Fair                             ADL either performed or assessed with clinical judgement   ADL Overall ADL's : Needs assistance/impaired     Grooming: Wash/dry face;Supervision/safety;Sitting                   Toilet Transfer: Psychologist, sport and exercise and Hygiene: Min guard              Extremity/Trunk Psychiatrist Arousal/Alertness: Awake/alert Behavior During Therapy: Flat affect Overall Cognitive Status: Impaired/Different from baseline Area of Impairment: Attention, Memory, Following commands, Safety/judgement, Awareness, Problem solving                   Current Attention Level: Sustained Memory: Decreased short-term memory Following Commands: Follows one step commands with increased time Safety/Judgement: Decreased awareness of safety, Decreased awareness of deficits Awareness: Emergent Problem Solving: Slow processing, Decreased initiation, Requires verbal cues, Requires tactile cues  Exercises      Shoulder Instructions       General Comments lethargic throughout session, vss throughout, reports he feels tired from PT    Pertinent Vitals/ Pain       Pain Assessment Pain Assessment: No/denies pain  Home Living                                          Prior Functioning/Environment              Frequency  Min 3X/week        Progress Toward Goals  OT  Goals(current goals can now be found in the care plan section)  Progress towards OT goals: Progressing toward goals     Plan Discharge plan needs to be updated    Co-evaluation                 AM-PAC OT "6 Clicks" Daily Activity     Outcome Measure   Help from another person eating meals?: A Little Help from another person taking care of personal grooming?: A Little Help from another person toileting, which includes using toliet, bedpan, or urinal?: A Lot Help from another person bathing (including washing, rinsing, drying)?: A Lot Help from another person to put on and taking off regular upper body clothing?: A Little Help from another person to put on and taking off regular lower body clothing?: A Lot 6 Click Score: 15    End of Session Equipment Utilized During Treatment: Rolling walker (2 wheels)  OT Visit Diagnosis: Other abnormalities of gait and mobility (R26.89);Muscle weakness (generalized) (M62.81)   Activity Tolerance Patient limited by lethargy   Patient Left with call bell/phone within reach;with nursing/sitter in room;in bed   Nurse Communication Mobility status        Time: 1610-9604 OT Time Calculation (min): 11 min  Charges: OT General Charges $OT Visit: 1 Visit OT Treatments $Self Care/Home Management : 8-22 mins  Oleta Mouse, OTD OTR/L  06/26/22, 3:59 PM

## 2022-06-26 NOTE — [place] FL2 (Signed)
[place] MEDICAID FL2 LEVEL OF CARE FORM     IDENTIFICATION  Patient Name: Mitchell Ayers Birthdate: Oct 16, 1940 Sex: male Admission Date (Current Location): 06/24/2022  [facility] and [place] Number:  Chiropodist and Address:  [facility], [address], [place], [place] [address]      Provider Number: [id number]  Attending Physician Name and Address:  Sunnie Nielsen, DO  Relative Name and Phone Number:       Current Level of Care: Hospital Recommended Level of Care: Skilled Nursing Facility Prior Approval Number:    Date Approved/Denied:   PASRR Number: [id number]  Discharge Plan: SNF    Current Diagnoses: Patient Active Problem List   Diagnosis Date Noted   Traumatic subdural hematoma with loss of consciousness (HCC) 06/24/2022   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/24/2022   Subdural hematoma (HCC) 06/24/2022   Malnutrition of moderate degree 06/13/2021   Fracture of femoral neck, right, closed (HCC) 06/12/2021   HTN (hypertension) 06/12/2021   HLD (hyperlipidemia) 06/12/2021   Hypothyroid 06/12/2021   Anxiety 06/12/2021   Normocytic anemia 06/12/2021   RA (rheumatoid arthritis) (HCC) 06/12/2021   Fall at home, initial encounter 06/12/2021   TIA (transient ischemic attack) 06/12/2021    Orientation RESPIRATION BLADDER Height & Weight     Self, Situation, Place  Normal Continent, Indwelling catheter Weight: 143 lb 1.3 oz (64.9 kg) Height:  5\' 6"  (167.6 cm)  BEHAVIORAL SYMPTOMS/MOOD NEUROLOGICAL BOWEL NUTRITION STATUS   (None)  (None) Continent Diet (2 gram sodium)  AMBULATORY STATUS COMMUNICATION OF NEEDS Skin   Limited Assist Verbally Skin abrasions, Bruising, Surgical wounds (Incision on right side of head: Tefla.)                       Personal Care Assistance Level of Assistance  Bathing, Feeding, Dressing Bathing Assistance: Limited assistance Feeding assistance: Limited assistance Dressing Assistance: Limited  assistance     Functional Limitations Info  Sight, Hearing, Speech Sight Info: Adequate Hearing Info: Adequate Speech Info: Adequate    SPECIAL CARE FACTORS FREQUENCY  PT (By licensed PT), OT (By licensed OT)     PT Frequency: 5 x week OT Frequency: 5 x week            Contractures Contractures Info: Not present    Additional Factors Info  Code Status, Allergies Code Status Info: Full code Allergies Info: Tetracyclines and related, Tamsulosin           Current Medications (06/26/2022):  This is the current hospital active medication list Current Facility-Administered Medications  Medication Dose Route Frequency Provider Last Rate Last Admin   atorvastatin (LIPITOR) tablet 20 mg  20 mg Oral Daily Venetia Night, MD   20 mg at 06/26/22 0836   azelastine (ASTELIN) 0.1 % nasal spray 1 spray  1 spray Each Nare BID Venetia Night, MD   1 spray at 06/26/22 5621   Chlorhexidine Gluconate Cloth 2 % PADS 6 each  6 each Topical Daily Mannam, Praveen, MD   6 each at 06/24/22 1726   clorazepate (TRANXENE) tablet 7.5 mg  7.5 mg Oral TID Venetia Night, MD   7.5 mg at 06/26/22 0834   diphenoxylate-atropine (LOMOTIL) 2.5-0.025 MG per tablet 2 tablet  2 tablet Oral TID PRN Venetia Night, MD       docusate sodium (COLACE) capsule 100 mg  100 mg Oral BID PRN Venetia Night, MD       enoxaparin (LOVENOX) injection 40 mg  40  mg Subcutaneous Daily Venetia Night, MD       feeding supplement (ENSURE ENLIVE / ENSURE PLUS) liquid 237 mL  237 mL Oral TID BM Mannam, Praveen, MD   237 mL at 06/26/22 0838   fluticasone (FLONASE) 50 MCG/ACT nasal spray 2 spray  2 spray Each Nare Daily PRN Venetia Night, MD       folic acid (FOLVITE) tablet 2 mg  2 mg Oral Daily Venetia Night, MD   2 mg at 06/26/22 0836   gabapentin (NEURONTIN) capsule 100 mg  100 mg Oral BID Venetia Night, MD   100 mg at 06/26/22 0836   levETIRAcetam (KEPPRA) tablet 500 mg  500 mg Oral BID  Venetia Night, MD   500 mg at 06/26/22 1610   levothyroxine (SYNTHROID) tablet 100 mcg  100 mcg Oral Daily Venetia Night, MD   100 mcg at 06/26/22 0512   lisinopril (ZESTRIL) tablet 5 mg  5 mg Oral BID Venetia Night, MD   5 mg at 06/26/22 9604   melatonin tablet 5 mg  5 mg Oral QHS PRN Migdalia Dk, MD   5 mg at 06/26/22 0314   methocarbamol (ROBAXIN) tablet 500 mg  500 mg Oral Q8H PRN Venetia Night, MD       Melene Muller ON 06/30/2022] methotrexate (RHEUMATREX) tablet 12.5 mg  12.5 mg Oral Weekly Venetia Night, MD       multivitamin with minerals tablet 1 tablet  1 tablet Oral Daily Mannam, Praveen, MD   1 tablet at 06/26/22 0835   Oral care mouth rinse  15 mL Mouth Rinse PRN Mannam, Praveen, MD       pantoprazole (PROTONIX) EC tablet 40 mg  40 mg Oral q morning Venetia Night, MD   40 mg at 06/26/22 0836   polyethylene glycol (MIRALAX / GLYCOLAX) packet 17 g  17 g Oral Daily PRN Venetia Night, MD         Discharge Medications: Please see discharge summary for a list of discharge medications.  Relevant Imaging Results:  Relevant Lab Results:   Additional Information SS#: [id number]  Margarito Liner, LCSW

## 2022-06-26 NOTE — Progress Notes (Signed)
Physical Therapy Treatment Patient Details Name: Mitchell Ayers MRN: [id number] DOB: 12-27-1940 Today's Date: 06/26/2022   History of Present Illness Patient is a 82 year old male presenting after hitting his nose resulting in a nose bleed after mechanical fall.  Patient had enlarging subdural hematoma, s/p craniotomy for evacuation of an acute subdural hematoma.    PT Comments    Patient received in bed, he is agreeable to PT session. Does not recall walking yesterday. He is min guard for bed mobility with cues for safety. Min guard/assist for sit to stand. Min guard for ambulation 100 feet with RW and chair follow for safety/equipment. Patient demonstrates good tolerance for increased mobility. Decreased assistance needed this session. He will continue to benefit from skilled PT to improve safety and strength.      Recommendations for follow up therapy are one component of a multi-disciplinary discharge planning process, led by the attending physician.  Recommendations may be updated based on patient status, additional functional criteria and insurance authorization.  Follow Up Recommendations  Can patient physically be transported by private vehicle: No    Assistance Recommended at Discharge Frequent or constant Supervision/Assistance  Patient can return home with the following A little help with walking and/or transfers;A little help with bathing/dressing/bathroom;Help with stairs or ramp for entrance;Assist for transportation;Direct supervision/assist for medications management   Equipment Recommendations  Rolling walker (2 wheels)    Recommendations for Other Services       Precautions / Restrictions Precautions Precautions: Fall Restrictions Weight Bearing Restrictions: No     Mobility  Bed Mobility Overal bed mobility: Needs Assistance Bed Mobility: Supine to Sit     Supine to sit: Min guard          Transfers Overall transfer level: Needs assistance Equipment  used: Rolling walker (2 wheels) Transfers: Sit to/from Stand Sit to Stand: Min guard                Ambulation/Gait Ambulation/Gait assistance: Min guard, +2 safety/equipment Gait Distance (Feet): 100 Feet Assistive device: Rolling walker (2 wheels) Gait Pattern/deviations: Step-through pattern, Decreased step length - right, Decreased step length - left, Trunk flexed Gait velocity: decreased     General Gait Details: improved balance this session and increased distance. Min guard assist with RW and chair follow for safety.   Stairs             Wheelchair Mobility    Modified Rankin (Stroke Patients Only)       Balance Overall balance assessment: Needs assistance Sitting-balance support: Feet supported Sitting balance-Leahy Scale: Fair Sitting balance - Comments: forward leaning in sitting   Standing balance support: Bilateral upper extremity supported, During functional activity, Reliant on assistive device for balance Standing balance-Leahy Scale: Fair                              Cognition Arousal/Alertness: Awake/alert Behavior During Therapy: Flat affect Overall Cognitive Status: Impaired/Different from baseline Area of Impairment: Memory, Awareness, Problem solving                     Memory: Decreased short-term memory Following Commands: Follows one step commands with increased time Safety/Judgement: Decreased awareness of safety, Decreased awareness of deficits Awareness: Emergent Problem Solving: Slow processing, Decreased initiation, Requires verbal cues, Requires tactile cues General Comments: has no recall of walking yesterday        Exercises Total Joint Exercises Ankle Circles/Pumps: AROM, Both  General Comments        Pertinent Vitals/Pain Pain Assessment Pain Assessment: Faces Faces Pain Scale: Hurts a little bit Pain Location: head Pain Descriptors / Indicators: Headache Pain Intervention(s):  Monitored during session, Repositioned    Home Living                          Prior Function            PT Goals (current goals can now be found in the care plan section) Acute Rehab PT Goals Patient Stated Goal: to return home, family wants SNF PT Goal Formulation: With patient/family Time For Goal Achievement: 07/09/22 Potential to Achieve Goals: Fair Progress towards PT goals: Progressing toward goals    Frequency    Min 3X/week      PT Plan Discharge plan needs to be updated;Frequency needs to be updated    Co-evaluation              AM-PAC PT "6 Clicks" Mobility   Outcome Measure  Help needed turning from your back to your side while in a flat bed without using bedrails?: A Little Help needed moving from lying on your back to sitting on the side of a flat bed without using bedrails?: A Little Help needed moving to and from a bed to a chair (including a wheelchair)?: A Little Help needed standing up from a chair using your arms (e.g., wheelchair or bedside chair)?: A Little Help needed to walk in hospital room?: A Little Help needed climbing 3-5 steps with a railing? : A Lot 6 Click Score: 17    End of Session Equipment Utilized During Treatment: Gait belt Activity Tolerance: Patient tolerated treatment well Patient left: in chair;with chair alarm set;with family/visitor present;with call bell/phone within reach Nurse Communication: Mobility status PT Visit Diagnosis: Other abnormalities of gait and mobility (R26.89);History of falling (Z91.81);Muscle weakness (generalized) (M62.81);Difficulty in walking, not elsewhere classified (R26.2)     Time: 1610-9604 PT Time Calculation (min) (ACUTE ONLY): 24 min  Charges:  $Gait Training: 23-37 mins                     [employer], PT, GCS 06/26/22,11:12 AM

## 2022-06-26 NOTE — Hospital Course (Addendum)
82 yo male with hypertension, hyperlipidemia, hypothyroidism, rheumatoid arthritis who presented to [facility] ER on 06/17 via EMS after hitting his nose resulting in a nosebleed following a mechanical fall. Pt ambulates with a walker at baseline and it slipped out from under him.  06/17: admitted for subdural hematoma.  He underwent craniotomy for evacuation.  06/18: doing well post-op, remains in ICU for close monitoring. Participating w/ PT/OT. Repeat CT Head revealed nearly resolved midline shift after right subdural hematoma decompression and no new abnormality.  Neurosurgery ok with transitioning pt to stepdown status  06/19: transfer to hospitalist service. Tropes remain elevated, will get Echo but hold cardiology consult unless chest pain / abnormal Echo.   Consultants:  Neurosurgery   Procedures: 06/24/22 R craniotomy for evacuation subdural hematoma       ASSESSMENT & PLAN:   Principal Problem:   Subdural hematoma (HCC) Active Problems:   Traumatic subdural hematoma with loss of consciousness (HCC)   Compression of brain (HCC)   Subdural hematoma secondary to mechanical fall s/p right craniotomy  Hx: Normocytic anemia  Continue drain for now, neuro exams q4hrs, and HOA 30 degrees per neurosurgery recommendations  Keppra 500 mg orally bid x7 days  Trend CBC  monitor for s/sx of bleeding  VTE px: SCD's; per neurosurgery can start chemical ppx as of 06/18 PT/OT - if ambulating some, can leave w/ SCD   Elevated troponin's likely secondary to demand ischemia but trending up then stable in high 600s  troponin increased 18 --> 61 --> 683 yesterday 06/18 --> 691 this morning 06/19.  Curb-sided w/ cardiology [facility], agree not good anticoag candidate, ok to get echo, other workup pending results/symptoms    HTN  Hx: HLD  Continuous telemetry monitoring  BP goal per neurosurgery recommendations  Continue outpatient atorvastatin and lisinopril    Hypothyroidism  Continue outpatient  synthroid    Rheumatoid arthritis  Continue outpatient methotrexate    DVT prophylaxis: SCD Pertinent IV fluids/nutrition: no continuous IV fluids  Central lines / invasive devices: cranial drain   Code Status: FULL CODE  ACP documentation reviewed: 06/26/22 none on file   Current Admission Status: inpatietn   TOC needs / Dispo plan: SNF placement  Barriers to discharge / significant pending items: SNF, drain in place, neurosurgery clearance prior to discharge

## 2022-06-26 NOTE — Telephone Encounter (Signed)
Patient is still admitted.

## 2022-06-26 NOTE — Progress Notes (Signed)
   Neurosurgery Progress Note  History: Mitchell Ayers is here for subdural hematoma.  He underwent craniotomy for subdural hematoma evacuation on June 24, 2022.  POD2: Complains of intermittent headache but overall doing well  POD 1: He is doing much better.  He has minimal headache.  He is much brighter.  Physical Exam: Vitals:   06/26/22 1130 06/26/22 1200  BP:  (!) 102/59  Pulse: 69 90  Resp: (!) 27 (!) 22  Temp: 99.7 F (37.6 C) 99.3 F (37.4 C)  SpO2: 100% 98%    AA Ox3 (says May but knows the year)  CNI No drift Strength:5/5 throughout bilateral upper extremities JP output 60 overnight. Appears to be majority CSF  Data:  Other tests/results: CT scan reviewed-expected postoperative fluid with resolution of midline shift and improvement in brain compression  Assessment/Plan:  Mitchell Ayers is substantially improved after craniotomy for evacuation of an acute subdural hematoma.  - mobilize - pain control - DVT prophylaxis okay to start - PTOT -drain removed 6/19  - dressing removed. Ok to leave incision open to air -Okay to transition to stepdown status  Manning Charity PA-C Department of Neurosurgery

## 2022-06-26 NOTE — Progress Notes (Signed)
eLink Physician-Brief Progress Note Patient Name: Mitchell Ayers DOB: 08-29-1940 MRN: [id number]   Date of Service  06/26/2022  HPI/Events of Note  Patient requesting a sleep aid.  eICU Interventions  Melatonin 3 mg po Q HS PRN insomnia x 2 doses.        Thomasene Lot Krystall Kruckenberg 06/26/2022, 1:34 AM

## 2022-06-26 NOTE — Progress Notes (Signed)
PROGRESS NOTE    Mitchell Ayers   ZOX:[id number] DOB: 12-17-1940  DOA: 06/24/2022 Date of Service: 06/26/22 PCP: Marlan Palau     Brief Narrative / Hospital Course:  82 yo male with hypertension, hyperlipidemia, hypothyroidism, rheumatoid arthritis who presented to [facility] ER on 06/17 via EMS after hitting his nose resulting in a nosebleed following a mechanical fall. Pt ambulates with a walker at baseline and it slipped out from under him.  06/17: admitted for subdural hematoma.  He underwent craniotomy for evacuation.  06/18: doing well post-op, remains in ICU for close monitoring. Participating w/ PT/OT. Repeat CT Head revealed nearly resolved midline shift after right subdural hematoma decompression and no new abnormality.  Neurosurgery ok with transitioning pt to stepdown status  06/19: transfer to hospitalist service. Tropes remain elevated, will get Echo but hold cardiology consult unless chest pain / abnormal Echo.   Consultants:  Neurosurgery   Procedures: 06/24/22 R craniotomy for evacuation subdural hematoma       ASSESSMENT & PLAN:   Principal Problem:   Subdural hematoma (HCC) Active Problems:   Traumatic subdural hematoma with loss of consciousness (HCC)   Compression of brain (HCC)   Subdural hematoma secondary to mechanical fall s/p right craniotomy  Hx: Normocytic anemia  Continue drain for now, neuro exams q4hrs, and HOA 30 degrees per neurosurgery recommendations  Keppra 500 mg orally bid x7 days  Trend CBC  monitor for s/sx of bleeding  VTE px: SCD's; per neurosurgery can start chemical ppx as of 06/18 PT/OT - if ambulating some, can leave w/ SCD   Elevated troponin's likely secondary to demand ischemia but trending up then stable in high 600s  troponin increased 18 --> 61 --> 683 yesterday 06/18 --> 691 this morning 06/19.  Curb-sided w/ cardiology [facility], agree not good anticoag candidate, ok to get echo, other workup pending results/symptoms    HTN   Hx: HLD  Continuous telemetry monitoring  BP goal per neurosurgery recommendations  Continue outpatient atorvastatin and lisinopril    Hypothyroidism  Continue outpatient synthroid    Rheumatoid arthritis  Continue outpatient methotrexate    DVT prophylaxis: SCD Pertinent IV fluids/nutrition: no continuous IV fluids  Central lines / invasive devices: cranial drain   Code Status: FULL CODE  ACP documentation reviewed: 06/26/22 none on file   Current Admission Status: inpatietn   TOC needs / Dispo plan: SNF placement  Barriers to discharge / significant pending items: SNF, drain in place, neurosurgery clearance prior to discharge              Subjective / Brief ROS:  Patient reports feeling okay today Head hurts but nothing else bothering him Denies CP/SOB.  Pain controlled overall Denies new weakness.  Tolerating diet.  Reports no concerns w/ urination/defecation.   Family Communication: none at this time    Objective Findings:  Vitals:   06/26/22 1030 06/26/22 1100 06/26/22 1130 06/26/22 1200  BP:  (!) 106/59  (!) 102/59  Pulse: 100 75 69 90  Resp: (!) 25 (!) 22 (!) 27 (!) 22  Temp: 99.5 F (37.5 C) 99.7 F (37.6 C) 99.7 F (37.6 C) 99.3 F (37.4 C)  TempSrc:    Bladder  SpO2: (!) 89% 100% 100% 98%  Weight:      Height:        Intake/Output Summary (Last 24 hours) at 06/26/2022 1410 Last data filed at 06/26/2022 0700 Gross per 24 hour  Intake --  Output 670 ml  Net -670 ml  Filed Weights   06/24/22 1726 06/25/22 0330 06/26/22 0300  Weight: 69.3 kg 66.1 kg 64.9 kg    Examination:  Physical Exam Constitutional:      General: He is not in acute distress. Cardiovascular:     Rate and Rhythm: Normal rate and regular rhythm.  Pulmonary:     Effort: Pulmonary effort is normal. No respiratory distress.     Breath sounds: Normal breath sounds.  Abdominal:     General: Abdomen is flat. Bowel sounds are normal.  Musculoskeletal:     Right  lower leg: No edema.     Left lower leg: No edema.  Skin:    General: Skin is warm and dry.  Neurological:     Mental Status: He is alert and oriented to person, place, and time.  Psychiatric:        Mood and Affect: Mood normal.        Behavior: Behavior normal.          Scheduled Medications:   atorvastatin  20 mg Oral Daily   azelastine  1 spray Each Nare BID   Chlorhexidine Gluconate Cloth  6 each Topical Daily   clorazepate  7.5 mg Oral TID   enoxaparin (LOVENOX) injection  40 mg Subcutaneous Daily   feeding supplement  237 mL Oral TID BM   folic acid  2 mg Oral Daily   gabapentin  100 mg Oral BID   levETIRAcetam  500 mg Oral BID   levothyroxine  100 mcg Oral Daily   lisinopril  5 mg Oral BID   [START ON 06/30/2022] methotrexate  12.5 mg Oral Weekly   multivitamin with minerals  1 tablet Oral Daily   pantoprazole  40 mg Oral q morning    Continuous Infusions:   PRN Medications:  diphenoxylate-atropine, docusate sodium, fluticasone, melatonin, methocarbamol, mouth rinse, polyethylene glycol  Antimicrobials from admission:  Anti-infectives (From admission, onward)    None           Data Reviewed:  I have personally reviewed the following...  CBC: Recent Labs  Lab 06/24/22 0546 06/25/22 0442  WBC 12.6* 17.5*  NEUTROABS 9.6* 15.8*  HGB 11.4* 10.3*  HCT 36.7* 32.9*  MCV 99.7 99.1  PLT 258 243   Basic Metabolic Panel: Recent Labs  Lab 06/24/22 0546 06/25/22 0442 06/26/22 0411  NA 137 137 139  K 4.2 4.2 4.0  CL 100 105 107  CO2 26 24 26   GLUCOSE 99 149* 126*  BUN 22 24* 27*  CREATININE 1.08 1.41* 1.04  CALCIUM 9.1 8.2* 8.3*   GFR: Estimated Creatinine Clearance: 48.6 mL/min (by C-G formula based on SCr of 1.04 mg/dL). Liver Function Tests: No results for input(s): "AST", "ALT", "ALKPHOS", "BILITOT", "PROT", "ALBUMIN" in the last 168 hours. No results for input(s): "LIPASE", "AMYLASE" in the last 168 hours. No results for input(s):  "AMMONIA" in the last 168 hours. Coagulation Profile: Recent Labs  Lab 06/24/22 0546  INR 1.0   Cardiac Enzymes: No results for input(s): "CKTOTAL", "CKMB", "CKMBINDEX", "TROPONINI" in the last 168 hours. BNP (last 3 results) No results for input(s): "PROBNP" in the last 8760 hours. HbA1C: No results for input(s): "HGBA1C" in the last 72 hours. CBG: Recent Labs  Lab 06/24/22 1725  GLUCAP 147*   Lipid Profile: No results for input(s): "CHOL", "HDL", "LDLCALC", "TRIG", "CHOLHDL", "LDLDIRECT" in the last 72 hours. Thyroid Function Tests: No results for input(s): "TSH", "T4TOTAL", "FREET4", "T3FREE", "THYROIDAB" in the last 72 hours. Anemia Panel: No results  for input(s): "VITAMINB12", "FOLATE", "FERRITIN", "TIBC", "IRON", "RETICCTPCT" in the last 72 hours. Most Recent Urinalysis On File:  No results found for: "COLORURINE", "APPEARANCEUR", "LABSPEC", "PHURINE", "GLUCOSEU", "HGBUR", "BILIRUBINUR", "KETONESUR", "PROTEINUR", "UROBILINOGEN", "NITRITE", "LEUKOCYTESUR" Sepsis Labs: @LABRCNTIP (procalcitonin:4,lacticidven:4) Microbiology: Recent Results (from the past 240 hour(s))  MRSA Next Gen by PCR, Nasal     Status: None   Collection Time: 06/24/22  7:30 PM   Specimen: Nasal Mucosa; Nasal Swab  Result Value Ref Range Status   MRSA by PCR Next Gen NOT DETECTED NOT DETECTED Final    Comment: (NOTE) The GeneXpert MRSA Assay (FDA approved for NASAL specimens only), is one component of a comprehensive MRSA colonization surveillance program. It is not intended to diagnose MRSA infection nor to guide or monitor treatment for MRSA infections. Test performance is not FDA approved in patients less than 6 years old. Performed at [facility], [address] [place], [place] [address]       Radiology Studies last 3 days: CT HEAD WO CONTRAST ( )  Result Date: 06/25/2022 CLINICAL DATA:  Minor head trauma, postop. EXAM: CT HEAD WITHOUT CONTRAST TECHNIQUE: Contiguous axial  images were obtained from the base of the skull through the vertex without intravenous contrast. RADIATION DOSE REDUCTION: This exam was performed according to the departmental dose-optimization program which includes automated exposure control, adjustment of the mA and/or kV according to patient size and/or use of iterative reconstruction technique. COMPARISON:  Yesterday FINDINGS: Brain: Evacuation of subdural hemorrhage on the right with nearly resolved midline shift. Beneath the bone flap subdural collection and gas measures up to 1 cm in thickness on coronal reformats. Clot along the right tentorium and interhemispheric fissure is unchanged, up to 1 cm in thickness below the right occipital lobe. No brain edema, infarct, or hydrocephalus. Expected postoperative gas. Vascular: No acute finding Skull: Unremarkable craniotomy. Sinuses/Orbits: Negative IMPRESSION: Nearly resolved midline shift after right subdural hematoma decompression. No new abnormality. Electronically Signed   By: Tiburcio Pea M.D.   On: 06/25/2022 05:29   CT Head Wo Contrast  Result Date: 06/24/2022 CLINICAL DATA:  82 year old male status post fall with subdural hematoma. EXAM: CT HEAD WITHOUT CONTRAST TECHNIQUE: Contiguous axial images were obtained from the base of the skull through the vertex without intravenous contrast. RADIATION DOSE REDUCTION: This exam was performed according to the departmental dose-optimization program which includes automated exposure control, adjustment of the mA and/or kV according to patient size and/or use of iterative reconstruction technique. COMPARISON:  Head CT 0522 hours today. FINDINGS: Brain: Mildly increased conspicuity of hyperdense subdural blood along the dorsal clivus (series 5, image 27). Mixed density but mostly hyperdense right hemisphere SDH has progressed and is now uniformly greater than 5 mm, and up to 14 mm in thickness now (previous maximum 9 mm). And there is new para falcine and  right lateral posterior fossa subdural blood since this morning (coronal image 54). Basilar cisterns remain patent. No tonsillar herniation. But increasing mass effect on the right hemisphere with leftward midline shift now 7 mm (previously 5 mm). Effaced right lateral ventricle. No trapping of the left lateral ventricle. But trace new IVH layering in the left occipital horn. No convincing subarachnoid hemorrhage. No hemorrhagic contusion identified. Stable gray-white matter differentiation throughout the brain. No cortically based acute infarct identified. Vascular: Mildly increased conspicuity of hyperdense subdural blood along the dorsal clivus (series 5, image 27). Mixed density but mostly hyperdense right hemisphere SDH has progressed and is now uniformly greater than 5 mm, and up  to 13 mm in thickness now (previous maximum 9 mm) Skull: No fracture identified. Sinuses/Orbits: Visualized paranasal sinuses and mastoids are stable and well aerated. Other: Stable left periorbital scalp soft tissue injury. Globes and intraorbital soft tissues remain normal. IMPRESSION: 1. Enlarging sUbdural hematoma: Increased size and extent of right hemisphere predominant SDH since 0522 hours, now up to 14 mm in thickness. New right posterior fossa and para falcine subdural blood, mildly increased dorsal clivus blood. And increased intracranial mass effect, leftward midline shift now 7 mm (previously 5 mm). 2. Trace new IVH also. No ventriculomegaly. No convincing SAH or hemorrhagic contusion. 3. No skull fracture identified. Salient findings discussed by telephone with Dr. Trinna Post on 06/24/2022 at 12:21. Electronically Signed   By: Odessa Fleming M.D.   On: 06/24/2022 12:22   DG Thoracic Spine 2 View  Result Date: 06/24/2022 CLINICAL DATA:  82 year old male status post fall with epistaxis. Hypertensive, 204/102. Previous right hip replacement. EXAM: THORACIC SPINE 2 VIEWS COMPARISON:  Portable chest 06/12/2021. Cervical spine CT  today reported separately. FINDINGS: Thoracic segmentation appears to be normal. There is a degree of generalized osteopenia. Maintained thoracic vertebral height, with mildly exaggerated thoracic kyphosis. Cervicothoracic junction alignment is within normal limits. Relatively maintained disc spaces throughout. Widespread mild endplate spurring. No acute osseous abnormality identified. Grossly intact posterior ribs. Calcified aortic atherosclerosis. Normal cardiac size and mediastinal contours. Lung ventilation appears stable from last year, within normal limits. Negative visible bowel gas. Cholecystectomy clips. IMPRESSION: 1. No acute osseous abnormality identified in the thoracic spine. 2. Aortic Atherosclerosis (ICD10-I70.0). Electronically Signed   By: Odessa Fleming M.D.   On: 06/24/2022 06:03   DG Hip Unilat W or Wo Pelvis 2-3 Views Left  Result Date: 06/24/2022 CLINICAL DATA:  82 year old male status post fall with epistaxis. Hypertensive, 204/102. Previous right hip replacement. EXAM: DG HIP (WITH OR WITHOUT PELVIS) 2-3V LEFT COMPARISON:  Postoperative pelvis radiographs 06/13/2021. FINDINGS: Chronic right hip arthroplasty. Femoral stem mostly visible, hardware appears stable and aligned. Left femoral head also normally located. On dedicated AP and frogleg lateral views the proximal left femur appears intact. Pelvis appears stable and intact. Advanced bilateral iliofemoral calcified atherosclerosis. Advanced lower lumbar disc degeneration with vacuum disc. Nonobstructed bowel-gas pattern. IMPRESSION: 1. No acute fracture or dislocation identified about the left hip or pelvis. 2. Stable right hip arthroplasty. Calcified iliofemoral atherosclerosis. Electronically Signed   By: Odessa Fleming M.D.   On: 06/24/2022 06:01   CT Cervical Spine Wo Contrast  Result Date: 06/24/2022 CLINICAL DATA:  82 year old male status post fall with epistaxis. Hypertensive, 204/102. subdural hematoma. EXAM: CT CERVICAL SPINE WITHOUT  CONTRAST TECHNIQUE: Multidetector CT imaging of the cervical spine was performed without intravenous contrast. Multiplanar CT image reconstructions were also generated. RADIATION DOSE REDUCTION: This exam was performed according to the departmental dose-optimization program which includes automated exposure control, adjustment of the mA and/or kV according to patient size and/or use of iterative reconstruction technique. COMPARISON:  CT head and face today reported separately. Previous cervical spine CT 06/12/2021. FINDINGS: Alignment: Stable straightening of cervical lordosis. Cervicothoracic junction alignment is within normal limits. Bilateral posterior element alignment is within normal limits. Skull base and vertebrae: Visualized skull base is intact. No atlanto-occipital dissociation. C1 and C2 appear intact and aligned. No acute osseous abnormality identified. Soft tissues and spinal canal: Right tentorial and basilar cistern subdural blood redemonstrated in the visible posterior fossa. Hemorrhage likely extends to the ventral cisterna magna, and perhaps to the ventral C1-odontoid spinal canal.  But there is underlying mildly hyperdense chronic ligamentous hypertrophy also about the odontoid. No convincing intra canal hemorrhage below C1. No prevertebral soft tissue swelling or edema. Calcified carotid bifurcation atherosclerosis. Negative visible other noncontrast neck soft tissues. Disc levels: Chronic cervical spine degeneration, especially severe disc degeneration C5-C6 and C6-C7 appears stable from last year. Up to mild multifactorial cervical spinal stenosis. Upper chest: Visible upper thoracic levels appear intact. Mild apical lung scarring and emphysema. IMPRESSION: 1. No acute traumatic injury identified in the cervical spine. 2. Hemorrhage from the ventral posterior fossa subdural hematoma likely tracks to the ventral cisterna magna and perhaps as far as C1. No other convincing cervical canal blood.  3. 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 4.  Emphysema (ICD10-J43.9). Electronically Signed   By: Odessa Fleming M.D.   On: 06/24/2022 05:59   CT Maxillofacial Wo Contrast  Result Date: 06/24/2022 CLINICAL DATA:  82 year old male status post fall with epistaxis. Hypertensive, 204/102. subdural hematoma. EXAM: CT MAXILLOFACIAL WITHOUT CONTRAST TECHNIQUE: Multidetector CT imaging of the maxillofacial structures was performed. Multiplanar CT image reconstructions were also generated. RADIATION DOSE REDUCTION: This exam was performed according to the departmental dose-optimization program which includes automated exposure control, adjustment of the mA and/or kV according to patient size and/or use of iterative reconstruction technique. COMPARISON:  Head CT today reported separately. Separate cervical spine CT. FINDINGS: Osseous: Osteopenia. Mandible appears intact and normally located. Absent maxillary dentition. Bilateral maxilla, zygoma, pterygoid, and nasal bones appear intact. Central skull base appears intact. Cervical spine detailed separately. Orbits: No orbital wall fracture. Soft tissue swelling medial canthus left orbit, somewhat contiguous with left forehead soft tissue injury. Postoperative changes to both globes. Globes and intraorbital soft tissues appear intact. Sinuses: Previous bilateral paranasal sinus surgery. Trace retained secretions at the left maxillary ostium, layering in the left maxillary sinus. Tympanic cavities and mastoids are clear. Soft tissues: Left forehead scalp soft tissue injury detailed separately. Negative visible noncontrast larynx, pharynx, parapharyngeal spaces, retropharyngeal space, sublingual space, submandibular spaces, masticator and parotid spaces. Calcified carotid artery atherosclerosis in the neck. No upper cervical lymphadenopathy. Limited intracranial: Widespread right side and central skull base subdural hematoma as detailed separately. IMPRESSION:  1. No facial fracture identified. Medial left orbit and left forehead scalp soft tissue injury. 2. Subdural hematoma at the central skull base and in the right hemisphere detailed separately. Electronically Signed   By: Odessa Fleming M.D.   On: 06/24/2022 05:54   CT Head Wo Contrast  Addendum Date: 06/24/2022   ADDENDUM REPORT: 06/24/2022 05:46 ADDENDUM: Study discussed by telephone with Dr. Loleta Rose on 06/24/2022 at 0541 hours. Electronically Signed   By: Odessa Fleming M.D.   On: 06/24/2022 05:46   Result Date: 06/24/2022 CLINICAL DATA:  82 year old male status post fall with epistaxis. Hypertensive, 204/102. EXAM: CT HEAD WITHOUT CONTRAST TECHNIQUE: Contiguous axial images were obtained from the base of the skull through the vertex without intravenous contrast. RADIATION DOSE REDUCTION: This exam was performed according to the departmental dose-optimization program which includes automated exposure control, adjustment of the mA and/or kV according to patient size and/or use of iterative reconstruction technique. COMPARISON:  Face and cervical spine CT today reported separately. Prior head CT 06/12/2021. FINDINGS: Brain: Mixed density but mostly hyperdense right side subdural hematoma tracking onto the tentorium varies from 5-9 mm in thickness throughout the right hemisphere. Associated intracranial mass effect with 5 mm of leftward midline shift at the anterior septum pellucidum. Mild effacement of the right lateral  ventricle. No ventriculomegaly. No IVH. But there is hyperdense subdural hematoma also tracking over the dorsal clivus, in the prepontine and pre medullary areas on series 2 images 6 and 7. But basilar cisterns remain patent. No mass effect on the brainstem. No convincing subarachnoid hemorrhage. No cerebral edema, acute cortically based infarct identified. Vascular: Calcified atherosclerosis at the skull base. No suspicious intracranial vascular hyperdensity. Skull: No fracture identified.  Sinuses/Orbits: Trace fluid in the left maxillary sinus. Overall Visualized paranasal sinuses and mastoids are stable and well aerated. Other: Left forehead scalp laceration on series 3, image 25. Associated scalp hematoma there, tracking to the medial canthus of the left orbit. The globes and intraorbital soft tissues appear to remain intact. Left frontal bone and frontal sinus there appear to remain intact. Other scalp soft tissues appear stable. IMPRESSION: 1. Positive for widespread right hemisphere Subdural hematoma, tracking onto the right tentorium and also inferiorly along the dorsal clivus toward the foramen magnum. SDH is up to 9 mm in thickness. 2. Associated mild intracranial mass effect with 5 mm of leftward midline shift. 3. No other acute traumatic injury to the brain. 4. Left forehead scalp laceration and hematoma with No skull fracture identified. Electronically Signed: By: Odessa Fleming M.D. On: 06/24/2022 05:38             LOS: 2 days      Mitchell Nielsen, DO [facility] 06/26/2022, 2:10 PM    Dictation software may have been used to generate the above note. Typos may occur and escape review in typed/dictated notes. Please contact Dr Lyn Hollingshead directly for clarity if needed.  Staff may message me via secure chat in Epic  but this may not receive an immediate response,  please page me for urgent matters!  If 7PM-7AM, please contact night coverage [email or web address]

## 2022-06-27 ENCOUNTER — Other Ambulatory Visit: Payer: Medicare HMO

## 2022-06-27 ENCOUNTER — Inpatient Hospital Stay (HOSPITAL_COMMUNITY)
Admit: 2022-06-27 | Discharge: 2022-06-27 | Disposition: A | Discharge status: Home / Self Care | Payer: Medicare HMO | Attending: Internal Medicine | Admitting: Internal Medicine

## 2022-06-27 DIAGNOSIS — I361 Nonrheumatic tricuspid (valve) insufficiency: Secondary | ICD-10-CM

## 2022-06-27 DIAGNOSIS — R7989 Other specified abnormal findings of blood chemistry: Secondary | ICD-10-CM | POA: Diagnosis not present

## 2022-06-27 DIAGNOSIS — S065XAA Traumatic subdural hemorrhage with loss of consciousness status unknown, initial encounter: Secondary | ICD-10-CM | POA: Diagnosis not present

## 2022-06-27 LAB — BASIC METABOLIC PANEL
Anion gap: 5 (ref 5–15)
BUN: 30 mg/dL — ABNORMAL HIGH (ref 8–23)
CO2: 28 mmol/L (ref 22–32)
Calcium: 8.4 mg/dL — ABNORMAL LOW (ref 8.9–10.3)
Chloride: 104 mmol/L (ref 98–111)
Creatinine, Ser: 1.11 mg/dL (ref 0.61–1.24)
GFR, Estimated: >60 mL/min (ref >60)
Glucose, Bld: 115 mg/dL — ABNORMAL HIGH (ref 70–99)
Potassium: 4.1 mmol/L (ref 3.5–5.1)
Sodium: 137 mmol/L (ref 135–145)

## 2022-06-27 LAB — CBC
HCT: 28 % — ABNORMAL LOW (ref 39.0–52.0)
Hemoglobin: 8.6 g/dL — ABNORMAL LOW (ref 13.0–17.0)
MCH: 30.8 pg (ref 26.0–34.0)
MCHC: 30.7 g/dL (ref 30.0–36.0)
MCV: 100.4 fL — ABNORMAL HIGH (ref 80.0–100.0)
Platelets: 209 10*3/uL (ref 150–400)
RBC: 2.79 MIL/uL — ABNORMAL LOW (ref 4.22–5.81)
RDW: 15.2 % (ref 11.5–15.5)
WBC: 12.4 10*3/uL — ABNORMAL HIGH (ref 4.0–10.5)
nRBC: 0 % (ref 0.0–0.2)

## 2022-06-27 LAB — ECHOCARDIOGRAM COMPLETE
AR max vel: 3.14 cm2
AV Area VTI: 2.98 cm2
AV Area mean vel: 2.75 cm2
AV Mean grad: 2 mmHg
AV Peak grad: 4.7 mmHg
Ao pk vel: 1.08 m/s
Area-P 1/2: 2.45 cm2
Height: 66 in
MV VTI: 2.57 cm2
S' Lateral: 2.1 cm
Weight: 2289.26 oz

## 2022-06-27 LAB — TROPONIN I (HIGH SENSITIVITY): Troponin I (High Sensitivity): 163 ng/L (ref <18)

## 2022-06-27 MED ORDER — ACETAMINOPHEN 325 MG PO TABS
650.0000 mg | ORAL_TABLET | Freq: Four times a day (QID) | ORAL | Status: DC | PRN
  Administered 2022-06-27 – 2022-07-01 (×6): 650 mg via ORAL
  Filled 2022-06-27 (×5): qty 2

## 2022-06-27 MED ORDER — NAPHAZOLINE-GLYCERIN 0.012-0.25 % OP SOLN
1.0000 [drp] | Freq: Four times a day (QID) | OPHTHALMIC | Status: DC | PRN
  Administered 2022-06-30: 1 [drp] via OPHTHALMIC
  Administered 2022-07-01: 2 [drp] via OPHTHALMIC

## 2022-06-27 MED ORDER — PROCHLORPERAZINE EDISYLATE 10 MG/2ML IJ SOLN: 5.0000 mg | Freq: Four times a day (QID) | INTRAMUSCULAR | Status: DC | PRN

## 2022-06-27 NOTE — TOC Progression Note (Signed)
Transition of Care [facility]) - Progression Note    Patient Details  Name: Mitchell Ayers MRN: [id number] Date of Birth: 1940-08-30  [facility] CM/SW Contact  Darolyn Rua, [place] Phone Number: 06/27/2022, 11:06 AM  Clinical Narrative:     CSW met with RN, PT and patient at bedside. Per PT patient has much improved in mobility, recs changing to home health. Patient will need RW delivered to bedside via adapt, [facility] RN PT OT and aide . MD aware of above and will complete HH orders. [facility] referral [facility] to [facility] pending acceptance. Patient agreeable to plan at this time.    Expected Discharge Plan:  (TBD) Barriers to Discharge: Continued Medical Work up  Expected Discharge Plan and Services       Living arrangements for the past 2 months: Single Family Home                                       Social Determinants of Health (SDOH) Interventions SDOH Screenings   Food Insecurity: No Food Insecurity (06/25/2022)  Housing: Low Risk  (06/25/2022)  Transportation Needs: No Transportation Needs (06/25/2022)  Utilities: Not At Risk (06/25/2022)  Tobacco Use: Medium Risk (06/26/2022)    Readmission Risk Interventions     No data to display

## 2022-06-27 NOTE — Progress Notes (Signed)
   Neurosurgery Progress Note  History: Mitchell Ayers is here for subdural hematoma.  He underwent craniotomy for subdural hematoma evacuation on June 24, 2022.  POD3: Pt attempting to ambulate to the bathroom with assistance  POD2: Complains of intermittent headache but overall doing well  POD 1: He is doing much better.  He has minimal headache.  He is much brighter.  Physical Exam: Vitals:   06/27/22 0600 06/27/22 0700  BP: (!) 99/52 (!) 91/52  Pulse: 63 62  Resp: 20 20  Temp: 99.3 F (37.4 C) 99.3 F (37.4 C)  SpO2: 98% 99%    AA Ox3 (says May but knows the year)  CNI No drift Strength:5/5 throughout bilateral upper extremities  Data:  Other tests/results: CT scan reviewed-expected postoperative fluid with resolution of midline shift and improvement in brain compression  Assessment/Plan:  Dekoda Franssen is substantially improved after craniotomy for evacuation of an acute subdural hematoma.  - mobilize - pain control - DVT prophylaxis okay to start - PTOT -drain removed 6/19  - dressing removed. Ok to leave incision open to air  Manning Charity PA-C Department of Neurosurgery

## 2022-06-27 NOTE — Telephone Encounter (Signed)
Patient is still admitted.

## 2022-06-27 NOTE — Progress Notes (Signed)
Physical Therapy Treatment Patient Details Name: Mitchell Ayers MRN: [id number] DOB: July 01, 1940 Today's Date: 06/27/2022   History of Present Illness Patient is a 82 year old male presenting after hitting his nose resulting in a nose bleed after mechanical fall.  Patient had enlarging subdural hematoma, s/p craniotomy for evacuation of an acute subdural hematoma.    PT Comments    Patient sitting in recliner and agreeable to session. Min guard for sit<>stand with RW. Ambulated 147ft with min assist +2 for telemetry. Verbal cueing to instruct the patient to look straight ahead. Min assist for bed mobility, support needed at legs. Patient left in bed with call bell and nursing in room. Communicated mobility and discharge recommendation with TOC and nurse.    Recommendations for follow up therapy are one component of a multi-disciplinary discharge planning process, led by the attending physician.  Recommendations may be updated based on patient status, additional functional criteria and insurance authorization.  Follow Up Recommendations  Can patient physically be transported by private vehicle: Yes    Assistance Recommended at Discharge Intermittent Supervision/Assistance  Patient can return home with the following A little help with walking and/or transfers;A little help with bathing/dressing/bathroom;Assistance with cooking/housework;Assist for transportation;Help with stairs or ramp for entrance   Equipment Recommendations  Rolling walker (2 wheels)    Recommendations for Other Services       Precautions / Restrictions Precautions Precautions: Fall Restrictions Weight Bearing Restrictions: No     Mobility  Bed Mobility Overal bed mobility: Needs Assistance Bed Mobility: Sit to Supine     Supine to sit: Min assist     General bed mobility comments: Min assist to get legs into bed due to the height of bed. Verbal cues for straightening up in bed.    Transfers Overall  transfer level: Needs assistance Equipment used: Rolling walker (2 wheels) Transfers: Sit to/from Stand Sit to Stand: Min guard           General transfer comment: stood with no assist, guard provided due to first experience with patient. proper use of DME during transfer.    Ambulation/Gait Ambulation/Gait assistance: +2 safety/equipment, Min guard (+2 for chair follow) Gait Distance (Feet): 150 Feet Assistive device: Rolling walker (2 wheels) Gait Pattern/deviations: Step-through pattern, Decreased step length - right, Decreased step length - left, Trunk flexed       General Gait Details: Verbal cues for looking straight and maintaining appropriate walker distance.   Stairs             Wheelchair Mobility    Modified Rankin (Stroke Patients Only)       Balance Overall balance assessment: Needs assistance Sitting-balance support: Feet supported Sitting balance-Leahy Scale: Good     Standing balance support: Bilateral upper extremity supported, During functional activity, Reliant on assistive device for balance Standing balance-Leahy Scale: Good Standing balance comment: RW used for support during ambulation. able to weight shift during gait safely.                            Cognition Arousal/Alertness: Awake/alert Behavior During Therapy: Flat affect Overall Cognitive Status: Within Functional Limits for tasks assessed                                 General Comments: Patient was able to recall previous PT session. AOx4.        Exercises  General Comments        Pertinent Vitals/Pain Pain Assessment Pain Assessment: No/denies pain    Home Living                          Prior Function            PT Goals (current goals can now be found in the care plan section) Acute Rehab PT Goals Patient Stated Goal: to return home, family wants SNF PT Goal Formulation: With patient/family Time For Goal  Achievement: 07/09/22 Potential to Achieve Goals: Good Progress towards PT goals: Progressing toward goals    Frequency    Min 3X/week      PT Plan Discharge plan needs to be updated    Co-evaluation              AM-PAC PT "6 Clicks" Mobility   Outcome Measure  Help needed turning from your back to your side while in a flat bed without using bedrails?: A Little Help needed moving from lying on your back to sitting on the side of a flat bed without using bedrails?: A Little Help needed moving to and from a bed to a chair (including a wheelchair)?: A Little Help needed standing up from a chair using your arms (e.g., wheelchair or bedside chair)?: A Little Help needed to walk in hospital room?: A Little Help needed climbing 3-5 steps with a railing? : A Lot 6 Click Score: 17    End of Session Equipment Utilized During Treatment: Gait belt Activity Tolerance: Patient tolerated treatment well Patient left: in bed;with call bell/phone within reach;with nursing/sitter in room Nurse Communication: Mobility status PT Visit Diagnosis: Other abnormalities of gait and mobility (R26.89);History of falling (Z91.81);Muscle weakness (generalized) (M62.81);Difficulty in walking, not elsewhere classified (R26.2)     Time: 1610-9604 PT Time Calculation (min) (ACUTE ONLY): 18 min  Charges:                        Malachi Carl, SPT    Malachi Carl 06/27/2022, 12:50 PM

## 2022-06-27 NOTE — Progress Notes (Signed)
*  PRELIMINARY RESULTS* Echocardiogram 2D Echocardiogram has been performed.  Mitchell Ayers 06/27/2022, 2:12 PM

## 2022-06-27 NOTE — Progress Notes (Signed)
PROGRESS NOTE  Mitchell Ayers ZOX:[id number] DOB: 1940-10-03 DOA: 06/24/2022 PCP: Marlan Palau  HPI/Recap of past 24 hours: 82 yo male with hypertension, hyperlipidemia, hypothyroidism, rheumatoid arthritis who presented to [facility] ER on 06/17 via EMS after hitting his nose resulting in a nosebleed following a mechanical fall. Pt ambulates with a walker at baseline and it slipped out from under him.  06/17: admitted for subdural hematoma.  He underwent craniotomy for evacuation.  06/18: doing well post-op, remains in ICU for close monitoring. Participating w/ PT/OT. Repeat CT Head revealed nearly resolved midline shift after right subdural hematoma decompression and no new abnormality.  Neurosurgery ok with transitioning pt to stepdown status  06/19: transfer to hospitalist service. Trops remain elevated, will get Echo. 06/27/2022: The patient was seen and examined at his bedside.  Complains of a headache not relieved by Tylenol.  Seen by physical therapy with recommendation for home health PT OT.  His elderly wife is concerned about his return home in this condition, due to his generalized weakness.    Assessment/Plan: Principal Problem:   Subdural hematoma (HCC) Active Problems:   Traumatic subdural hematoma with loss of consciousness (HCC)   Compression of brain (HCC)  Subdural hematoma secondary to mechanical fall s/p right craniotomy  Hx: Normocytic anemia  Continue HOA 30 degrees per neurosurgery recommendations  Continue Keppra 500 mg orally bid x7 days  Continue to trend CBC  Continue to monitor for s/sx of bleeding  Continue PT OT with assistance and fall precautions.  Acute blood loss anemia in the setting of subdural hematoma and craniotomy Hemoglobin 8.6 from 10.3, 2 days ago Repeat CBC in the morning.  Elevated troponin, suspect from brain injury. High-sensitivity troponin peaked at 683 and downtrending to 163. Follow-up 2D echo, will consult cardiology if  abnormal. Continue to monitor on telemetry  Leukocytosis, suspect reactive in the setting of brain injury and surgery WBC 12.4 from 17.5 Not on antibiotics. Afebrile.  Nonseptic appearing.   HTN  Hx: HLD  Blood pressure is currently at goal, normotensive. Continue to monitor vital signs   Hypothyroidism  Continue outpatient synthroid    Rheumatoid arthritis  Continue outpatient methotrexate   Ambulatory dysfunction Continue PT OT with assistance and fall precautions.     DVT prophylaxis: Subcu Lovenox daily. Pertinent IV fluids/nutrition: None.    Code Status: FULL CODE  ACP documentation reviewed: Not on file.   Current Admission Status: Inpatient status. TOC needs / Dispo plan: Home health PT OT versus inpatient rehab. Barriers to discharge / significant pending items: Family is concerned about having unsafe discharge to home with home health services at this time.  The patient's wife states she cannot take care of him at this time due to his generalized weakness.   Objective: Vitals:   06/27/22 0900 06/27/22 0932 06/27/22 1000 06/27/22 1100  BP:  98/61 112/84 (!) 157/63  Pulse: 90  74 (!) 31  Resp:   (!) 22 19  Temp:      TempSrc:      SpO2: 100%  100% (!) 81%  Weight:      Height:        Intake/Output Summary (Last 24 hours) at 06/27/2022 1304 Last data filed at 06/27/2022 1010 Gross per 24 hour  Intake 920 ml  Output 785 ml  Net 135 ml   Filed Weights   06/24/22 1726 06/25/22 0330 06/26/22 0300  Weight: 69.3 kg 66.1 kg 64.9 kg    Exam:  General: 82 y.o. year-old  male frail-appearing in no acute distress.  Alert and oriented x3. Cardiovascular: Regular rate and rhythm with no rubs or gallops.  No thyromegaly or JVD noted.   Respiratory: Clear to auscultation with no wheezes or rales. Good inspiratory effort. Abdomen: Soft nontender nondistended with normal bowel sounds x4 quadrants. Musculoskeletal: No lower extremity edema. 2/4 pulses in all 4  extremities. Skin: Scalp stitches noted. Psychiatry: Mood is appropriate for condition and setting   Data Reviewed: CBC: Recent Labs  Lab 06/24/22 0546 06/25/22 0442 06/27/22 0536  WBC 12.6* 17.5* 12.4*  NEUTROABS 9.6* 15.8*  --   HGB 11.4* 10.3* 8.6*  HCT 36.7* 32.9* 28.0*  MCV 99.7 99.1 100.4*  PLT 258 243 209   Basic Metabolic Panel: Recent Labs  Lab 06/24/22 0546 06/25/22 0442 06/26/22 0411 06/27/22 0536  NA 137 137 139 137  K 4.2 4.2 4.0 4.1  CL 100 105 107 104  CO2 26 24 26 28   GLUCOSE 99 149* 126* 115*  BUN 22 24* 27* 30*  CREATININE 1.08 1.41* 1.04 1.11  CALCIUM 9.1 8.2* 8.3* 8.4*   GFR: Estimated Creatinine Clearance: 45.5 mL/min (by C-G formula based on SCr of 1.11 mg/dL). Liver Function Tests: No results for input(s): "AST", "ALT", "ALKPHOS", "BILITOT", "PROT", "ALBUMIN" in the last 168 hours. No results for input(s): "LIPASE", "AMYLASE" in the last 168 hours. No results for input(s): "AMMONIA" in the last 168 hours. Coagulation Profile: Recent Labs  Lab 06/24/22 0546  INR 1.0   Cardiac Enzymes: No results for input(s): "CKTOTAL", "CKMB", "CKMBINDEX", "TROPONINI" in the last 168 hours. BNP (last 3 results) No results for input(s): "PROBNP" in the last 8760 hours. HbA1C: No results for input(s): "HGBA1C" in the last 72 hours. CBG: Recent Labs  Lab 06/24/22 1725  GLUCAP 147*   Lipid Profile: No results for input(s): "CHOL", "HDL", "LDLCALC", "TRIG", "CHOLHDL", "LDLDIRECT" in the last 72 hours. Thyroid Function Tests: No results for input(s): "TSH", "T4TOTAL", "FREET4", "T3FREE", "THYROIDAB" in the last 72 hours. Anemia Panel: No results for input(s): "VITAMINB12", "FOLATE", "FERRITIN", "TIBC", "IRON", "RETICCTPCT" in the last 72 hours. Urine analysis: No results found for: "COLORURINE", "APPEARANCEUR", "LABSPEC", "PHURINE", "GLUCOSEU", "HGBUR", "BILIRUBINUR", "KETONESUR", "PROTEINUR", "UROBILINOGEN", "NITRITE", "LEUKOCYTESUR" Sepsis  Labs: @LABRCNTIP (procalcitonin:4,lacticidven:4)  ) Recent Results (from the past 240 hour(s))  MRSA Next Gen by PCR, Nasal     Status: None   Collection Time: 06/24/22  7:30 PM   Specimen: Nasal Mucosa; Nasal Swab  Result Value Ref Range Status   MRSA by PCR Next Gen NOT DETECTED NOT DETECTED Final    Comment: (NOTE) The GeneXpert MRSA Assay (FDA approved for NASAL specimens only), is one component of a comprehensive MRSA colonization surveillance program. It is not intended to diagnose MRSA infection nor to guide or monitor treatment for MRSA infections. Test performance is not FDA approved in patients less than 52 years old. Performed at [facility], [address], [place], [place] [address]       Studies: No results found.  Scheduled Meds:  atorvastatin  20 mg Oral Daily   azelastine  1 spray Each Nare BID   Chlorhexidine Gluconate Cloth  6 each Topical Daily   clorazepate  7.5 mg Oral TID   enoxaparin (LOVENOX) injection  40 mg Subcutaneous Daily   feeding supplement  237 mL Oral TID BM   folic acid  2 mg Oral Daily   gabapentin  100 mg Oral BID   levETIRAcetam  500 mg Oral BID   levothyroxine  100  mcg Oral Daily   lisinopril  5 mg Oral BID   [START ON 06/30/2022] methotrexate  12.5 mg Oral Weekly   multivitamin with minerals  1 tablet Oral Daily   pantoprazole  40 mg Oral q morning    Continuous Infusions:   LOS: 3 days     Darlin Drop, MD [facility] Pager [phone]  If 7PM-7AM, please contact night-coverage [email or web address] Password [email or web address] 06/27/2022, 1:04 PM

## 2022-06-27 NOTE — Progress Notes (Signed)
Patient received in stable condition from ICU via w/c. Pt ambulated with steady gait and min assist to bed. Staples to right side well approx and intact with no drainage noted.

## 2022-06-27 NOTE — Progress Notes (Addendum)
0730 CHG bath given. 0800 Walked to chair with front wheel walker and minimal assistance. 0900 Up to [facility] .Had BM. 1000 Back and forth communication between hospitalist,patient ,Child psychotherapist and nurse concerning his discharge. Patient refuses to go to a SNF. 1100 Walked length of ICU unit front and back.reassessed per RT.

## 2022-06-28 DIAGNOSIS — S065XAA Traumatic subdural hemorrhage with loss of consciousness status unknown, initial encounter: Secondary | ICD-10-CM | POA: Diagnosis not present

## 2022-06-28 LAB — CBC
HCT: 28.8 % — ABNORMAL LOW (ref 39.0–52.0)
Hemoglobin: 9.2 g/dL — ABNORMAL LOW (ref 13.0–17.0)
MCH: 31.1 pg (ref 26.0–34.0)
MCHC: 31.9 g/dL (ref 30.0–36.0)
MCV: 97.3 fL (ref 80.0–100.0)
Platelets: 228 10*3/uL (ref 150–400)
RBC: 2.96 MIL/uL — ABNORMAL LOW (ref 4.22–5.81)
RDW: 15.1 % (ref 11.5–15.5)
WBC: 10.2 10*3/uL (ref 4.0–10.5)
nRBC: 0 % (ref 0.0–0.2)

## 2022-06-28 LAB — BASIC METABOLIC PANEL
Anion gap: 7 (ref 5–15)
BUN: 28 mg/dL — ABNORMAL HIGH (ref 8–23)
CO2: 25 mmol/L (ref 22–32)
Calcium: 8.6 mg/dL — ABNORMAL LOW (ref 8.9–10.3)
Chloride: 100 mmol/L (ref 98–111)
Creatinine, Ser: 1.09 mg/dL (ref 0.61–1.24)
GFR, Estimated: >60 mL/min (ref >60)
Glucose, Bld: 124 mg/dL — ABNORMAL HIGH (ref 70–99)
Potassium: 4.1 mmol/L (ref 3.5–5.1)
Sodium: 132 mmol/L — ABNORMAL LOW (ref 135–145)

## 2022-06-28 MED ORDER — NAPHAZOLINE-PHENIRAMINE 0.025-0.3 % OP SOLN
2.0000 [drp] | Freq: Once | OPHTHALMIC | Status: AC
  Administered 2022-06-28: 2 [drp] via OPHTHALMIC
  Filled 2022-06-28: qty 5

## 2022-06-28 NOTE — Care Management Important Message (Signed)
Important Message  Patient Details  Name: Mitchell Ayers MRN: [id number] Date of Birth: [birth year]   Medicare Important Message Given:  N/A - LOS <3 / Initial given by admissions     [name] 06/28/2022, 8:53 AM

## 2022-06-28 NOTE — Progress Notes (Signed)
Patient continues to be unable to void, despite now feeling pressure to do so. Bladder scan demonstrated retention. MD notified and order to I&O obtained. of clear yellow urine returned.

## 2022-06-28 NOTE — Progress Notes (Signed)
PROGRESS NOTE  Mitchell Ayers GEX:[id number] DOB: [birth year] DOA: 06/24/2022 PCP: Marlan Palau  HPI/Recap of past 24 hours: 82 yo male with hypertension, hyperlipidemia, hypothyroidism, rheumatoid arthritis who presented to [facility] ER on 06/17 via EMS after hitting his nose resulting in a nosebleed following a mechanical fall. Pt ambulates with a walker at baseline and it slipped out from under him.  06/17: admitted for subdural hematoma.  He underwent craniotomy for evacuation.  06/18: doing well post-op, remains in ICU for close monitoring. Participating w/ PT/OT. Repeat CT Head revealed nearly resolved midline shift after right subdural hematoma decompression and no new abnormality.  Neurosurgery ok with transitioning pt to stepdown status  06/19: transfer to hospitalist service. Trops remain elevated.  No regional wall motion abnormalities on 2D echo.  06/28/2022: The patient was seen and examined at his bedside.  Seen by PT OT for generalized weakness.  Declines SNF placement.  Per PT the patient will benefit from home health PT OT.    Assessment/Plan: Principal Problem:   Subdural hematoma (HCC) Active Problems:   Traumatic subdural hematoma with loss of consciousness (HCC)   Compression of brain (HCC)  Subdural hematoma secondary to mechanical fall s/p right craniotomy  Hx: Normocytic anemia  Continue Keppra 500 mg orally bid x7 days  Continue PT OT with assistance and fall precautions.  Acute blood loss anemia in the setting of subdural hematoma and craniotomy Hemoglobin is uptrending 9.2 from 8.6. No overt bleeding reported in the past 24 hours.  Elevated troponin, suspect from brain injury. High-sensitivity troponin peaked at 683 and downtrending to 163. No wall motion abnormalities on 2D echo.  Resolved leukocytosis, suspect reactive in the setting of brain injury and surgery   HTN  HLD  Blood pressure is currently at goal, normotensive. Continue to monitor vital  signs   Hypothyroidism  Continue outpatient synthroid    Rheumatoid arthritis  Continue outpatient methotrexate   Ambulatory dysfunction Continue PT OT with assistance and fall precautions.     DVT prophylaxis: Subcu Lovenox daily. Pertinent IV fluids/nutrition: None.    Code Status: FULL CODE  ACP documentation reviewed: Not on file.   Current Admission Status: Inpatient status. TOC needs / Dispo plan: Anticipate discharge to home with home health PT OT on 06/29/2022. Barriers to discharge / significant pending items: Family is concerned about having unsafe discharge to home with home health services.    Objective: Vitals:   06/27/22 2322 06/28/22 0144 06/28/22 0443 06/28/22 0805  BP: 129/64  (!) 120/55 (!) 108/56  Pulse: 90  73 65  Resp: (!) 24  16 17   Temp: 99.3 F (37.4 C)  98.7 F (37.1 C) 98.1 F (36.7 C)  TempSrc:      SpO2: 95%  97% 99%  Weight:  75.6 kg    Height:        Intake/Output Summary (Last 24 hours) at 06/28/2022 1318 Last data filed at 06/28/2022 0036 Gross per 24 hour  Intake 90 ml  Output 50 ml  Net 40 ml   Filed Weights   06/25/22 0330 06/26/22 0300 06/28/22 0144  Weight: 66.1 kg 64.9 kg 75.6 kg    Exam:  General: 82 y.o. year-old male frail-appearing in no acute distress.  Alert and oriented x3. Cardiovascular: Heart rate regular rate and rhythm no rubs or gallops.   Respiratory: Clear to auscultation with no wheezes or rales.  Good inspiratory effort. Abdomen: Soft nontender nondistended with normal bowel sounds x4 quadrants. Musculoskeletal: No lower extremity  edema. 2/4 pulses in all 4 extremities. Skin: Scalp stitches noted. Psychiatry: Mood is appropriate for condition and setting   Data Reviewed: CBC: Recent Labs  Lab 06/24/22 0546 06/25/22 0442 06/27/22 0536 06/28/22 0637  WBC 12.6* 17.5* 12.4* 10.2  NEUTROABS 9.6* 15.8*  --   --   HGB 11.4* 10.3* 8.6* 9.2*  HCT 36.7* 32.9* 28.0* 28.8*  MCV 99.7 99.1 100.4* 97.3   PLT 258 243 209 228   Basic Metabolic Panel: Recent Labs  Lab 06/24/22 0546 06/25/22 0442 06/26/22 0411 06/27/22 0536 06/28/22 0637  NA 137 137 139 137 132*  K 4.2 4.2 4.0 4.1 4.1  CL 100 105 107 104 100  CO2 26 24 26 28 25   GLUCOSE 99 149* 126* 115* 124*  BUN 22 24* 27* 30* 28*  CREATININE 1.08 1.41* 1.04 1.11 1.09  CALCIUM 9.1 8.2* 8.3* 8.4* 8.6*   GFR: Estimated Creatinine Clearance: 46.3 mL/min (by C-G formula based on SCr of 1.09 mg/dL). Liver Function Tests: No results for input(s): "AST", "ALT", "ALKPHOS", "BILITOT", "PROT", "ALBUMIN" in the last 168 hours. No results for input(s): "LIPASE", "AMYLASE" in the last 168 hours. No results for input(s): "AMMONIA" in the last 168 hours. Coagulation Profile: Recent Labs  Lab 06/24/22 0546  INR 1.0   Cardiac Enzymes: No results for input(s): "CKTOTAL", "CKMB", "CKMBINDEX", "TROPONINI" in the last 168 hours. BNP (last 3 results) No results for input(s): "PROBNP" in the last 8760 hours. HbA1C: No results for input(s): "HGBA1C" in the last 72 hours. CBG: Recent Labs  Lab 06/24/22 1725  GLUCAP 147*   Lipid Profile: No results for input(s): "CHOL", "HDL", "LDLCALC", "TRIG", "CHOLHDL", "LDLDIRECT" in the last 72 hours. Thyroid Function Tests: No results for input(s): "TSH", "T4TOTAL", "FREET4", "T3FREE", "THYROIDAB" in the last 72 hours. Anemia Panel: No results for input(s): "VITAMINB12", "FOLATE", "FERRITIN", "TIBC", "IRON", "RETICCTPCT" in the last 72 hours. Urine analysis: No results found for: "COLORURINE", "APPEARANCEUR", "LABSPEC", "PHURINE", "GLUCOSEU", "HGBUR", "BILIRUBINUR", "KETONESUR", "PROTEINUR", "UROBILINOGEN", "NITRITE", "LEUKOCYTESUR" Sepsis Labs: @LABRCNTIP (procalcitonin:4,lacticidven:4)  ) Recent Results (from the past 240 hour(s))  MRSA Next Gen by PCR, Nasal     Status: None   Collection Time: 06/24/22  7:30 PM   Specimen: Nasal Mucosa; Nasal Swab  Result Value Ref Range Status   MRSA by PCR  Next Gen NOT DETECTED NOT DETECTED Final    Comment: (NOTE) The GeneXpert MRSA Assay (FDA approved for NASAL specimens only), is one component of a comprehensive MRSA colonization surveillance program. It is not intended to diagnose MRSA infection nor to guide or monitor treatment for MRSA infections. Test performance is not FDA approved in patients less than 56 years old. Performed at [facility], [address] [place], [place] [address]       Studies: ECHOCARDIOGRAM COMPLETE  Result Date: 06/27/2022    ECHOCARDIOGRAM REPORT   Patient Name:   Mitchell Ayers Date of Exam: 06/27/2022 Medical Rec #:  [id number]     Height:       66.0 in Accession #:    [id number]    Weight:       143.1 lb Date of Birth:  Aug 15, 1940     BSA:          1.734 m Patient Age:    81 years      BP:           91/52 mmHg Patient Gender: M             HR:  66 bpm. Exam Location:  [facility] Procedure: 2D Echo, Cardiac Doppler and Color Doppler Indications:     Elevated Troponin  History:         Patient has no prior history of Echocardiogram examinations.                  TIA, Risk Factors:Hypertension and Dyslipidemia.;                  Medications:Calcium Channel Blockers.  Sonographer:     Mikki Harbor Referring Phys:  [id number] Oliver Pila Yarielis Funaro Diagnosing Phys: Julien Nordmann MD IMPRESSIONS  1. Left ventricular ejection fraction, by estimation, is 55 to 60%. The left ventricle has normal function. The left ventricle has no regional wall motion abnormalities. Left ventricular diastolic parameters are consistent with Grade I diastolic dysfunction (impaired relaxation).  2. Right ventricular systolic function is normal. The right ventricular size is normal.  3. The mitral valve is normal in structure. No evidence of mitral valve regurgitation. No evidence of mitral stenosis.  4. The aortic valve has an indeterminant number of cusps. Aortic valve regurgitation is not visualized. No aortic stenosis is present.  5. The  inferior vena cava is normal in size with greater than 50% respiratory variability, suggesting right atrial pressure of 3 mmHg. FINDINGS  Left Ventricle: Left ventricular ejection fraction, by estimation, is 55 to 60%. The left ventricle has normal function. The left ventricle has no regional wall motion abnormalities. The left ventricular internal cavity size was normal in size. There is  no left ventricular hypertrophy. Left ventricular diastolic parameters are consistent with Grade I diastolic dysfunction (impaired relaxation). Right Ventricle: The right ventricular size is normal. No increase in right ventricular wall thickness. Right ventricular systolic function is normal. Left Atrium: Left atrial size was normal in size. Right Atrium: Right atrial size was normal in size. Pericardium: There is no evidence of pericardial effusion. Mitral Valve: The mitral valve is normal in structure. Mild mitral annular calcification. No evidence of mitral valve regurgitation. No evidence of mitral valve stenosis. MV peak gradient, 3.5 mmHg. The mean mitral valve gradient is 2.0 mmHg. Tricuspid Valve: The tricuspid valve is normal in structure. Tricuspid valve regurgitation is mild . No evidence of tricuspid stenosis. Aortic Valve: The aortic valve has an indeterminant number of cusps. Aortic valve regurgitation is not visualized. No aortic stenosis is present. Aortic valve mean gradient measures 2.0 mmHg. Aortic valve peak gradient measures 4.7 mmHg. Aortic valve area, by VTI measures 2.98 cm. Pulmonic Valve: The pulmonic valve was normal in structure. Pulmonic valve regurgitation is not visualized. No evidence of pulmonic stenosis. Aorta: The aortic root is normal in size and structure. Venous: The inferior vena cava is normal in size with greater than 50% respiratory variability, suggesting right atrial pressure of 3 mmHg. IAS/Shunts: No atrial level shunt detected by color flow Doppler.  LEFT VENTRICLE PLAX 2D LVIDd:          3.30 cm   Diastology LVIDs:         2.10 cm   LV e' medial:    7.18 cm/s LV PW:         1.10 cm   LV E/e' medial:  9.3 LV IVS:        0.90 cm   LV e' lateral:   8.70 cm/s LVOT diam:     2.00 cm   LV E/e' lateral: 7.7 LV SV:         57 LV SV Index:  33 LVOT Area:     3.14 cm  RIGHT VENTRICLE RV Basal diam:  3.60 cm RV Mid diam:    3.40 cm RV S prime:     13.60 cm/s TAPSE (M-mode): 2.4 cm LEFT ATRIUM             Index        RIGHT ATRIUM           Index LA diam:        3.00 cm 1.73 cm/m   RA Area:     17.20 cm LA Vol (A2C):   37.3 ml 21.51 ml/m  RA Volume:   42.10 ml  24.27 ml/m LA Vol (A4C):   49.8 ml 28.71 ml/m LA Biplane Vol: 41.7 ml 24.04 ml/m  AORTIC VALVE                    PULMONIC VALVE AV Area (Vmax):    3.14 cm     PV Vmax:       1.22 m/s AV Area (Vmean):   2.75 cm     PV Peak grad:  6.0 mmHg AV Area (VTI):     2.98 cm AV Vmax:           108.00 cm/s AV Vmean:          68.500 cm/s AV VTI:            0.193 m AV Peak Grad:      4.7 mmHg AV Mean Grad:      2.0 mmHg LVOT Vmax:         108.00 cm/s LVOT Vmean:        60.000 cm/s LVOT VTI:          0.183 m LVOT/AV VTI ratio: 0.95  AORTA Ao Root diam: 3.60 cm MITRAL VALVE MV Area (PHT): 2.45 cm     SHUNTS MV Area VTI:   2.57 cm     Systemic VTI:  0.18 m MV Peak grad:  3.5 mmHg     Systemic Diam: 2.00 cm MV Mean grad:  2.0 mmHg MV Vmax:       0.94 m/s MV Vmean:      56.9 cm/s MV Decel Time: 310 msec MV E velocity: 67.10 cm/s MV A velocity: 103.00 cm/s MV E/A ratio:  0.65 Julien Nordmann MD Electronically signed by Julien Nordmann MD Signature Date/Time: 06/27/2022/5:15:19 PM    Final     Scheduled Meds:  atorvastatin  20 mg Oral Daily   azelastine  1 spray Each Nare BID   clorazepate  7.5 mg Oral TID   enoxaparin (LOVENOX) injection  40 mg Subcutaneous Daily   feeding supplement  237 mL Oral TID BM   folic acid  2 mg Oral Daily   gabapentin  100 mg Oral BID   levETIRAcetam  500 mg Oral BID   levothyroxine  100 mcg Oral Daily   lisinopril  5  mg Oral BID   [START ON 06/30/2022] methotrexate  12.5 mg Oral Weekly   multivitamin with minerals  1 tablet Oral Daily   pantoprazole  40 mg Oral q morning    Continuous Infusions:   LOS: 4 days     Darlin Drop, MD [facility] Pager [phone]  If 7PM-7AM, please contact night-coverage [email or web address] Password [email or web address] 06/28/2022, 1:18 PM

## 2022-06-28 NOTE — Progress Notes (Signed)
Occupational Therapy Treatment Patient Details Name: Mitchell Ayers MRN: [id number] DOB: 1940/08/15 Today's Date: 06/28/2022   History of present illness Patient is a 82 year old male presenting after hitting his nose resulting in a nose bleed after mechanical fall.  Patient had enlarging subdural hematoma, s/p craniotomy for evacuation of an acute subdural hematoma.   OT comments  Upon entering the room, pt supine in bed with multiple family members present in room. Pt distracted by conversation in room and needing additional cuing for safety and initiation. Min A for trunk support to EOB. Pt stands with min A and ambulates into bathroom with RW. Pt attempting to urinate on commode but unable. RN notified. Pt stands with min A from commode and ambulates 30' into hallway and back to bed with cuing for forward head and to remain within RW. Pt returns to supine with min A. Pt very fatigued with eyes closed. Bed alarm activated and call bell within reach.    Recommendations for follow up therapy are one component of a multi-disciplinary discharge planning process, led by the attending physician.  Recommendations may be updated based on patient status, additional functional criteria and insurance authorization.    Assistance Recommended at Discharge Frequent or constant Supervision/Assistance  Patient can return home with the following  Help with stairs or ramp for entrance;A little help with bathing/dressing/bathroom   Equipment Recommendations  BSC/3in1       Precautions / Restrictions Precautions Precautions: Fall Restrictions Weight Bearing Restrictions: No       Mobility Bed Mobility Overal bed mobility: Needs Assistance Bed Mobility: Supine to Sit, Sit to Supine     Supine to sit: Min assist Sit to supine: Mod assist        Transfers Overall transfer level: Needs assistance Equipment used: Rolling walker (2 wheels) Transfers: Sit to/from Stand Sit to Stand: Min assist                  Balance Overall balance assessment: Needs assistance Sitting-balance support: Feet supported Sitting balance-Leahy Scale: Good     Standing balance support: Bilateral upper extremity supported, During functional activity, Reliant on assistive device for balance Standing balance-Leahy Scale: Poor                             ADL either performed or assessed with clinical judgement   ADL Overall ADL's : Needs assistance/impaired                         Toilet Transfer: Minimal assistance;Rolling walker (2 wheels);Regular Toilet   Toileting- Clothing Manipulation and Hygiene: Minimal assistance;Sit to/from stand              Extremity/Trunk Assessment Upper Extremity Assessment Upper Extremity Assessment: Overall WFL for tasks assessed   Lower Extremity Assessment Lower Extremity Assessment: Overall WFL for tasks assessed        Vision Patient Visual Report: No change from baseline            Cognition Arousal/Alertness: Awake/alert Behavior During Therapy: Flat affect Overall Cognitive Status: Within Functional Limits for tasks assessed Area of Impairment: Memory, Following commands, Safety/judgement, Problem solving                     Memory: Decreased short-term memory Following Commands: Follows one step commands with increased time Safety/Judgement: Decreased awareness of safety, Decreased awareness of deficits   Problem Solving: Slow  processing, Decreased initiation, Requires verbal cues, Requires tactile cues                     Pertinent Vitals/ Pain       Pain Assessment Pain Assessment: Faces Faces Pain Scale: Hurts a little bit Pain Location: head Pain Descriptors / Indicators: Headache Pain Intervention(s): Monitored during session, Repositioned         Frequency  Min 3X/week        Progress Toward Goals  OT Goals(current goals can now be found in the care plan section)  Progress  towards OT goals: Progressing toward goals     Plan Discharge plan remains appropriate;Frequency remains appropriate       AM-PAC OT "6 Clicks" Daily Activity     Outcome Measure   Help from another person eating meals?: A Little Help from another person taking care of personal grooming?: A Little Help from another person toileting, which includes using toliet, bedpan, or urinal?: A Lot Help from another person bathing (including washing, rinsing, drying)?: A Lot Help from another person to put on and taking off regular upper body clothing?: A Little Help from another person to put on and taking off regular lower body clothing?: A Lot 6 Click Score: 15    End of Session Equipment Utilized During Treatment: Rolling walker (2 wheels)  OT Visit Diagnosis: Other abnormalities of gait and mobility (R26.89);Muscle weakness (generalized) (M62.81)   Activity Tolerance Patient limited by fatigue   Patient Left with call bell/phone within reach;in bed;with bed alarm set   Nurse Communication Mobility status        Time: 1610-9604 OT Time Calculation (min): 31 min  Charges: OT General Charges $OT Visit: 1 Visit OT Treatments $Self Care/Home Management : 8-22 mins $Therapeutic Activity: 8-22 mins  Jackquline Denmark, MS, OTR/L , CBIS ascom [phone]  06/28/22, 4:38 PM

## 2022-06-28 NOTE — Progress Notes (Signed)
Physical Therapy Treatment Patient Details Name: Mitchell Ayers MRN: [id number] DOB: [birth year] Today's Date: 06/28/2022   History of Present Illness Patient is a 82 year old male presenting after hitting his nose resulting in a nose bleed after mechanical fall.  Patient had enlarging subdural hematoma, s/p craniotomy for evacuation of an acute subdural hematoma.    PT Comments    Patient is agreeable to PT. He reports plan to go to rehab before going home. Patient required physical assistance for bed mobility, transfers, and ambulation. Safety cues provided with mobility with standing activity tolerance limited by fatigue. At this time, anticipate intermittent assistance required with continued PT recommended after this hospital stay. PT will continue to follow to maximize independence and decrease caregiver burden.    Recommendations for follow up therapy are one component of a multi-disciplinary discharge planning process, led by the attending physician.  Recommendations may be updated based on patient status, additional functional criteria and insurance authorization.  Follow Up Recommendations  Can patient physically be transported by private vehicle: Yes    Assistance Recommended at Discharge Intermittent Supervision/Assistance  Patient can return home with the following A little help with walking and/or transfers;A little help with bathing/dressing/bathroom;Assistance with cooking/housework;Assist for transportation;Help with stairs or ramp for entrance   Equipment Recommendations  Rolling walker (2 wheels)    Recommendations for Other Services       Precautions / Restrictions Precautions Precautions: Fall Restrictions Weight Bearing Restrictions: No     Mobility  Bed Mobility Overal bed mobility: Needs Assistance Bed Mobility: Supine to Sit, Sit to Supine     Supine to sit: Mod assist Sit to supine: Mod assist   General bed mobility comments: assistance for trunk  support to sit upright. assistance for BLE support to return to bed. cues for task initiation    Transfers Overall transfer level: Needs assistance Equipment used: Rolling walker (2 wheels) Transfers: Sit to/from Stand Sit to Stand: Min assist           General transfer comment: lifting assistance required for standing. cues for eccentric control and hand placement for safety    Ambulation/Gait Ambulation/Gait assistance: Min assist Gait Distance (Feet): 40 Feet Assistive device: Rolling walker (2 wheels) Gait Pattern/deviations: Trunk flexed Gait velocity: decreased     General Gait Details: cues for upright posture. steadying assistance provided with ambulation. activity tolerance is limited by fatigue   Stairs             Wheelchair Mobility    Modified Rankin (Stroke Patients Only)       Balance Overall balance assessment: Needs assistance Sitting-balance support: Feet supported Sitting balance-Leahy Scale: Good     Standing balance support: Bilateral upper extremity supported, During functional activity, Reliant on assistive device for balance Standing balance-Leahy Scale: Poor Standing balance comment: external support required with standing. rolling walker for support also                            Cognition Arousal/Alertness: Awake/alert Behavior During Therapy: Flat affect Overall Cognitive Status: Within Functional Limits for tasks assessed Area of Impairment: Memory, Following commands, Safety/judgement, Problem solving                     Memory: Decreased short-term memory Following Commands: Follows one step commands with increased time Safety/Judgement: Decreased awareness of safety, Decreased awareness of deficits   Problem Solving: Slow processing, Decreased initiation, Requires verbal cues, Requires tactile  cues          Exercises      General Comments        Pertinent Vitals/Pain Pain Assessment Pain  Assessment: No/denies pain    Home Living                          Prior Function            PT Goals (current goals can now be found in the care plan section) Acute Rehab PT Goals Patient Stated Goal: to go to rehab and then home PT Goal Formulation: With patient/family Time For Goal Achievement: 07/09/22 Potential to Achieve Goals: Good Progress towards PT goals: Progressing toward goals    Frequency    Min 3X/week      PT Plan Discharge plan needs to be updated    Co-evaluation              AM-PAC PT "6 Clicks" Mobility   Outcome Measure  Help needed turning from your back to your side while in a flat bed without using bedrails?: A Little Help needed moving from lying on your back to sitting on the side of a flat bed without using bedrails?: A Lot Help needed moving to and from a bed to a chair (including a wheelchair)?: A Little Help needed standing up from a chair using your arms (e.g., wheelchair or bedside chair)?: A Lot Help needed to walk in hospital room?: A Little Help needed climbing 3-5 steps with a railing? : A Lot 6 Click Score: 15    End of Session Equipment Utilized During Treatment: Gait belt Activity Tolerance: Patient tolerated treatment well Patient left: in bed;with call bell/phone within reach;with bed alarm set Nurse Communication: Mobility status PT Visit Diagnosis: Other abnormalities of gait and mobility (R26.89);History of falling (Z91.81);Muscle weakness (generalized) (M62.81);Difficulty in walking, not elsewhere classified (R26.2)     Time: 0454-0981 PT Time Calculation (min) (ACUTE ONLY): 22 min  Charges:  $Therapeutic Activity: 8-22 mins                     Donna Bernard, PT, MPT    Ina Homes 06/28/2022, 11:05 AM

## 2022-06-28 NOTE — Progress Notes (Signed)
   Neurosurgery Progress Note  History: Mitchell Ayers is here for subdural hematoma.  He underwent craniotomy for subdural hematoma evacuation on June 24, 2022.  POD4: Stable, walking with assistance POD3: Pt attempting to ambulate to the bathroom with assistance  POD2: Complains of intermittent headache but overall doing well  POD 1: He is doing much better.  He has minimal headache.  He is much brighter.  Physical Exam: Vitals:   06/28/22 0443 06/28/22 0805  BP: (!) 120/55 (!) 108/56  Pulse: 73 65  Resp: 16 17  Temp: 98.7 F (37.1 C) 98.1 F (36.7 C)  SpO2: 97% 99%    AA Ox3  CNI No drift Strength:5/5 throughout bilateral upper extremities  Data:  Other tests/results: CT scan 06/25/22 reviewed-expected postoperative fluid with resolution of midline shift and improvement in brain compression  Assessment/Plan:  Mitchell Ayers is substantially improved after craniotomy for evacuation of an acute subdural hematoma.  - mobilize - pain control - DVT prophylaxis - PTOT - drain removed 6/19  - dressing removed. Ok to leave incision open to air - Dispo planning pending, likely to SNF  Venetia Night MD Department of Neurosurgery

## 2022-06-29 ENCOUNTER — Inpatient Hospital Stay: Payer: Medicare HMO

## 2022-06-29 DIAGNOSIS — R338 Other retention of urine: Secondary | ICD-10-CM | POA: Diagnosis not present

## 2022-06-29 DIAGNOSIS — S065XAA Traumatic subdural hemorrhage with loss of consciousness status unknown, initial encounter: Secondary | ICD-10-CM | POA: Diagnosis not present

## 2022-06-29 LAB — CBC
HCT: 27.7 % — ABNORMAL LOW (ref 39.0–52.0)
Hemoglobin: 8.9 g/dL — ABNORMAL LOW (ref 13.0–17.0)
MCH: 31.4 pg (ref 26.0–34.0)
MCHC: 32.1 g/dL (ref 30.0–36.0)
MCV: 97.9 fL (ref 80.0–100.0)
Platelets: 241 10*3/uL (ref 150–400)
RBC: 2.83 MIL/uL — ABNORMAL LOW (ref 4.22–5.81)
RDW: 14.8 % (ref 11.5–15.5)
WBC: 8.7 10*3/uL (ref 4.0–10.5)
nRBC: 0 % (ref 0.0–0.2)

## 2022-06-29 LAB — BASIC METABOLIC PANEL
Anion gap: 7 (ref 5–15)
BUN: 23 mg/dL (ref 8–23)
CO2: 25 mmol/L (ref 22–32)
Calcium: 8.5 mg/dL — ABNORMAL LOW (ref 8.9–10.3)
Chloride: 102 mmol/L (ref 98–111)
Creatinine, Ser: 0.99 mg/dL (ref 0.61–1.24)
GFR, Estimated: >60 mL/min (ref >60)
Glucose, Bld: 106 mg/dL — ABNORMAL HIGH (ref 70–99)
Potassium: 4 mmol/L (ref 3.5–5.1)
Sodium: 134 mmol/L — ABNORMAL LOW (ref 135–145)

## 2022-06-29 LAB — GLUCOSE, CAPILLARY: Glucose-Capillary: 124 mg/dL — ABNORMAL HIGH (ref 70–99)

## 2022-06-29 MED ORDER — SODIUM CHLORIDE 0.9 % IV BOLUS: 500.0000 mL | Freq: Once | INTRAVENOUS | Status: DC

## 2022-06-29 MED ORDER — CHLORHEXIDINE GLUCONATE CLOTH 2 % EX PADS
6.0000 | MEDICATED_PAD | Freq: Every day | CUTANEOUS | Status: DC
  Administered 2022-06-29 – 2022-07-02 (×4): 6 via TOPICAL

## 2022-06-29 MED ORDER — DOXAZOSIN MESYLATE 4 MG PO TABS
4.0000 mg | ORAL_TABLET | Freq: Every day | ORAL | Status: DC
  Administered 2022-06-29: 4 mg via ORAL
  Filled 2022-06-29: qty 1

## 2022-06-29 MED ORDER — CLORAZEPATE DIPOTASSIUM 7.5 MG PO TABS
7.5000 mg | ORAL_TABLET | Freq: Every evening | ORAL | Status: DC | PRN
  Administered 2022-06-30: 7.5 mg via ORAL
  Filled 2022-06-29: qty 1

## 2022-06-29 MED ORDER — SODIUM CHLORIDE 0.9 % IV BOLUS
500.0000 mL | Freq: Once | INTRAVENOUS | Status: AC
  Administered 2022-06-29: 500 mL via INTRAVENOUS

## 2022-06-29 MED ORDER — SODIUM CHLORIDE 0.9 % IV BOLUS
1000.0000 mL | Freq: Once | INTRAVENOUS | Status: AC
  Administered 2022-06-29: 1000 mL via INTRAVENOUS

## 2022-06-29 MED ORDER — CLORAZEPATE DIPOTASSIUM 7.5 MG PO TABS: 7.5000 mg | ORAL_TABLET | Freq: Every day | ORAL | Status: DC

## 2022-06-29 NOTE — TOC Progression Note (Signed)
Transition of Care [facility]) - Progression Note    Patient Details  Name: Mitchell Ayers MRN: [id number] Date of Birth: 09-21-1940  Transition of Care [facility]) CM/SW Contact  Garret Reddish, RN Phone Number: 06/29/2022, 5:13 PM  Clinical Narrative:   I have received a call from patient's daughter [name].  She informs me that she has spoken with her father and her family and they do not want to expand the bed search.  They prefer to stay in the [place] area.  They are aware the only bed offer at this time is [facility].   TOC will continue to follow for discharge planning.      Expected Discharge Plan:  (TBD) Barriers to Discharge: Continued Medical Work up  Expected Discharge Plan and Services       Living arrangements for the past 2 months: Single Family Home                                       Social Determinants of Health (SDOH) Interventions SDOH Screenings   Food Insecurity: No Food Insecurity (06/25/2022)  Housing: Low Risk  (06/25/2022)  Transportation Needs: No Transportation Needs (06/25/2022)  Utilities: Not At Risk (06/25/2022)  Tobacco Use: Medium Risk (06/26/2022)    Readmission Risk Interventions     No data to display

## 2022-06-29 NOTE — Progress Notes (Signed)
  Chaplain On-Call responded to Rapid Response notification, and subsequent page by Charge Nurse.  Chaplain received medical update from [employer], who stated the Rapid Response was due to a drop in patient's blood pressure.  Chaplain met patient's wife Britta Mccreedy, and daughter and husband, in the room. They described their relief that fluids are being given to the patient, and that they are hopeful that he can regain strength and energy in order to eat. They stated their gratitude that there has been surgical intervention for the patient's brain injury.  Chaplain heard the family state their strong faith, and provided prayer and much spiritual and emotional support.  Chaplain Evelena Peat M.Div., [facility]

## 2022-06-29 NOTE — TOC Progression Note (Signed)
Transition of Care [facility]) - Progression Note    Patient Details  Name: Mitchell Ayers MRN: [id number] Date of Birth: [birth year]  Transition of Care [facility]) CM/SW Contact  Garret Reddish, RN Phone Number: 06/29/2022, 3:55 PM  Clinical Narrative:    I have spoken to patient's daughter Mitchell Ayers.  She wanted to confirm that [facility] was working on SNF placement for her father.  I have confirmed with Mitchell Ayers that [facility] is working on SNF placement for Mitchell Ayers.  I have informed Mitchell Ayers that may of the facilities in the [place] area has declined offering a bed because they are not in network with the patient's payor.  I have informed her that [facility] is the only facility in [facility] that has offered a bed.  Mitchell Ayers informs me that patient had a bad experience at [facility] last year and his family does not want him to return to that facility.  I have informed her that [facility] in [place] has declined due to not being in network with patient's insurance.  Mitchell Ayers is going to speak with Mitchell Ayers and her sister Mitchell Ayers and inform me if they would like for me to expand the bed search.    TOC will continue to follow for discharge planning.     Expected Discharge Plan:  (TBD) Barriers to Discharge: Continued Medical Work up  Expected Discharge Plan and Services       Living arrangements for the past 2 months: Single Family Home                                       Social Determinants of Health (SDOH) Interventions SDOH Screenings   Food Insecurity: No Food Insecurity (06/25/2022)  Housing: Low Risk  (06/25/2022)  Transportation Needs: No Transportation Needs (06/25/2022)  Utilities: Not At Risk (06/25/2022)  Tobacco Use: Medium Risk (06/26/2022)    Readmission Risk Interventions     No data to display

## 2022-06-29 NOTE — Progress Notes (Addendum)
PROGRESS NOTE  Mitchell Ayers UJW:[id number] DOB: August 08, 1940 DOA: 06/24/2022 PCP: Marlan Palau  Brief History: 82 yo male with hypertension, hyperlipidemia, hypothyroidism, rheumatoid arthritis who presented to [facility] ER on 06/17 via EMS after hitting his nose resulting in a nosebleed following a mechanical fall. Pt ambulates with a walker at baseline and it slipped out from under him.  06/17: admitted for subdural hematoma.  He underwent craniotomy for evacuation.  06/18: doing well post-op, remains in ICU for close monitoring. Participating w/ PT/OT. Repeat CT Head revealed nearly resolved midline shift after right subdural hematoma decompression and no new abnormality.  Neurosurgery ok with transitioning pt to stepdown status  06/19: transfer to hospitalist service. Trops remain elevated.  No regional wall motion abnormalities on 2D echo.  Assessment/Plan:  ADDENDUM Called by RN that patient's BP was low in the 70's. He was lethargic. Significant drop from this AM. Doxazocin is only new medication that was given and likely responsible for this drop in BP. Patient seen by Dr. Eliane Decree since I was offsite. Fluid bolus ordered. Patient responded with increase in BP to 105 systolic and improvement in mentation. Will continue to monitor. Doxazocin stopped. Will hold Lisinopril as well.  3PM: RN reports that patient is not back to his baseline mentation. Will order CT head to rule out changes to his SDH. CBG was ok.  CT head without any new changes. Patient stable per nursing staff. Neurosurgery was notified of the changes.  Subdural hematoma secondary to mechanical fall s/p right craniotomy  Seen by neurosurgery.  Underwent craniotomy.  Followed up by them.  Stable for the most part. Keppra 500 mg orally bid x7 days.  Will complete course on 6/24 Continue PT OT with assistance and fall precautions.  Patient declined SNF.  However he was asked to reconsider his decision.  Acute urinary  retention Has required in and out catheterization 3 times in the last 24 hours.  He is allergic to Flomax.  Will start him on doxazosin.  If he has another episode of retention Foley catheter will need to be placed.  He does mention that he has a history of prostate issues.  He will need urology follow-up in the outpatient setting.  Acute blood loss anemia in the setting of subdural hematoma and craniotomy Hemoglobin has been stable.  Continue to monitor.  Elevated troponin, suspect from brain injury. High-sensitivity troponin peaked at 683 and downtrending to 163. No wall motion abnormalities on 2D echo.  Leukocytosis Suspect reactive in the setting of brain injury and surgery.  Now resolved.   Essential hypertension/hyperlipidemia/mild hyponatremia Blood pressure is currently at goal, normotensive. Continue to monitor vital signs Noted to be on lisinopril.  Also on Lipitor.   Hypothyroidism  Continue synthroid    Rheumatoid arthritis  Continue methotrexate   Ambulatory dysfunction Continue PT OT with assistance and fall precautions.     DVT prophylaxis: Subcu Lovenox daily. Pertinent IV fluids/nutrition: None. Code Status: FULL CODE  TOC needs / Dispo plan: Patient apparently declined SNF.  Plan is for discharge to home with home health.  He was asked to reconsider today in view of his urinary retention. Barriers to discharge / significant pending items: Family is concerned about having unsafe discharge to home with home health services.    Objective: Vitals:   06/28/22 0805 06/28/22 1622 06/29/22 0413 06/29/22 0839  BP: (!) 108/56 115/60 (!) 145/66 (!) 142/75  Pulse: 65 64 72 71  Resp: 17 17 18 16   Temp:  98.1 F (36.7 C) 97.9 F (36.6 C) 99 F (37.2 C) 98.7 F (37.1 C)  TempSrc:      SpO2: 99% 100% 100% 100%  Weight:      Height:        Intake/Output Summary (Last 24 hours) at 06/29/2022 0940 Last data filed at 06/29/2022 0553 Gross per 24 hour  Intake --   Output 1200 ml  Net -1200 ml    Filed Weights   06/25/22 0330 06/26/22 0300 06/28/22 0144  Weight: 66.1 kg 64.9 kg 75.6 kg    Exam:  General appearance: Awake alert.  In no distress Resp: Clear to auscultation bilaterally.  Normal effort Cardio: S1-S2 is normal regular.  No S3-S4.  No rubs murmurs or bruit GI: Abdomen is soft.  Nontender nondistended.  Bowel sounds are present normal.  No masses organomegaly Extremities: No edema.  Full range of motion of lower extremities.  Physical deconditioning noted. Neurologic: Alert and oriented x3.  No focal neurological deficits.     Data Reviewed: CBC: Recent Labs  Lab 06/24/22 0546 06/25/22 0442 06/27/22 0536 06/28/22 0637  WBC 12.6* 17.5* 12.4* 10.2  NEUTROABS 9.6* 15.8*  --   --   HGB 11.4* 10.3* 8.6* 9.2*  HCT 36.7* 32.9* 28.0* 28.8*  MCV 99.7 99.1 100.4* 97.3  PLT 258 243 209 228    Basic Metabolic Panel: Recent Labs  Lab 06/25/22 0442 06/26/22 0411 06/27/22 0536 06/28/22 0637 06/29/22 0537  NA 137 139 137 132* 134*  K 4.2 4.0 4.1 4.1 4.0  CL 105 107 104 100 102  CO2 24 26 28 25 25   GLUCOSE 149* 126* 115* 124* 106*  BUN 24* 27* 30* 28* 23  CREATININE 1.41* 1.04 1.11 1.09 0.99  CALCIUM 8.2* 8.3* 8.4* 8.6* 8.5*    GFR: Estimated Creatinine Clearance: 51 mL/min (by C-G formula based on SCr of 0.99 mg/dL).  Recent Labs  Lab 06/24/22 0546  INR 1.0     CBG: Recent Labs  Lab 06/24/22 1725  GLUCAP 147*     Recent Results (from the past 240 hour(s))  MRSA Next Gen by PCR, Nasal     Status: None   Collection Time: 06/24/22  7:30 PM   Specimen: Nasal Mucosa; Nasal Swab  Result Value Ref Range Status   MRSA by PCR Next Gen NOT DETECTED NOT DETECTED Final    Comment: (NOTE) The GeneXpert MRSA Assay (FDA approved for NASAL specimens only), is one component of a comprehensive MRSA colonization surveillance program. It is not intended to diagnose MRSA infection nor to guide or monitor treatment for  MRSA infections. Test performance is not FDA approved in patients less than 16 years old. Performed at [facility], [address] [place], [place] [address]       Studies: No results found.  Scheduled Meds:  atorvastatin  20 mg Oral Daily   azelastine  1 spray Each Nare BID   clorazepate  7.5 mg Oral TID   doxazosin  4 mg Oral Daily   enoxaparin (LOVENOX) injection  40 mg Subcutaneous Daily   feeding supplement  237 mL Oral TID BM   folic acid  2 mg Oral Daily   gabapentin  100 mg Oral BID   levETIRAcetam  500 mg Oral BID   levothyroxine  100 mcg Oral Daily   lisinopril  5 mg Oral BID   [START ON 06/30/2022] methotrexate  12.5 mg Oral Weekly   multivitamin with minerals  1 tablet Oral Daily  pantoprazole  40 mg Oral q morning    Continuous Infusions:   LOS: 5 days     Osvaldo Shipper, MD [facility]   If 7PM-7AM, please contact night-coverage [email or web address] Password [facility] 06/29/2022, 9:40 AM

## 2022-06-29 NOTE — Progress Notes (Signed)
   Neurosurgery Progress Note  History: Mitchell Ayers is here for subdural hematoma.  He underwent craniotomy for subdural hematoma evacuation on June 24, 2022.  POD5: Stable, no headache.  Motivated to get up with assistance POD4: Stable, walking with assistance POD3: Pt attempting to ambulate to the bathroom with assistance  POD2: Complains of intermittent headache but overall doing well  POD 1: He is doing much better.  He has minimal headache.  He is much brighter.  Physical Exam: Vitals:   06/29/22 0413 06/29/22 0839  BP: (!) 145/66 (!) 142/75  Pulse: 72 71  Resp: 18 16  Temp: 99 F (37.2 C) 98.7 F (37.1 C)  SpO2: 100% 100%    AA Ox3  CNI No drift Strength:5/5 throughout bilateral upper extremities  Data:  Other tests/results: CT scan 06/25/22 reviewed-expected postoperative fluid with resolution of midline shift and improvement in brain compression  Assessment/Plan:  Mitchell Ayers is substantially improved after craniotomy for evacuation of an acute subdural hematoma.  - mobilize, patient expresses motivation  to get up with therapy today. - pain control - DVT prophylaxis - PTOT - drain removed 6/19  - dressing removed. Ok to leave incision open to air - Dispo planning pending, likely to SNF  Lovenia Kim, MD/MSCR Department of Neurosurgery

## 2022-06-29 NOTE — Progress Notes (Signed)
Dr Adrian Prince made aware per Dr Eliane Decree request that a rapid response was called on this pt around 1:15, he became unresponsive even to sternal rub, BP 77/44, Dr Rito Ehrlich and Dr Allena Katz following, bolus given BP did improve 109/57 after bolus, pt responsiveness improved some is able to carry on a conversation now, Dr Rito Ehrlich ordered a STAT head CT

## 2022-06-29 NOTE — Significant Event (Addendum)
Rapid Response Event Note   Reason for Call : called for lethargy, low BP   Initial Focused Assessment: laying in bed, previously oob to chair, letharic but arousable to name, SBP's reported in 60's and 70's      Interventions: Dr Enedina Finner to bedside, new iv and 1 liter NS bolus    Plan of Care: as above, RN Richrd Humbles, and charge RN Marchelle Folks to call with further concerns    Event Summary: as above  MD Notified: Enedina Finner, MD Call [phone] Arrival Time:1326 End [id number]  Mitchell Ayers A, RN

## 2022-06-29 NOTE — Care Management (Signed)
Cross cover note  Rapid response called for lethargy Pt doing well per RN, was in the chair, had eaten BF and talking with family. Flt weak. BP down in the 70's Responds to verbal command. Giving IVF fluid bolus D/c Doxazocin for now  D/w family

## 2022-06-30 DIAGNOSIS — S065X9A Traumatic subdural hemorrhage with loss of consciousness of unspecified duration, initial encounter: Secondary | ICD-10-CM | POA: Diagnosis not present

## 2022-06-30 DIAGNOSIS — S065XAA Traumatic subdural hemorrhage with loss of consciousness status unknown, initial encounter: Secondary | ICD-10-CM | POA: Diagnosis not present

## 2022-06-30 DIAGNOSIS — R338 Other retention of urine: Secondary | ICD-10-CM | POA: Diagnosis not present

## 2022-06-30 DIAGNOSIS — G514 Facial myokymia: Secondary | ICD-10-CM | POA: Diagnosis not present

## 2022-06-30 LAB — CBC
HCT: 24.9 % — ABNORMAL LOW (ref 39.0–52.0)
Hemoglobin: 7.9 g/dL — ABNORMAL LOW (ref 13.0–17.0)
MCH: 31.2 pg (ref 26.0–34.0)
MCHC: 31.7 g/dL (ref 30.0–36.0)
MCV: 98.4 fL (ref 80.0–100.0)
Platelets: 235 10*3/uL (ref 150–400)
RBC: 2.53 MIL/uL — ABNORMAL LOW (ref 4.22–5.81)
RDW: 14.8 % (ref 11.5–15.5)
WBC: 8.3 10*3/uL (ref 4.0–10.5)
nRBC: 0 % (ref 0.0–0.2)

## 2022-06-30 LAB — BASIC METABOLIC PANEL
Anion gap: 4 — ABNORMAL LOW (ref 5–15)
BUN: 27 mg/dL — ABNORMAL HIGH (ref 8–23)
CO2: 24 mmol/L (ref 22–32)
Calcium: 8.1 mg/dL — ABNORMAL LOW (ref 8.9–10.3)
Chloride: 105 mmol/L (ref 98–111)
Creatinine, Ser: 1.06 mg/dL (ref 0.61–1.24)
GFR, Estimated: >60 mL/min (ref >60)
Glucose, Bld: 107 mg/dL — ABNORMAL HIGH (ref 70–99)
Potassium: 4.2 mmol/L (ref 3.5–5.1)
Sodium: 133 mmol/L — ABNORMAL LOW (ref 135–145)

## 2022-06-30 MED ORDER — POLYETHYLENE GLYCOL 3350 17 G PO PACK
17.0000 g | PACK | Freq: Every day | ORAL | Status: DC
  Administered 2022-06-30 – 2022-07-01 (×2): 17 g via ORAL
  Filled 2022-06-30 (×2): qty 1

## 2022-06-30 MED ORDER — SENNOSIDES-DOCUSATE SODIUM 8.6-50 MG PO TABS
2.0000 | ORAL_TABLET | Freq: Two times a day (BID) | ORAL | Status: DC
  Administered 2022-06-30 – 2022-07-02 (×4): 2 via ORAL
  Filled 2022-06-30 (×4): qty 2

## 2022-06-30 NOTE — Progress Notes (Signed)
PROGRESS NOTE  Mitchell Ayers ZOX:[id number] DOB: 20-Jan-1940 DOA: 06/24/2022 PCP: Marlan Palau  Brief History: 82 yo male with hypertension, hyperlipidemia, hypothyroidism, rheumatoid arthritis who presented to [facility] ER on 06/17 via EMS after hitting his nose resulting in a nosebleed following a mechanical fall. Pt ambulates with a walker at baseline and it slipped out from under him.  06/17: admitted for subdural hematoma.  He underwent craniotomy for evacuation.  06/18: doing well post-op, remains in ICU for close monitoring. Participating w/ PT/OT. Repeat CT Head revealed nearly resolved midline shift after right subdural hematoma decompression and no new abnormality.  Neurosurgery ok with transitioning pt to stepdown status  06/19: transfer to hospitalist service. Trops remain elevated.  No regional wall motion abnormalities on 2D echo.  Assessment/Plan:  Hypotension Patient experienced a significant drop in his blood pressure yesterday.  He became unresponsive for a brief while.  Likely triggered by doxazosin.  No other reason found for hypotension.  He did not have fever.  No overt bleeding noted.  CT head also showed stable findings. Doxazosin and lisinopril discontinued.  He was given fluid boluses with improvement in blood pressure and mentation.  Seems to be getting close to his baseline.  No focal neurological deficits noted.  Will continue to monitor.  Subdural hematoma secondary to mechanical fall s/p right craniotomy  Seen by neurosurgery.  Underwent craniotomy.  Followed up by them.  Stable for the most part. Keppra 500 mg orally bid x7 days.  Will complete course on 6/24 Continue PT OT with assistance and fall precautions.  Patient declined SNF.  However he was asked to reconsider his decision. CT head was repeated on 6/22 due to altered mental status and did not show any new findings.  Neurosurgery was informed.  Acute urinary retention Foley catheter had to be placed after  multiple In-N-Out catheterization.  He is allergic to Flomax and experienced a significant drop in blood pressure to doxazosin.  He will need outpatient follow-up with urology.  He will be discharged with a Foley catheter.  Acute blood loss anemia in the setting of subdural hematoma and craniotomy Some drop in hemoglobin is likely due to dilution from IV fluids given yesterday.  No overt bleeding noted.  Recheck labs tomorrow.    Elevated troponin, suspect from brain injury. High-sensitivity troponin peaked at 683 and downtrending to 163. No wall motion abnormalities on 2D echo.  Leukocytosis Suspect reactive in the setting of brain injury and surgery.  Now resolved.   Essential hypertension/hyperlipidemia/mild hyponatremia See above regarding hypertension. Continue with statin. Sodium level stable.   Hypothyroidism  Continue synthroid    Rheumatoid arthritis  Continue methotrexate   Ambulatory dysfunction Continue PT OT with assistance and fall precautions.     DVT prophylaxis: Subcu Lovenox daily. Pertinent IV fluids/nutrition: None. Code Status: FULL CODE  TOC needs / Dispo plan: Patient is now agreeable to SNF.  Social worker is following..    Objective: Vitals:   06/29/22 2138 06/30/22 0500 06/30/22 0606 06/30/22 0756  BP: (!) 96/46  107/60 (!) 109/57  Pulse: 86  68 65  Resp: 16  18 16   Temp: 98.7 F (37.1 C)  97.6 F (36.4 C) 97.9 F (36.6 C)  TempSrc: Oral  Oral   SpO2: 100%  99% 100%  Weight:  76 kg    Height:        Intake/Output Summary (Last 24 hours) at 06/30/2022 1030 Last data filed at 06/30/2022 0700 Gross per 24 hour  Intake 1940.29 ml  Output 700 ml  Net 1240.29 ml    Filed Weights   06/26/22 0300 06/28/22 0144 06/30/22 0500  Weight: 64.9 kg 75.6 kg 76 kg    Exam:  General appearance: Awake alert.  In no distress Resp: Clear to auscultation bilaterally.  Normal effort Cardio: S1-S2 is normal regular.  No S3-S4.  No rubs murmurs or  bruit GI: Abdomen is soft.  Nontender nondistended.  Bowel sounds are present normal.  No masses organomegaly Extremities: No edema.  Physical deconditioning noted. No obvious focal neurological deficits.    Data Reviewed: CBC: Recent Labs  Lab 06/24/22 0546 06/25/22 0442 06/27/22 0536 06/28/22 0637 06/29/22 1622 06/30/22 0513  WBC 12.6* 17.5* 12.4* 10.2 8.7 8.3  NEUTROABS 9.6* 15.8*  --   --   --   --   HGB 11.4* 10.3* 8.6* 9.2* 8.9* 7.9*  HCT 36.7* 32.9* 28.0* 28.8* 27.7* 24.9*  MCV 99.7 99.1 100.4* 97.3 97.9 98.4  PLT 258 243 209 228 241 235    Basic Metabolic Panel: Recent Labs  Lab 06/26/22 0411 06/27/22 0536 06/28/22 0637 06/29/22 0537 06/30/22 0513  NA 139 137 132* 134* 133*  K 4.0 4.1 4.1 4.0 4.2  CL 107 104 100 102 105  CO2 26 28 25 25 24   GLUCOSE 126* 115* 124* 106* 107*  BUN 27* 30* 28* 23 27*  CREATININE 1.04 1.11 1.09 0.99 1.06  CALCIUM 8.3* 8.4* 8.6* 8.5* 8.1*    GFR: Estimated Creatinine Clearance: 47.6 mL/min (by C-G formula based on SCr of 1.06 mg/dL).  Recent Labs  Lab 06/24/22 0546  INR 1.0     CBG: Recent Labs  Lab 06/24/22 1725 06/29/22 1323  GLUCAP 147* 124*     Recent Results (from the past 240 hour(s))  MRSA Next Gen by PCR, Nasal     Status: None   Collection Time: 06/24/22  7:30 PM   Specimen: Nasal Mucosa; Nasal Swab  Result Value Ref Range Status   MRSA by PCR Next Gen NOT DETECTED NOT DETECTED Final    Comment: (NOTE) The GeneXpert MRSA Assay (FDA approved for NASAL specimens only), is one component of a comprehensive MRSA colonization surveillance program. It is not intended to diagnose MRSA infection nor to guide or monitor treatment for MRSA infections. Test performance is not FDA approved in patients less than 57 years old. Performed at [facility], [address] [place], [place] [address]       Studies: CT HEAD WO CONTRAST ( )  Result Date: 06/29/2022 CLINICAL DATA:  Altered level of  consciousness, history of subdural hematoma status post decompression EXAM: CT HEAD WITHOUT CONTRAST TECHNIQUE: Contiguous axial images were obtained from the base of the skull through the vertex without intravenous contrast. RADIATION DOSE REDUCTION: This exam was performed according to the departmental dose-optimization program which includes automated exposure control, adjustment of the mA and/or kV according to patient size and/or use of iterative reconstruction technique. COMPARISON:  06/25/2022 FINDINGS: Brain: Postsurgical changes are again seen from right frontotemporal craniotomy. Decreased pneumocephalus since prior exam. Persistent residual right-sided subdural hematoma measuring up to 1.1 cm, not significantly changed since prior exam. Blood products extend along the right side tentorium, unchanged. There are no new areas of intracranial hemorrhage. No signs of acute infarct. Persistent mild leftward midline shift measuring up to 4 mm. Lateral ventricles and midline structures are stable. Vascular: No hyperdense vessel or unexpected calcification. Skull: Postsurgical changes from right frontotemporal craniotomy. No acute bony abnormalities.  Sinuses/Orbits: Stable postsurgical changes.  No acute process. Other: None. IMPRESSION: 1. Postsurgical changes from right frontotemporal craniotomy and underlying subdural hematoma evacuation. Decreased pneumocephalus since prior exam. 2. Stable residual right sided subdural hematoma, with no significant change in the minimal mass effect and leftward midline shift as above. 3. No new areas of infarct or hemorrhage. Electronically Signed   By: Sharlet Salina M.D.   On: 06/29/2022 15:57    Scheduled Meds:  atorvastatin  20 mg Oral Daily   azelastine  1 spray Each Nare BID   Chlorhexidine Gluconate Cloth  6 each Topical Daily   enoxaparin (LOVENOX) injection  40 mg Subcutaneous Daily   feeding supplement  237 mL Oral TID BM   folic acid  2 mg Oral Daily    gabapentin  100 mg Oral BID   levETIRAcetam  500 mg Oral BID   levothyroxine  100 mcg Oral Daily   methotrexate  12.5 mg Oral Weekly   multivitamin with minerals  1 tablet Oral Daily   pantoprazole  40 mg Oral q morning   polyethylene glycol  17 g Oral Daily   senna-docusate  2 tablet Oral BID    Continuous Infusions:   LOS: 6 days     Osvaldo Shipper, MD [facility]   If 7PM-7AM, please contact night-coverage [email or web address] Password [email or web address] 06/30/2022, 10:30 AM

## 2022-06-30 NOTE — Progress Notes (Signed)
PT Cancellation Note  Patient Details Name: Mitchell Ayers MRN: [id number] DOB: 1940-11-11   Cancelled Treatment:    Reason Eval/Treat Not Completed: Other (comment).  Nurse reports pt recently walked with staff assist; pt declining therapy at this time d/t fatigue from recent walk.  Will re-attempt PT session at a later date/time.  Hendricks Limes, PT 06/30/22, 3:58 PM

## 2022-06-30 NOTE — Progress Notes (Signed)
Neurosurgery Progress Note  History: Mitchell Ayers is here for subdural hematoma.  He underwent craniotomy for subdural hematoma evacuation on June 24, 2022.  POD6: Stable, no headache.  Yesterday had a episode of decreased mental status in the setting of a hypotensive episode.  He was given fluid and recovered slowly.  He is back to his baseline today.  Has been up and walking with physical therapy. POD5: Stable, no headache.  Motivated to get up with assistance POD4: Stable, walking with assistance POD3: Pt attempting to ambulate to the bathroom with assistance  POD2: Complains of intermittent headache but overall doing well  POD 1: He is doing much better.  He has minimal headache.  He is much brighter.  Physical Exam: Vitals:   06/30/22 0606 06/30/22 0756  BP: 107/60 (!) 109/57  Pulse: 68 65  Resp: 18 16  Temp: 97.6 F (36.4 C) 97.9 F (36.6 C)  SpO2: 99% 100%    AA Ox3  CNI No drift Strength:5/5 throughout bilateral upper extremities Incision is clean dry and intact with no evidence of wound breakdown  Data:  Other tests/results: CT scan 06/25/22 reviewed-expected postoperative fluid with resolution of midline shift and improvement in brain compression  Narrative & Impression  CLINICAL DATA:  Altered level of consciousness, history of subdural hematoma status post decompression   EXAM: CT HEAD WITHOUT CONTRAST   TECHNIQUE: Contiguous axial images were obtained from the base of the skull through the vertex without intravenous contrast.   RADIATION DOSE REDUCTION: This exam was performed according to the departmental dose-optimization program which includes automated exposure control, adjustment of the mA and/or kV according to patient size and/or use of iterative reconstruction technique.   COMPARISON:  06/25/2022   FINDINGS: Brain: Postsurgical changes are again seen from right frontotemporal craniotomy. Decreased pneumocephalus since prior exam.  Persistent residual right-sided subdural hematoma measuring up to 1.1 cm, not significantly changed since prior exam. Blood products extend along the right side tentorium, unchanged.   There are no new areas of intracranial hemorrhage. No signs of acute infarct. Persistent mild leftward midline shift measuring up to 4 mm. Lateral ventricles and midline structures are stable.   Vascular: No hyperdense vessel or unexpected calcification.   Skull: Postsurgical changes from right frontotemporal craniotomy. No acute bony abnormalities.   Sinuses/Orbits: Stable postsurgical changes.  No acute process.   Other: None.   IMPRESSION: 1. Postsurgical changes from right frontotemporal craniotomy and underlying subdural hematoma evacuation. Decreased pneumocephalus since prior exam. 2. Stable residual right sided subdural hematoma, with no significant change in the minimal mass effect and leftward midline shift as above. 3. No new areas of infarct or hemorrhage.     Electronically Signed   By: Sharlet Salina M.D.   On: 06/29/2022 15:57       Assessment/Plan:  Mitchell Ayers is substantially improved after craniotomy for evacuation of an acute subdural hematoma.  Has been steadily recovering, had 1 episode on postoperative day 5 where he had a episode of hypotension and had decreased level of mentation.  Repeat head CT at that time was stable.  He improved to baseline with fluids.  -Patient mobilize today.  Doing well.  Back to his baseline.  Recovered from his episode yesterday. -Repeat CT was evaluated, no significant changes, no plan for further inpatient scheduled imaging. - pain control - DVT prophylaxis - PTOT - drain removed 6/19  - dressing removed. Ok to leave incision open to air - Dispo planning pending, likely to  SNF  Lovenia Kim, MD/MSCR Department of Neurosurgery

## 2022-07-01 ENCOUNTER — Ambulatory Visit: Payer: Medicare HMO

## 2022-07-01 DIAGNOSIS — G514 Facial myokymia: Secondary | ICD-10-CM

## 2022-07-01 DIAGNOSIS — D649 Anemia, unspecified: Secondary | ICD-10-CM | POA: Diagnosis not present

## 2022-07-01 DIAGNOSIS — R569 Unspecified convulsions: Secondary | ICD-10-CM

## 2022-07-01 DIAGNOSIS — S065XAA Traumatic subdural hemorrhage with loss of consciousness status unknown, initial encounter: Secondary | ICD-10-CM | POA: Diagnosis not present

## 2022-07-01 DIAGNOSIS — R338 Other retention of urine: Secondary | ICD-10-CM | POA: Diagnosis not present

## 2022-07-01 LAB — BASIC METABOLIC PANEL
Anion gap: 6 (ref 5–15)
BUN: 27 mg/dL — ABNORMAL HIGH (ref 8–23)
CO2: 24 mmol/L (ref 22–32)
Calcium: 8.3 mg/dL — ABNORMAL LOW (ref 8.9–10.3)
Chloride: 104 mmol/L (ref 98–111)
Creatinine, Ser: 1.08 mg/dL (ref 0.61–1.24)
GFR, Estimated: >60 mL/min (ref >60)
Glucose, Bld: 100 mg/dL — ABNORMAL HIGH (ref 70–99)
Potassium: 4 mmol/L (ref 3.5–5.1)
Sodium: 134 mmol/L — ABNORMAL LOW (ref 135–145)

## 2022-07-01 LAB — CBC
HCT: 26.1 % — ABNORMAL LOW (ref 39.0–52.0)
Hemoglobin: 8.2 g/dL — ABNORMAL LOW (ref 13.0–17.0)
MCH: 30.7 pg (ref 26.0–34.0)
MCHC: 31.4 g/dL (ref 30.0–36.0)
MCV: 97.8 fL (ref 80.0–100.0)
Platelets: 276 10*3/uL (ref 150–400)
RBC: 2.67 MIL/uL — ABNORMAL LOW (ref 4.22–5.81)
RDW: 14.6 % (ref 11.5–15.5)
WBC: 10.2 10*3/uL (ref 4.0–10.5)
nRBC: 0 % (ref 0.0–0.2)

## 2022-07-01 MED ORDER — POLYETHYLENE GLYCOL 3350 17 G PO PACK
17.0000 g | PACK | Freq: Two times a day (BID) | ORAL | Status: DC
  Administered 2022-07-02: 17 g via ORAL
  Filled 2022-07-01: qty 1

## 2022-07-01 MED ORDER — BISACODYL 10 MG RE SUPP
10.0000 mg | Freq: Once | RECTAL | Status: DC
  Filled 2022-07-01: qty 1

## 2022-07-01 MED ORDER — LEVETIRACETAM 500 MG PO TABS
500.0000 mg | ORAL_TABLET | Freq: Two times a day (BID) | ORAL | Status: DC
  Administered 2022-07-01 – 2022-07-02 (×2): 500 mg via ORAL
  Filled 2022-07-01 (×2): qty 1

## 2022-07-01 MED ORDER — FLEET ENEMA 7-19 GM/118ML RE ENEM: 1.0000 | ENEMA | Freq: Every day | RECTAL | Status: DC | PRN

## 2022-07-01 NOTE — TOC Progression Note (Signed)
Transition of Care [facility]) - Progression Note    Patient Details  Name: Mitchell Ayers MRN: [id number] Date of Birth: 30-Nov-1940  [facility]) CM/SW Contact  Allena Katz, LCSW Phone Number: 07/01/2022, 1:43 PM  Clinical Narrative:   Daughter reports she would like pt to go to [facility] in [place]. Message sent to [facility] to see if they are able to accept pt.     Expected Discharge Plan:  (TBD) Barriers to Discharge: Continued Medical Work up  Expected Discharge Plan and Services       Living arrangements for the past 2 months: Single Family Home                                       Social Determinants of Health (SDOH) Interventions SDOH Screenings   Food Insecurity: No Food Insecurity (06/25/2022)  Housing: Low Risk  (06/25/2022)  Transportation Needs: No Transportation Needs (06/25/2022)  Utilities: Not At Risk (06/25/2022)  Tobacco Use: Medium Risk (06/26/2022)    Readmission Risk Interventions     No data to display

## 2022-07-01 NOTE — Care Management Important Message (Signed)
Important Message  Patient Details  Name: Mitchell Ayers MRN: [id number] Date of Birth: 09-02-1940   Medicare Important Message Given:  Yes     [name] A Riely Baskett 07/01/2022, 2:10 PM

## 2022-07-01 NOTE — TOC Progression Note (Signed)
Transition of Care [facility]) - Progression Note    Patient Details  Name: Eric Nees MRN: [id number] Date of Birth: Jul 01, 1940  Transition of Care [facility]) CM/SW Contact  Allena Katz, LCSW Phone Number: 07/01/2022, 3:48 PM  Clinical Narrative:    [facility] approved for [facility].  Approved 6/25 - 7/7. Cert: [id number]   Expected Discharge Plan:  (TBD) Barriers to Discharge: Continued Medical Work up  Expected Discharge Plan and Services       Living arrangements for the past 2 months: Single Family Home                                       Social Determinants of Health (SDOH) Interventions SDOH Screenings   Food Insecurity: No Food Insecurity (06/25/2022)  Housing: Low Risk  (06/25/2022)  Transportation Needs: No Transportation Needs (06/25/2022)  Utilities: Not At Risk (06/25/2022)  Tobacco Use: Medium Risk (06/26/2022)    Readmission Risk Interventions     No data to display

## 2022-07-01 NOTE — Procedures (Signed)
Patient Name: Mitchell Ayers  MRN: [id number]  Epilepsy Attending: Charlsie Quest  Referring Physician/Provider: Osvaldo Shipper, MD  Date: 07/01/2022 Duration: 30.56 mins  Patient history: 82 yo man with hx HTN, HL, hypothyroidism, RA admitted for traumatic SDH after mechannical fall s/p craniotomy for evacuation. Yesterday he had an episode of R facial twitching. EEG to evaluate for seizure.   Level of alertness: Awake, asleep  AEDs during EEG study: LEV, GBP, Tranxene  Technical aspects: This EEG study was done with scalp electrodes positioned according to the 10-20 International system of electrode placement. Electrical activity was reviewed with band pass filter of 1-70Hz , sensitivity of 7 uV/mm, display speed of 52mm/sec with a 60Hz  notched filter applied as appropriate. EEG data were recorded continuously and digitally stored.  Video monitoring was available and reviewed as appropriate.  Description: The posterior dominant rhythm consists of 8-9 Hz activity of moderate voltage (25-35 uV) seen predominantly in posterior head regions, symmetric and reactive to eye opening and eye closing. Sleep was characterized by vertex waves, sleep spindles (12 to 14 Hz), maximal frontocentral region. There is an excessive amount of 15 to 18 Hz beta activity distributed symmetrically and diffusely. EEG showed continuous 3 to 6 Hz theta-delta slowing in right temporal region. Hyperventilation and photic stimulation were not performed.     ABNORMALITY - Continuous slow, right temporal region - Excessive beta, generalized  IMPRESSION: This study is suggestive of cortical dysfunction arising from right temporal region likely secondary to underlying structural abnormality. The excessive beta activity seen in the background is most likely due to the effect of benzodiazepine and is a benign EEG pattern.  No seizures or epileptiform discharges were seen throughout the recording.  Kiernan Farkas Annabelle Harman

## 2022-07-01 NOTE — Progress Notes (Signed)
Physical Therapy Treatment Patient Details Name: Mitchell Ayers MRN: [id number] DOB: 1940/09/17 Today's Date: 07/01/2022   History of Present Illness Patient is a 82 year old male presenting after hitting his nose resulting in a nose bleed after mechanical fall.  Patient had enlarging subdural hematoma, s/p craniotomy for evacuation of an acute subdural hematoma.    PT Comments    Patient laying in bed upon arrival, states he's nervous about needing to go to the bathroom due to miralax use. Nursing came in during session to administer medications. Patient Aox4 and remembers SPT from session last Thursday. Performed supine there-ex; heel slides x 5 b/l, SLR x 10 b/l, and ankle pumps x 10 b/l. Min assist supine<>sit EOB, no complaints of dizziness sitting EOB for medications. Min assist sit<>stand with RW, no cueing needed for safety. Min assist for ambulation, 40ft to recliner. Due to transport arriving patient returned to bed with min assist after 5 minutes of being in chair. Patient left in bed with alarm set, bell in reach and hospital staff in room. Communicated with mobility specialist about walking with the patient later today.     Recommendations for follow up therapy are one component of a multi-disciplinary discharge planning process, led by the attending physician.  Recommendations may be updated based on patient status, additional functional criteria and insurance authorization.  Follow Up Recommendations  Can patient physically be transported by private vehicle: Yes    Assistance Recommended at Discharge Intermittent Supervision/Assistance  Patient can return home with the following A little help with walking and/or transfers;A little help with bathing/dressing/bathroom;Assistance with cooking/housework;Assist for transportation;Help with stairs or ramp for entrance   Equipment Recommendations  Rolling walker (2 wheels)    Recommendations for Other Services       Precautions /  Restrictions Precautions Precautions: Fall Restrictions Weight Bearing Restrictions: No     Mobility  Bed Mobility Overal bed mobility: Needs Assistance Bed Mobility: Supine to Sit, Sit to Supine     Supine to sit: Min assist Sit to supine: Min assist   General bed mobility comments: assistance with verbal cueing for sequencing during mobility. Min assist for scooting to EOB. Min assist for straightening up in supine.    Transfers Overall transfer level: Needs assistance Equipment used: Rolling walker (2 wheels) Transfers: Sit to/from Stand Sit to Stand: Min assist           General transfer comment: lifting assistance for ful hip extension in standing, able to maintain once in standing. Demonstrated proper hand placement during transfer with no cueing.    Ambulation/Gait Ambulation/Gait assistance: Min assist Gait Distance (Feet): 3 Feet Assistive device: Rolling walker (2 wheels) Gait Pattern/deviations: Trunk flexed       General Gait Details: Cueing needed for posture and to avoid looking at his feet. No reported dizziness with standing or movement.   Stairs             Wheelchair Mobility    Modified Rankin (Stroke Patients Only)       Balance Overall balance assessment: Needs assistance Sitting-balance support: Feet supported, Bilateral upper extremity supported Sitting balance-Leahy Scale: Good Sitting balance - Comments: forward leaning in sitting   Standing balance support: Bilateral upper extremity supported, During functional activity, Reliant on assistive device for balance Standing balance-Leahy Scale: Fair Standing balance comment: RW required to maintain balance in standing.  Cognition Arousal/Alertness: Awake/alert Behavior During Therapy: Flat affect Overall Cognitive Status: Within Functional Limits for tasks assessed Area of Impairment: Attention                   Current  Attention Level: Divided           General Comments: Patient remembered SPT from last weeks session. Aware of foley catheter placement and safety surrounding it. demonstrated correct hand positioning and safety with RW.        Exercises General Exercises - Lower Extremity Ankle Circles/Pumps: AROM, Both, 10 reps, Supine Heel Slides: AROM, Both, 5 reps, Supine Straight Leg Raises: AROM, Both, 10 reps, Supine    General Comments        Pertinent Vitals/Pain Pain Assessment Pain Assessment: 0-10 Pain Score: 3  Pain Location: head Pain Descriptors / Indicators: Headache Pain Intervention(s): Limited activity within patient's tolerance, Monitored during session, RN gave pain meds during session    Home Living                          Prior Function            PT Goals (current goals can now be found in the care plan section) Acute Rehab PT Goals PT Goal Formulation: With patient/family Time For Goal Achievement: 07/09/22 Potential to Achieve Goals: Good Progress towards PT goals: Progressing toward goals    Frequency    Min 3X/week      PT Plan Current plan remains appropriate    Co-evaluation              AM-PAC PT "6 Clicks" Mobility   Outcome Measure  Help needed turning from your back to your side while in a flat bed without using bedrails?: A Little Help needed moving from lying on your back to sitting on the side of a flat bed without using bedrails?: A Little Help needed moving to and from a bed to a chair (including a wheelchair)?: A Little Help needed standing up from a chair using your arms (e.g., wheelchair or bedside chair)?: A Little Help needed to walk in hospital room?: A Little Help needed climbing 3-5 steps with a railing? : A Lot 6 Click Score: 17    End of Session Equipment Utilized During Treatment: Gait belt Activity Tolerance: Patient tolerated treatment well Patient left: in bed;with call bell/phone within  reach;with bed alarm set;with nursing/sitter in room Nurse Communication: Mobility status PT Visit Diagnosis: Other abnormalities of gait and mobility (R26.89);History of falling (Z91.81);Muscle weakness (generalized) (M62.81);Difficulty in walking, not elsewhere classified (R26.2)     Time: 5409 [phone] (950) PT Time Calculation (min) (ACUTE ONLY): 36 min  Charges:                        Malachi Carl, SPT   Malachi Carl 07/01/2022, 10:42 AM

## 2022-07-01 NOTE — Progress Notes (Signed)
Occupational Therapy Treatment Patient Details Name: Mitchell Ayers MRN: [id number] DOB: 09-26-1940 Today's Date: 07/01/2022   History of present illness Patient is a 82 year old male presenting after hitting his nose resulting in a nose bleed after mechanical fall.  Patient had enlarging subdural hematoma, s/p craniotomy for evacuation of an acute subdural hematoma.   OT comments  Upon entering the room, pt seated in recliner chair and requesting to ambulate with therapist. Pt declined self care tasks this session. Min guard to stand from recliner chair with cuing for technique and hand placement. Pt ambulates 200' out of room and into hallway with min guard and use of RW. Pt needing cuing for upright positioning and forward head but reports feeling well. He does request to return to bed after session and needs min A for sit >supine for trunk support. Call bell and all needed items within reach and progressing towards goals.    Recommendations for follow up therapy are one component of a multi-disciplinary discharge planning process, led by the attending physician.  Recommendations may be updated based on patient status, additional functional criteria and insurance authorization.    Assistance Recommended at Discharge Frequent or constant Supervision/Assistance  Patient can return home with the following  Help with stairs or ramp for entrance;A little help with bathing/dressing/bathroom   Equipment Recommendations  BSC/3in1       Precautions / Restrictions Precautions Precautions: Fall       Mobility Bed Mobility Overal bed mobility: Needs Assistance Bed Mobility: Sit to Supine       Sit to supine: Min assist   General bed mobility comments: min A for trunk support to return to supine    Transfers Overall transfer level: Needs assistance Equipment used: Rolling walker (2 wheels) Transfers: Sit to/from Stand Sit to Stand: Min guard                 Balance Overall  balance assessment: Needs assistance Sitting-balance support: Feet supported, Bilateral upper extremity supported Sitting balance-Leahy Scale: Good     Standing balance support: Bilateral upper extremity supported, During functional activity, Reliant on assistive device for balance Standing balance-Leahy Scale: Fair                             ADL either performed or assessed with clinical judgement    Extremity/Trunk Assessment Upper Extremity Assessment Upper Extremity Assessment: Overall WFL for tasks assessed   Lower Extremity Assessment Lower Extremity Assessment: Generalized weakness        Vision Patient Visual Report: No change from baseline            Cognition Arousal/Alertness: Awake/alert Behavior During Therapy: Flat affect Overall Cognitive Status: Within Functional Limits for tasks assessed Area of Impairment: Attention, Awareness, Safety/judgement                   Current Attention Level: Selective Memory: Decreased short-term memory Following Commands: Follows one step commands with increased time Safety/Judgement: Decreased awareness of deficits, Decreased awareness of safety Awareness: Emergent                       Pertinent Vitals/ Pain       Pain Assessment Pain Assessment: No/denies pain         Frequency  Min 3X/week        Progress Toward Goals  OT Goals(current goals can now be found in the care plan section)  Progress towards OT goals: Progressing toward goals     Plan Discharge plan remains appropriate;Frequency remains appropriate       AM-PAC OT "6 Clicks" Daily Activity     Outcome Measure   Help from another person eating meals?: A Little Help from another person taking care of personal grooming?: A Little Help from another person toileting, which includes using toliet, bedpan, or urinal?: A Little Help from another person bathing (including washing, rinsing, drying)?: A Little Help from  another person to put on and taking off regular upper body clothing?: A Little Help from another person to put on and taking off regular lower body clothing?: A Little 6 Click Score: 18    End of Session Equipment Utilized During Treatment: Rolling walker (2 wheels)  OT Visit Diagnosis: Other abnormalities of gait and mobility (R26.89);Muscle weakness (generalized) (M62.81)   Activity Tolerance Patient tolerated treatment well   Patient Left with call bell/phone within reach;in bed;with bed alarm set   Nurse Communication Mobility status        Time: 6578-4696 OT Time Calculation (min): 19 min  Charges: OT General Charges $OT Visit: 1 Visit OT Treatments $Therapeutic Activity: 8-22 mins  Jackquline Denmark, MS, OTR/L , CBIS ascom [phone]  07/01/22, 4:27 PM

## 2022-07-01 NOTE — Progress Notes (Signed)
PROGRESS NOTE  Ramon Zanders ZOX:[id number] DOB: September 28, 1940 DOA: 06/24/2022 PCP: Marlan Palau  Brief History: 82 yo male with hypertension, hyperlipidemia, hypothyroidism, rheumatoid arthritis who presented to [facility] ER on 06/17 via EMS after hitting his nose resulting in a nosebleed following a mechanical fall. Pt ambulates with a walker at baseline and it slipped out from under him.  06/17: admitted for subdural hematoma.  He underwent craniotomy for evacuation.  06/18: doing well post-op, remains in ICU for close monitoring. Participating w/ PT/OT. Repeat CT Head revealed nearly resolved midline shift after right subdural hematoma decompression and no new abnormality.  Neurosurgery ok with transitioning pt to stepdown status  06/19: transfer to hospitalist service. Trops remain elevated.  No regional wall motion abnormalities on 2D echo.  Assessment/Plan:  Hypotension Patient experienced a significant drop in his blood pressure on 6/22.  He became unresponsive for a brief while.  Likely triggered by doxazosin.  No other reason found for hypotension.  He did not have fever.  No overt bleeding noted.  CT head also showed stable findings. Doxazosin and lisinopril were discontinued.  He was given fluid boluses with improvement in blood pressure and mentation.   Seems to be back to baseline now.  Blood pressures have improved.  Continue to monitor off of antihypertensives.    Subdural hematoma secondary to mechanical fall s/p right craniotomy/right-sided facial twitching Seen by neurosurgery.  Underwent craniotomy.  Followed up by them.  Stable for the most part. Initially supposed to get Keppra for 1 week.  However in view of facial twitching this has been extended by a week.  Will now end on 7/1. Continue PT OT with assistance and fall precautions.  Patient declined SNF.  However he was asked to reconsider his decision. CT head was repeated on 6/22 due to altered mental status and did not show  any new findings.  Neurosurgery was informed. Patient was noted to have right-sided facial twitching.  Neurology was consulted.  Keppra course to be extended by 1 week.  EEG has been ordered and is pending.  Acute urinary retention Foley catheter had to be placed after multiple In-N-Out catheterization.   He is allergic to Flomax and experienced a significant drop in blood pressure to doxazosin.   He will be discharged with a Foley catheter. He will need outpatient follow-up with urology.   Acute blood loss anemia in the setting of subdural hematoma and craniotomy Some drop in hemoglobin is likely due to dilution from IV fluids given when he was hypotensive. Hemoglobin is stable for the most part.  No overt bleeding noted.  Elevated troponin, suspect from brain injury. High-sensitivity troponin peaked at 683 and downtrending to 163. No wall motion abnormalities on 2D echo.  Leukocytosis Suspect reactive in the setting of brain injury and surgery.  Now resolved.   Essential hypertension/hyperlipidemia/mild hyponatremia See above regarding hypertension. Continue with statin. Sodium level stable.   Hypothyroidism  Continue synthroid    Rheumatoid arthritis  Continue methotrexate   Ambulatory dysfunction Continue PT OT with assistance and fall precautions.     DVT prophylaxis: Subcu Lovenox daily. Pertinent IV fluids/nutrition: None. Code Status: FULL CODE  TOC needs / Dispo plan: Patient is now agreeable to SNF.  Social worker is following..    Objective: Vitals:   06/30/22 2034 07/01/22 0452 07/01/22 0500 07/01/22 0732  BP: 130/68 114/61  119/61  Pulse: 72 70  70  Resp: 18 18  18   Temp: 98.8 F (37.1 C) 98.3 F (  36.8 C)  98.4 F (36.9 C)  TempSrc:    Oral  SpO2: 100% 98%  98%  Weight:   75.8 kg   Height:        Intake/Output Summary (Last 24 hours) at 07/01/2022 1000 Last data filed at 07/01/2022 0741 Gross per 24 hour  Intake 480 ml  Output 1450 ml  Net -970  ml    Filed Weights   06/28/22 0144 06/30/22 0500 07/01/22 0500  Weight: 75.6 kg 76 kg 75.8 kg    Exam:  General appearance: Awake alert.  In no distress Resp: Clear to auscultation bilaterally.  Normal effort Cardio: S1-S2 is normal regular.  No S3-S4.  No rubs murmurs or bruit GI: Abdomen is soft.  Nontender nondistended.  Bowel sounds are present normal.  No masses organomegaly Extremities: No edema.  Moving all of his extremities.  Physical deconditioning is noted.     Data Reviewed: CBC: Recent Labs  Lab 06/25/22 0442 06/27/22 0536 06/28/22 0637 06/29/22 1622 06/30/22 0513 07/01/22 0437  WBC 17.5* 12.4* 10.2 8.7 8.3 10.2  NEUTROABS 15.8*  --   --   --   --   --   HGB 10.3* 8.6* 9.2* 8.9* 7.9* 8.2*  HCT 32.9* 28.0* 28.8* 27.7* 24.9* 26.1*  MCV 99.1 100.4* 97.3 97.9 98.4 97.8  PLT 243 209 228 241 235 276    Basic Metabolic Panel: Recent Labs  Lab 06/27/22 0536 06/28/22 0637 06/29/22 0537 06/30/22 0513 07/01/22 0437  NA 137 132* 134* 133* 134*  K 4.1 4.1 4.0 4.2 4.0  CL 104 100 102 105 104  CO2 28 25 25 24 24   GLUCOSE 115* 124* 106* 107* 100*  BUN 30* 28* 23 27* 27*  CREATININE 1.11 1.09 0.99 1.06 1.08  CALCIUM 8.4* 8.6* 8.5* 8.1* 8.3*    GFR: Estimated Creatinine Clearance: 46.8 mL/min (by C-G formula based on SCr of 1.08 mg/dL).   CBG: Recent Labs  Lab 06/24/22 1725 06/29/22 1323  GLUCAP 147* 124*     Recent Results (from the past 240 hour(s))  MRSA Next Gen by PCR, Nasal     Status: None   Collection Time: 06/24/22  7:30 PM   Specimen: Nasal Mucosa; Nasal Swab  Result Value Ref Range Status   MRSA by PCR Next Gen NOT DETECTED NOT DETECTED Final    Comment: (NOTE) The GeneXpert MRSA Assay (FDA approved for NASAL specimens only), is one component of a comprehensive MRSA colonization surveillance program. It is not intended to diagnose MRSA infection nor to guide or monitor treatment for MRSA infections. Test performance is not FDA  approved in patients less than 1 years old. Performed at [facility], [address], [place], [place] [address]       Studies: No results found.  Scheduled Meds:  atorvastatin  20 mg Oral Daily   azelastine  1 spray Each Nare BID   Chlorhexidine Gluconate Cloth  6 each Topical Daily   enoxaparin (LOVENOX) injection  40 mg Subcutaneous Daily   feeding supplement  237 mL Oral TID BM   folic acid  2 mg Oral Daily   gabapentin  100 mg Oral BID   levothyroxine  100 mcg Oral Daily   methotrexate  12.5 mg Oral Weekly   multivitamin with minerals  1 tablet Oral Daily   pantoprazole  40 mg Oral q morning   polyethylene glycol  17 g Oral Daily   senna-docusate  2 tablet Oral BID    Continuous  Infusions:   LOS: 7 days     Osvaldo Shipper, MD [facility]   If 7PM-7AM, please contact night-coverage [email or web address] Password [email or web address] 07/01/2022, 10:00 AM

## 2022-07-01 NOTE — Consult Note (Addendum)
NEUROLOGY CONSULTATION NOTE   Date of service: July 01, 2022 Patient Name: Mitchell Ayers MRN:  [id number] DOB:  [birth year] Reason for consult: R facial twitching Requesting physician: Dr. Osvaldo Shipper _ _ _   _ __   _ __ _ _  __ __   _ __   __ _  History of Present Illness   This is a 82 yo man with hx HTN, HL, hypothyroidism, RA who was BIB EMS to ED 6/17 after a mechanical fall with head trauma causing epistaxis. Patient ambulates with walker at baseline and it slipped out from under him causing the fall. Head CT in ED revealed a SDH and he underwent craniotomy for evacuation.   Yesterday he had an episode of R facial twitching witnessed by several family members who provided collateral hx. He had no AMS associated with the event. The movements stopped/started abruptly, abated when he gentle pressed his hand to his R cheek, and were not rhythmic. Of note his SDH was R frontal in location. He is on day 6 of 7 of brief prophylactic course of keppra. CT head yesterday was stable from prior (personal review).   ROS   Per HPI: all other systems reviewed and are negative  Past History   I have reviewed the following:  Past Medical History:  Diagnosis Date   Anxiety    HLD (hyperlipidemia)    HTN (hypertension)    Hypothyroid    Normocytic anemia    RA (rheumatoid arthritis) (HCC)    Past Surgical History:  Procedure Laterality Date   CHOLECYSTECTOMY     CRANIOTOMY Right 06/24/2022   Procedure: CRANIOTOMY HEMATOMA EVACUATION SUBDURAL;  Surgeon: Venetia Night, MD;  Location: [facility] ORS;  Service: Neurosurgery;  Laterality: Right;   HIP ARTHROPLASTY Right 06/13/2021   Procedure: ARTHROPLASTY BIPOLAR HIP (HEMIARTHROPLASTY);  Surgeon: Ross Marcus, MD;  Location: [facility] ORS;  Service: Orthopedics;  Laterality: Right;   Left knee surgery     Family History  Problem Relation Age of Onset   Hypertension Mother    Parkinson's disease Father    Stomach cancer Father    Social  History   Socioeconomic History   Marital status: Married    Spouse name: Not on file   Number of children: Not on file   Years of education: Not on file   Highest education level: Not on file  Occupational History   Not on file  Tobacco Use   Smoking status: Former    Types: Cigarettes   Smokeless tobacco: Never  Substance and Sexual Activity   Alcohol use: Not Currently   Drug use: Never   Sexual activity: Not on file  Other Topics Concern   Not on file  Social History Narrative   Not on file   Social Determinants of Health   Financial Resource Strain: Not on file  Food Insecurity: No Food Insecurity (06/25/2022)   Hunger Vital Sign    Worried About Running Out of Food in the Last Year: Never true    Ran Out of Food in the Last Year: Never true  Transportation Needs: No Transportation Needs (06/25/2022)   PRAPARE - Administrator, Civil Service (Medical): No    Lack of Transportation (Non-Medical): No  Physical Activity: Not on file  Stress: Not on file  Social Connections: Not on file   Allergies  Allergen Reactions   Doxazosin Other (See Comments)    Experienced a significant drop in blood pressure after receiving doxazosin.  Tetracyclines & Related Anaphylaxis   Tamsulosin     Other reaction(s): Headache Left sided headache    Medications   Medications Prior to Admission  Medication Sig Dispense Refill Last Dose   atorvastatin (LIPITOR) 20 MG tablet Take 20 mg by mouth daily.   06/23/2022   Azelastine HCl 137 MCG/SPRAY SOLN Place 1 spray into both nostrils 2 (two) times daily.   06/23/2022   clorazepate (TRANXENE) 7.5 MG tablet Take 7.5 mg by mouth 3 (three) times daily.   06/23/2022   clotrimazole-betamethasone (LOTRISONE) cream Apply 1 application. topically daily.   prn at unk   diphenoxylate-atropine (LOMOTIL) 2.5-0.025 MG tablet Take 2 tablets by mouth 3 (three) times daily as needed for diarrhea or loose stools.   prn at unk   fluticasone  (FLONASE) 50 MCG/ACT nasal spray Place 2 sprays into both nostrils daily.   prn at unk   folic acid (FOLVITE) 1 MG tablet Take 2 mg by mouth daily.   06/23/2022   gabapentin (NEURONTIN) 100 MG capsule Take 100 mg by mouth 2 (two) times daily.   06/23/2022   levothyroxine (SYNTHROID) 100 MCG tablet Take 100 mcg by mouth daily.   06/23/2022   lisinopril (ZESTRIL) 5 MG tablet Take 5 mg by mouth 2 (two) times daily.   06/23/2022   methocarbamol (ROBAXIN) 500 MG tablet Take 1 tablet (500 mg total) by mouth every 8 (eight) hours as needed for muscle spasms. 15 tablet 0 prn at unk   methotrexate (RHEUMATREX) 2.5 MG tablet Take 12.5 mg by mouth once a week.   06/23/2022   pantoprazole (PROTONIX) 40 MG tablet Take 40 mg by mouth every morning.   06/23/2022   predniSONE (DELTASONE) 10 MG tablet Take by mouth. Pred pak   06/23/2022   amoxicillin (AMOXIL) 500 MG tablet Take 2,000 mg by mouth as directed. One hour prior to Dental Treatment (Patient not taking: Reported on 06/24/2022)   Not Taking   aspirin EC 81 MG tablet Take 1 tablet (81 mg total) by mouth 2 (two) times daily. Swallow whole. (Patient not taking: Reported on 06/24/2022) 28 tablet 0 Not Taking   HYDROcodone-acetaminophen (NORCO/VICODIN) 5-325 MG tablet Take 1-2 tablets by mouth every 4 (four) hours as needed for moderate pain (pain score 4-6). (Patient not taking: Reported on 06/24/2022) 20 tablet 0 Not Taking      Current Facility-Administered Medications:    acetaminophen (TYLENOL) tablet 650 mg, 650 mg, Oral, Q6H PRN, Darlin Drop, DO, 650 mg at 06/30/22 2046   atorvastatin (LIPITOR) tablet 20 mg, 20 mg, Oral, Daily, Venetia Night, MD, 20 mg at 06/30/22 0951   azelastine (ASTELIN) 0.1 % nasal spray 1 spray, 1 spray, Each Nare, BID, Venetia Night, MD, 1 spray at 06/29/22 0849   Chlorhexidine Gluconate Cloth 2 % PADS 6 each, 6 each, Topical, Daily, Osvaldo Shipper, MD, 6 each at 06/30/22 1355   clorazepate (TRANXENE) tablet 7.5 mg, 7.5 mg,  Oral, QHS PRN, Enedina Finner, MD, 7.5 mg at 06/30/22 2046   enoxaparin (LOVENOX) injection 40 mg, 40 mg, Subcutaneous, Daily, Venetia Night, MD, 40 mg at 06/30/22 0953   feeding supplement (ENSURE ENLIVE / ENSURE PLUS) liquid 237 mL, 237 mL, Oral, TID BM, Mannam, Praveen, MD, 237 mL at 06/30/22 2030   fluticasone (FLONASE) 50 MCG/ACT nasal spray 2 spray, 2 spray, Each Nare, Daily PRN, Venetia Night, MD   folic acid (FOLVITE) tablet 2 mg, 2 mg, Oral, Daily, Venetia Night, MD, 2 mg at 06/30/22 [phone]  gabapentin (NEURONTIN) capsule 100 mg, 100 mg, Oral, BID, Venetia Night, MD, 100 mg at 06/30/22 2046   levETIRAcetam (KEPPRA) tablet 500 mg, 500 mg, Oral, BID, Venetia Night, MD, 500 mg at 06/30/22 2047   levothyroxine (SYNTHROID) tablet 100 mcg, 100 mcg, Oral, Daily, Venetia Night, MD, 100 mcg at 06/30/22 0636   methocarbamol (ROBAXIN) tablet 500 mg, 500 mg, Oral, Q8H PRN, Venetia Night, MD   methotrexate (RHEUMATREX) tablet 12.5 mg, 12.5 mg, Oral, Weekly, Venetia Night, MD, 12.5 mg at 06/30/22 2956   multivitamin with minerals tablet 1 tablet, 1 tablet, Oral, Daily, Mannam, Praveen, MD, 1 tablet at 06/30/22 0951   naphazoline-glycerin (CLEAR EYES REDNESS) ophth solution 1-2 drop, 1-2 drop, Both Eyes, QID PRN, Dow Adolph N, DO, 1 drop at 06/30/22 0145   Oral care mouth rinse, 15 mL, Mouth Rinse, PRN, Mannam, Praveen, MD   pantoprazole (PROTONIX) EC tablet 40 mg, 40 mg, Oral, q morning, Venetia Night, MD, 40 mg at 06/30/22 0951   polyethylene glycol (MIRALAX / GLYCOLAX) packet 17 g, 17 g, Oral, Daily, Osvaldo Shipper, MD, 17 g at 06/30/22 2130   senna-docusate (Senokot-S) tablet 2 tablet, 2 tablet, Oral, BID, Osvaldo Shipper, MD, 2 tablet at 06/30/22 2046  Vitals   Vitals:   06/30/22 0756 06/30/22 2034 07/01/22 0452 07/01/22 0500  BP: (!) 109/57 130/68 114/61   Pulse: 65 72 70   Resp: 16 18 18    Temp: 97.9 F (36.6 C) 98.8 F (37.1 C) 98.3 F (36.8 C)    TempSrc:      SpO2: 100% 100% 98%   Weight:    75.8 kg  Height:         Body mass index is 26.97 kg/m.  Physical Exam   Physical Exam Gen: A&O x4, NAD HEENT: Significant peri-orbital and surrounding facial echymoses, normocephalic;mucous membranes moist; oropharynx clear, tongue without atrophy or fasciculations. Neck: Supple, trachea midline. Resp: CTAB, no w/r/r CV: RRR, no m/g/r; nml S1 and S2. 2+ symmetric peripheral pulses. Abd: soft/NT/ND; nabs x 4 quad Extrem: Nml bulk; no cyanosis, clubbing, or edema.  Neuro: *MS: A&O x4. Follows multi-step commands.  *Speech: minimally delayed but fluid, nondysarthric, able to name and repeat *CN:    I: Deferred   II,III: PERRLA, VFF by confrontation, optic discs unable to be visualized 2/2 pupillary constriction   III,IV,VI: EOMI w/o nystagmus, no ptosis   V: Sensation intact from V1 to V3 to LT   VII: Eyelid closure was full.  Smile symmetric.   VIII: Hearing intact to voice   IX,X: Voice normal, palate elevates symmetrically    XI: SCM/trap 5/5 bilat   XII: Tongue protrudes midline, no atrophy or fasciculations   *Motor:   Normal bulk.  No tremor, rigidity or bradykinesia. No pronator drift.    Strength: Dlt Bic Tri WrE WrF FgS Gr HF KnF KnE PlF DoF    Left 5 5 5 5 5 5 5 5 5 5 5 5     Right 5 5 5 5 5 5 5 5 5 5 5 5     *Sensory: Intact to light touch, pinprick, temperature vibration throughout. Symmetric. Propioception intact bilat.  No double-simultaneous extinction.  *Coordination:  Finger-to-nose, heel-to-shin, rapid alternating motions were intact. *Reflexes:  2+ and symmetric throughout without clonus; toes down-going bilat *Gait: deferred    Labs   CBC:  Recent Labs  Lab 06/24/22 0546 06/25/22 0442 06/27/22 0536 06/30/22 0513 07/01/22 0437  WBC 12.6* 17.5*   < > 8.3 10.2  NEUTROABS 9.6* 15.8*  --   --   --   HGB 11.4* 10.3*   < > 7.9* 8.2*  HCT 36.7* 32.9*   < > 24.9* 26.1*  MCV 99.7 99.1   < > 98.4 97.8   PLT 258 243   < > 235 276   < > = values in this interval not displayed.    Basic Metabolic Panel:  Lab Results  Component Value Date   NA 134 (L) 07/01/2022   K 4.0 07/01/2022   CO2 24 07/01/2022   GLUCOSE 100 (H) 07/01/2022   BUN 27 (H) 07/01/2022   CREATININE 1.08 07/01/2022   CALCIUM 8.3 (L) 07/01/2022   GFRNONAA >60 07/01/2022   Lipid Panel: No results found for: "LDLCALC" HgbA1c: No results found for: "HGBA1C" Urine Drug Screen: No results found for: "LABOPIA", "COCAINSCRNUR", "LABBENZ", "AMPHETMU", "THCU", "LABBARB"  Alcohol Level No results found for: "ETH"   Head CT 6/17 in ED: 1. Positive for widespread right hemisphere Subdural hematoma, tracking onto the right tentorium and also inferiorly along the dorsal clivus toward the foramen magnum. SDH is up to 9 mm in thickness. 2. Associated mild intracranial mass effect with 5 mm of leftward midline shift. 3. No other acute traumatic injury to the brain. 4. Left forehead scalp laceration and hematoma with No skull fracture identified.  Repeat head CT 06/29/22 1. Postsurgical changes from right frontotemporal craniotomy and underlying subdural hematoma evacuation. Decreased pneumocephalus since prior exam. 2. Stable residual right sided subdural hematoma, with no significant change in the minimal mass effect and leftward midline shift as above. 3. No new areas of infarct or hemorrhage.  CNS imaging personally reviewed; I agree with above interpretations  Impression   This is a 82 yo man with hx HTN, HL, hypothyroidism, RA admitted for traumatic SDH after mechannical fall s/p craniotomy for evacuation. Yesterday he had an episode of R facial twitching. Neurology is consulted 2/2 concern for seizure. The episode he describes is unlikely to be seizure for multiple reasons: twitching was ipsilateral to the injury, it was not rhythmic, not associated with AMS or other sx, started/stopped/restarted abruptly, and was  suppressible with gentle pressure applied to his cheek. Family and patient are understandably anxious about this especially since they are scheduled to stop prophylactic keppra tomorrow. I explained my findings and rationale that this was likely not epileptic, however we agreed that it would be reasonable to continue the keppra for an additional week to be on the safe side and also get an EEG for further evaluation. In the absence of interictal discharges on EEG I would not recommend keppra beyond an additional week.  Recommendations   - Extend keppra seizure prophylaxis by one week (new end date 7/1) - rEEG  Neurology will f/u on EEG; if no epileptiform discharges are observed no further workup recommended. ______________________________________________________________________   Thank you for the opportunity to take part in the care of this patient. If you have any further questions, please contact the neurology consultation attending.  Signed,  Bing Neighbors, MD [facility] [phone]  If 7pm- 7am, please page neurology on call as listed in AMION.  **Any copied and pasted documentation in this note was written by me in another application not billed for and pasted by me into this document.

## 2022-07-01 NOTE — TOC Progression Note (Addendum)
Transition of Care [facility]) - Progression Note    Patient Details  Name: Mitchell Ayers MRN: [id number] Date of Birth: [birth year]  Transition of Care [facility]) CM/SW Contact  Allena Katz, LCSW Phone Number: 07/01/2022, 11:32 AM  Clinical Narrative:   CSW LVM with wife to discuss bed offers for patient.  CSW also left VM for daughter.    Expected Discharge Plan:  (TBD) Barriers to Discharge: Continued Medical Work up  Expected Discharge Plan and Services       Living arrangements for the past 2 months: Single Family Home                                       Social Determinants of Health (SDOH) Interventions SDOH Screenings   Food Insecurity: No Food Insecurity (06/25/2022)  Housing: Low Risk  (06/25/2022)  Transportation Needs: No Transportation Needs (06/25/2022)  Utilities: Not At Risk (06/25/2022)  Tobacco Use: Medium Risk (06/26/2022)    Readmission Risk Interventions     No data to display

## 2022-07-01 NOTE — Progress Notes (Signed)
Mobility Specialist - Progress Note   07/01/22 1630  Mobility  Activity Refused mobility     Pt politely declined mobility, reports recent ambulation with OT prior to entry. Will attempt another date/time. Family at bedside.    Filiberto Pinks Mobility Specialist 07/01/22, 4:30 PM

## 2022-07-01 NOTE — Progress Notes (Signed)
Eeg done 

## 2022-07-02 DIAGNOSIS — S065XAA Traumatic subdural hemorrhage with loss of consciousness status unknown, initial encounter: Secondary | ICD-10-CM | POA: Diagnosis not present

## 2022-07-02 MED ORDER — ADULT MULTIVITAMIN W/MINERALS CH: 1.0000 | ORAL_TABLET | Freq: Every day | ORAL | Status: AC

## 2022-07-02 MED ORDER — ENSURE ENLIVE PO LIQD: 237.0000 mL | Freq: Three times a day (TID) | ORAL | 12 refills | Status: DC

## 2022-07-02 MED ORDER — NAPHAZOLINE-GLYCERIN 0.012-0.25 % OP SOLN: 1.0000 [drp] | Freq: Four times a day (QID) | OPHTHALMIC | Status: AC | PRN

## 2022-07-02 MED ORDER — PHENOL 1.4 % MT LIQD: 1.0000 | OROMUCOSAL | Status: DC | PRN

## 2022-07-02 MED ORDER — GABAPENTIN 100 MG PO CAPS: 100.0000 mg | ORAL_CAPSULE | Freq: Two times a day (BID) | ORAL | 0 refills | Status: AC

## 2022-07-02 MED ORDER — LEVETIRACETAM 500 MG PO TABS: 500.0000 mg | ORAL_TABLET | Freq: Two times a day (BID) | ORAL | Status: DC

## 2022-07-02 MED ORDER — CLORAZEPATE DIPOTASSIUM 7.5 MG PO TABS: 7.5000 mg | ORAL_TABLET | Freq: Two times a day (BID) | ORAL | 0 refills | Status: AC | PRN

## 2022-07-02 MED ORDER — SENNOSIDES-DOCUSATE SODIUM 8.6-50 MG PO TABS: 2.0000 | ORAL_TABLET | Freq: Two times a day (BID) | ORAL | Status: AC

## 2022-07-02 MED ORDER — ACETAMINOPHEN 325 MG PO TABS: 650.0000 mg | ORAL_TABLET | Freq: Four times a day (QID) | ORAL | Status: AC | PRN

## 2022-07-02 MED ORDER — POLYETHYLENE GLYCOL 3350 17 G PO PACK: 17.0000 g | PACK | Freq: Two times a day (BID) | ORAL | 0 refills | Status: DC

## 2022-07-02 MED ORDER — METHOCARBAMOL 500 MG PO TABS: 500.0000 mg | ORAL_TABLET | Freq: Three times a day (TID) | ORAL | 0 refills | Status: DC | PRN

## 2022-07-02 MED ORDER — PHENOL 1.4 % MT LIQD: 1.0000 | OROMUCOSAL | 0 refills | Status: DC | PRN

## 2022-07-02 NOTE — Progress Notes (Addendum)
   Neurosurgery Progress Note  History: Mitchell Ayers is here for subdural hematoma.  He underwent craniotomy for subdural hematoma evacuation on June 24, 2022.  POD8: Pt complaining of a mild sore throat this morning and mild headache.  POD6: Stable, no headache.  Yesterday had a episode of decreased mental status in the setting of a hypotensive episode.  He was given fluid and recovered slowly.  He is back to his baseline today.  Has been up and walking with physical therapy. POD5: Stable, no headache.  Motivated to get up with assistance POD4: Stable, walking with assistance POD3: Pt attempting to ambulate to the bathroom with assistance  POD2: Complains of intermittent headache but overall doing well  POD 1: He is doing much better.  He has minimal headache.  He is much brighter.  Physical Exam: Vitals:   07/01/22 2142 07/02/22 0328  BP: 138/81 (!) 103/59  Pulse: 71 68  Resp: 18 17  Temp: 98.8 F (37.1 C) 98.2 F (36.8 C)  SpO2: 100% 97%    AA Ox3  CNI Strength:5/5 throughout bilateral upper extremities Incision is clean dry and intact and healing well.  Data:  Other tests/results: CT scan 06/25/22 reviewed-expected postoperative fluid with resolution of midline shift and improvement in brain compression  Narrative & Impression  CLINICAL DATA:  Altered level of consciousness, history of subdural hematoma status post decompression   EXAM: CT HEAD WITHOUT CONTRAST   TECHNIQUE: Contiguous axial images were obtained from the base of the skull through the vertex without intravenous contrast.   RADIATION DOSE REDUCTION: This exam was performed according to the departmental dose-optimization program which includes automated exposure control, adjustment of the mA and/or kV according to patient size and/or use of iterative reconstruction technique.   COMPARISON:  06/25/2022   FINDINGS: Brain: Postsurgical changes are again seen from right frontotemporal craniotomy.  Decreased pneumocephalus since prior exam. Persistent residual right-sided subdural hematoma measuring up to 1.1 cm, not significantly changed since prior exam. Blood products extend along the right side tentorium, unchanged.   There are no new areas of intracranial hemorrhage. No signs of acute infarct. Persistent mild leftward midline shift measuring up to 4 mm. Lateral ventricles and midline structures are stable.   Vascular: No hyperdense vessel or unexpected calcification.   Skull: Postsurgical changes from right frontotemporal craniotomy. No acute bony abnormalities.   Sinuses/Orbits: Stable postsurgical changes.  No acute process.   Other: None.   IMPRESSION: 1. Postsurgical changes from right frontotemporal craniotomy and underlying subdural hematoma evacuation. Decreased pneumocephalus since prior exam. 2. Stable residual right sided subdural hematoma, with no significant change in the minimal mass effect and leftward midline shift as above. 3. No new areas of infarct or hemorrhage.     Electronically Signed   By: Sharlet Salina M.D.   On: 06/29/2022 15:57       Assessment/Plan:  Mitchell Ayers is substantially improved after craniotomy for evacuation of an acute subdural hematoma.    - pain control - DVT prophylaxis - PTOT - drain removed 6/19  - dressing removed. Staples removed 6/25 without concern - discussed sore throat with medicine. Throat spray ordered - Dispo planning pending, likely to SNF. Will plan to follow up outpatient as scheduled on 7/25  Manning Charity PA-C Department of Neurosurgery

## 2022-07-02 NOTE — Discharge Summary (Signed)
[facility]  Physician Discharge Summary   Patient ID: Mitchell Ayers MRN: [id number] DOB/AGE: 1940/01/22 82 y.o.  Admit date: 06/24/2022 Discharge date:   07/02/2022   PCP: Marlan Palau  DISCHARGE DIAGNOSES:    Traumatic subdural hematoma with loss of consciousness (HCC) History of rheumatoid arthritis Hypothyroidism Transient hypotension, resolved  RECOMMENDATIONS FOR OUTPATIENT FOLLOW UP: Please check CBC and basic metabolic panel in 1 week Dr. Myer Haff to arrange outpatient follow-up Please arrange referral to urology for voiding trial once patient is more ambulatory Foley care   Home Health: Going to SNF Equipment/Devices: None  CODE STATUS: Full code  DISCHARGE CONDITION: fair  Diet recommendation: Regular as tolerated  INITIAL HISTORY: 82 yo male with hypertension, hyperlipidemia, hypothyroidism, rheumatoid arthritis who presented to [facility] on 06/17 via EMS after hitting his nose resulting in a nosebleed following a mechanical fall. Pt ambulates with a walker at baseline and it slipped out from under him.  Found to have subdural hematoma.  He was hospitalized for further management.  Consultations: Neurosurgery Neurology  Procedures: Craniotomy EEG    HOSPITAL COURSE:   Subdural hematoma secondary to mechanical fall s/p right craniotomy/right-sided facial twitching Seen by neurosurgery.  Underwent craniotomy.  Followed up by them.  Stable for the most part. Initially supposed to get Keppra for 1 week.  However in view of facial twitching this has been extended by a week.  Will now end on 7/1. CT head was repeated on 6/22 due to altered mental status and did not show any new findings.  Neurosurgery was informed. Patient was noted to have right-sided facial twitching.  Neurology was consulted.  Keppra course to be extended by 1 week.  EEG did not show any seizure or epileptiform activity.   Acute urinary retention Foley catheter had to be placed  after multiple In-N-Out catheterization.   He is allergic to Flomax and experienced a significant drop in blood pressure to doxazosin.   He will be discharged with a Foley catheter. He will need outpatient follow-up with urology.  Voiding trial after he is more ambulatory.  Hypotension Patient experienced a significant drop in his blood pressure on 6/22.  He became unresponsive for a brief while.  Likely triggered by doxazosin.  No other reason found for hypotension.  He did not have fever.  No overt bleeding noted.  CT head also showed stable findings. Doxazosin and lisinopril were discontinued.  He was given fluid boluses with improvement in blood pressure and mentation.   Seems to be back to baseline now.  Blood pressures have improved.  Continue to monitor off of antihypertensives.    Acute blood loss anemia in the setting of subdural hematoma and craniotomy Some drop in hemoglobin is likely due to dilution from IV fluids given when he was hypotensive. Hemoglobin is stable for the most part.  No overt bleeding noted.   Elevated troponin, suspect from brain injury. High-sensitivity troponin peaked at 683 and downtrending to 163. No wall motion abnormalities on 2D echo.   Leukocytosis Suspect reactive in the setting of brain injury and surgery.  Now resolved.   Essential hypertension/hyperlipidemia/mild hyponatremia See above regarding hypertension. Continue with statin. Sodium level stable.   Hypothyroidism  Continue synthroid    Rheumatoid arthritis  Continue methotrexate    Ambulatory dysfunction Continue PT OT with assistance and fall precautions.    Patient feels well.  Complains of mild sore throat this morning.  Throat was examined.  No lesions identified.  Chloraseptic spray has been  ordered.  Neurosurgery to remove staples today.  Okay for discharge to SNF.   PERTINENT LABS:  The results of significant diagnostics from this hospitalization (including imaging,  microbiology, ancillary and laboratory) are listed below for reference.    Microbiology: Recent Results (from the past 240 hour(s))  MRSA Next Gen by PCR, Nasal     Status: None   Collection Time: 06/24/22  7:30 PM   Specimen: Nasal Mucosa; Nasal Swab  Result Value Ref Range Status   MRSA by PCR Next Gen NOT DETECTED NOT DETECTED Final    Comment: (NOTE) The GeneXpert MRSA Assay (FDA approved for NASAL specimens only), is one component of a comprehensive MRSA colonization surveillance program. It is not intended to diagnose MRSA infection nor to guide or monitor treatment for MRSA infections. Test performance is not FDA approved in patients less than 81 years old. Performed at [facility], [address] [place], [place] [address]      Labs:   Basic Metabolic Panel: Recent Labs  Lab 06/27/22 0536 06/28/22 3474 06/29/22 0537 06/30/22 0513 07/01/22 0437  NA 137 132* 134* 133* 134*  K 4.1 4.1 4.0 4.2 4.0  CL 104 100 102 105 104  CO2 28 25 25 24 24   GLUCOSE 115* 124* 106* 107* 100*  BUN 30* 28* 23 27* 27*  CREATININE 1.11 1.09 0.99 1.06 1.08  CALCIUM 8.4* 8.6* 8.5* 8.1* 8.3*    CBC: Recent Labs  Lab 06/27/22 0536 06/28/22 0637 06/29/22 1622 06/30/22 0513 07/01/22 0437  WBC 12.4* 10.2 8.7 8.3 10.2  HGB 8.6* 9.2* 8.9* 7.9* 8.2*  HCT 28.0* 28.8* 27.7* 24.9* 26.1*  MCV 100.4* 97.3 97.9 98.4 97.8  PLT 209 228 241 235 276    CBG: Recent Labs  Lab 06/29/22 1323  GLUCAP 124*     IMAGING STUDIES EEG adult  Result Date: 07/01/2022 Charlsie Quest, MD     07/01/2022 12:44 PM Patient Name: Mitchell Ayers MRN: [id number] Epilepsy Attending: Charlsie Quest Referring Physician/Provider: Osvaldo Shipper, MD Date: 07/01/2022 Duration: 30.56 mins Patient history: 82 yo man with hx HTN, HL, hypothyroidism, RA admitted for traumatic SDH after mechannical fall s/p craniotomy for evacuation. Yesterday he had an episode of R facial twitching. EEG to evaluate for  seizure. Level of alertness: Awake, asleep AEDs during EEG study: LEV, GBP, Tranxene Technical aspects: This EEG study was done with scalp electrodes positioned according to the 10-20 International system of electrode placement. Electrical activity was reviewed with band pass filter of 1-70Hz , sensitivity of 7 uV/mm, display speed of 61mm/sec with a 60Hz  notched filter applied as appropriate. EEG data were recorded continuously and digitally stored.  Video monitoring was available and reviewed as appropriate. Description: The posterior dominant rhythm consists of 8-9 Hz activity of moderate voltage (25-35 uV) seen predominantly in posterior head regions, symmetric and reactive to eye opening and eye closing. Sleep was characterized by vertex waves, sleep spindles (12 to 14 Hz), maximal frontocentral region. There is an excessive amount of 15 to 18 Hz beta activity distributed symmetrically and diffusely. EEG showed continuous 3 to 6 Hz theta-delta slowing in right temporal region. Hyperventilation and photic stimulation were not performed.   ABNORMALITY - Continuous slow, right temporal region - Excessive beta, generalized IMPRESSION: This study is suggestive of cortical dysfunction arising from right temporal region likely secondary to underlying structural abnormality. The excessive beta activity seen in the background is most likely due to the effect of benzodiazepine and is a benign EEG  pattern. No seizures or epileptiform discharges were seen throughout the recording. Priyanka Annabelle Harman   CT HEAD WO CONTRAST ( )  Result Date: 06/29/2022 CLINICAL DATA:  Altered level of consciousness, history of subdural hematoma status post decompression EXAM: CT HEAD WITHOUT CONTRAST TECHNIQUE: Contiguous axial images were obtained from the base of the skull through the vertex without intravenous contrast. RADIATION DOSE REDUCTION: This exam was performed according to the departmental dose-optimization program which  includes automated exposure control, adjustment of the mA and/or kV according to patient size and/or use of iterative reconstruction technique. COMPARISON:  06/25/2022 FINDINGS: Brain: Postsurgical changes are again seen from right frontotemporal craniotomy. Decreased pneumocephalus since prior exam. Persistent residual right-sided subdural hematoma measuring up to 1.1 cm, not significantly changed since prior exam. Blood products extend along the right side tentorium, unchanged. There are no new areas of intracranial hemorrhage. No signs of acute infarct. Persistent mild leftward midline shift measuring up to 4 mm. Lateral ventricles and midline structures are stable. Vascular: No hyperdense vessel or unexpected calcification. Skull: Postsurgical changes from right frontotemporal craniotomy. No acute bony abnormalities. Sinuses/Orbits: Stable postsurgical changes.  No acute process. Other: None. IMPRESSION: 1. Postsurgical changes from right frontotemporal craniotomy and underlying subdural hematoma evacuation. Decreased pneumocephalus since prior exam. 2. Stable residual right sided subdural hematoma, with no significant change in the minimal mass effect and leftward midline shift as above. 3. No new areas of infarct or hemorrhage. Electronically Signed   By: Sharlet Salina M.D.   On: 06/29/2022 15:57   ECHOCARDIOGRAM COMPLETE  Result Date: 06/27/2022    ECHOCARDIOGRAM REPORT   Patient Name:   Deja Bellerose Date of Exam: 06/27/2022 Medical Rec #:  [id number]     Height:       66.0 in Accession #:    [id number]    Weight:       143.1 lb Date of Birth:  13-Oct-1940     BSA:          1.734 m Patient Age:    81 years      BP:           91/52 mmHg Patient Gender: M             HR:           66 bpm. Exam Location:  [facility] Procedure: 2D Echo, Cardiac Doppler and Color Doppler Indications:     Elevated Troponin  History:         Patient has no prior history of Echocardiogram examinations.                  TIA, Risk  Factors:Hypertension and Dyslipidemia.;                  Medications:Calcium Channel Blockers.  Sonographer:     Mikki Harbor Referring Phys:  [id number] Oliver Pila HALL Diagnosing Phys: Julien Nordmann MD IMPRESSIONS  1. Left ventricular ejection fraction, by estimation, is 55 to 60%. The left ventricle has normal function. The left ventricle has no regional wall motion abnormalities. Left ventricular diastolic parameters are consistent with Grade I diastolic dysfunction (impaired relaxation).  2. Right ventricular systolic function is normal. The right ventricular size is normal.  3. The mitral valve is normal in structure. No evidence of mitral valve regurgitation. No evidence of mitral stenosis.  4. The aortic valve has an indeterminant number of cusps. Aortic valve regurgitation is not visualized. No aortic stenosis is present.  5. The inferior vena cava is  normal in size with greater than 50% respiratory variability, suggesting right atrial pressure of 3 mmHg. FINDINGS  Left Ventricle: Left ventricular ejection fraction, by estimation, is 55 to 60%. The left ventricle has normal function. The left ventricle has no regional wall motion abnormalities. The left ventricular internal cavity size was normal in size. There is  no left ventricular hypertrophy. Left ventricular diastolic parameters are consistent with Grade I diastolic dysfunction (impaired relaxation). Right Ventricle: The right ventricular size is normal. No increase in right ventricular wall thickness. Right ventricular systolic function is normal. Left Atrium: Left atrial size was normal in size. Right Atrium: Right atrial size was normal in size. Pericardium: There is no evidence of pericardial effusion. Mitral Valve: The mitral valve is normal in structure. Mild mitral annular calcification. No evidence of mitral valve regurgitation. No evidence of mitral valve stenosis. MV peak gradient, 3.5 mmHg. The mean mitral valve gradient is 2.0 mmHg.  Tricuspid Valve: The tricuspid valve is normal in structure. Tricuspid valve regurgitation is mild . No evidence of tricuspid stenosis. Aortic Valve: The aortic valve has an indeterminant number of cusps. Aortic valve regurgitation is not visualized. No aortic stenosis is present. Aortic valve mean gradient measures 2.0 mmHg. Aortic valve peak gradient measures 4.7 mmHg. Aortic valve area, by VTI measures 2.98 cm. Pulmonic Valve: The pulmonic valve was normal in structure. Pulmonic valve regurgitation is not visualized. No evidence of pulmonic stenosis. Aorta: The aortic root is normal in size and structure. Venous: The inferior vena cava is normal in size with greater than 50% respiratory variability, suggesting right atrial pressure of 3 mmHg. IAS/Shunts: No atrial level shunt detected by color flow Doppler.  LEFT VENTRICLE PLAX 2D LVIDd:         3.30 cm   Diastology LVIDs:         2.10 cm   LV e' medial:    7.18 cm/s LV PW:         1.10 cm   LV E/e' medial:  9.3 LV IVS:        0.90 cm   LV e' lateral:   8.70 cm/s LVOT diam:     2.00 cm   LV E/e' lateral: 7.7 LV SV:         57 LV SV Index:   33 LVOT Area:     3.14 cm  RIGHT VENTRICLE RV Basal diam:  3.60 cm RV Mid diam:    3.40 cm RV S prime:     13.60 cm/s TAPSE (M-mode): 2.4 cm LEFT ATRIUM             Index        RIGHT ATRIUM           Index LA diam:        3.00 cm 1.73 cm/m   RA Area:     17.20 cm LA Vol (A2C):   37.3 ml 21.51 ml/m  RA Volume:   42.10 ml  24.27 ml/m LA Vol (A4C):   49.8 ml 28.71 ml/m LA Biplane Vol: 41.7 ml 24.04 ml/m  AORTIC VALVE                    PULMONIC VALVE AV Area (Vmax):    3.14 cm     PV Vmax:       1.22 m/s AV Area (Vmean):   2.75 cm     PV Peak grad:  6.0 mmHg AV Area (VTI):     2.98 cm  AV Vmax:           108.00 cm/s AV Vmean:          68.500 cm/s AV VTI:            0.193 m AV Peak Grad:      4.7 mmHg AV Mean Grad:      2.0 mmHg LVOT Vmax:         108.00 cm/s LVOT Vmean:        60.000 cm/s LVOT VTI:          0.183 m  LVOT/AV VTI ratio: 0.95  AORTA Ao Root diam: 3.60 cm MITRAL VALVE MV Area (PHT): 2.45 cm     SHUNTS MV Area VTI:   2.57 cm     Systemic VTI:  0.18 m MV Peak grad:  3.5 mmHg     Systemic Diam: 2.00 cm MV Mean grad:  2.0 mmHg MV Vmax:       0.94 m/s MV Vmean:      56.9 cm/s MV Decel Time: 310 msec MV E velocity: 67.10 cm/s MV A velocity: 103.00 cm/s MV E/A ratio:  0.65 Julien Nordmann MD Electronically signed by Julien Nordmann MD Signature Date/Time: 06/27/2022/5:15:19 PM    Final    CT HEAD WO CONTRAST ( )  Result Date: 06/25/2022 CLINICAL DATA:  Minor head trauma, postop. EXAM: CT HEAD WITHOUT CONTRAST TECHNIQUE: Contiguous axial images were obtained from the base of the skull through the vertex without intravenous contrast. RADIATION DOSE REDUCTION: This exam was performed according to the departmental dose-optimization program which includes automated exposure control, adjustment of the mA and/or kV according to patient size and/or use of iterative reconstruction technique. COMPARISON:  Yesterday FINDINGS: Brain: Evacuation of subdural hemorrhage on the right with nearly resolved midline shift. Beneath the bone flap subdural collection and gas measures up to 1 cm in thickness on coronal reformats. Clot along the right tentorium and interhemispheric fissure is unchanged, up to 1 cm in thickness below the right occipital lobe. No brain edema, infarct, or hydrocephalus. Expected postoperative gas. Vascular: No acute finding Skull: Unremarkable craniotomy. Sinuses/Orbits: Negative IMPRESSION: Nearly resolved midline shift after right subdural hematoma decompression. No new abnormality. Electronically Signed   By: Tiburcio Pea M.D.   On: 06/25/2022 05:29   CT Head Wo Contrast  Result Date: 06/24/2022 CLINICAL DATA:  82 year old male status post fall with subdural hematoma. EXAM: CT HEAD WITHOUT CONTRAST TECHNIQUE: Contiguous axial images were obtained from the base of the skull through the vertex without  intravenous contrast. RADIATION DOSE REDUCTION: This exam was performed according to the departmental dose-optimization program which includes automated exposure control, adjustment of the mA and/or kV according to patient size and/or use of iterative reconstruction technique. COMPARISON:  Head CT 0522 hours today. FINDINGS: Brain: Mildly increased conspicuity of hyperdense subdural blood along the dorsal clivus (series 5, image 27). Mixed density but mostly hyperdense right hemisphere SDH has progressed and is now uniformly greater than 5 mm, and up to 14 mm in thickness now (previous maximum 9 mm). And there is new para falcine and right lateral posterior fossa subdural blood since this morning (coronal image 54). Basilar cisterns remain patent. No tonsillar herniation. But increasing mass effect on the right hemisphere with leftward midline shift now 7 mm (previously 5 mm). Effaced right lateral ventricle. No trapping of the left lateral ventricle. But trace new IVH layering in the left occipital horn. No convincing subarachnoid hemorrhage. No hemorrhagic contusion identified. Stable gray-white matter  differentiation throughout the brain. No cortically based acute infarct identified. Vascular: Mildly increased conspicuity of hyperdense subdural blood along the dorsal clivus (series 5, image 27). Mixed density but mostly hyperdense right hemisphere SDH has progressed and is now uniformly greater than 5 mm, and up to 13 mm in thickness now (previous maximum 9 mm) Skull: No fracture identified. Sinuses/Orbits: Visualized paranasal sinuses and mastoids are stable and well aerated. Other: Stable left periorbital scalp soft tissue injury. Globes and intraorbital soft tissues remain normal. IMPRESSION: 1. Enlarging sUbdural hematoma: Increased size and extent of right hemisphere predominant SDH since 0522 hours, now up to 14 mm in thickness. New right posterior fossa and para falcine subdural blood, mildly increased  dorsal clivus blood. And increased intracranial mass effect, leftward midline shift now 7 mm (previously 5 mm). 2. Trace new IVH also. No ventriculomegaly. No convincing SAH or hemorrhagic contusion. 3. No skull fracture identified. Salient findings discussed by telephone with Dr. Trinna Post on 06/24/2022 at 12:21. Electronically Signed   By: Odessa Fleming M.D.   On: 06/24/2022 12:22   DG Thoracic Spine 2 View  Result Date: 06/24/2022 CLINICAL DATA:  82 year old male status post fall with epistaxis. Hypertensive, 204/102. Previous right hip replacement. EXAM: THORACIC SPINE 2 VIEWS COMPARISON:  Portable chest 06/12/2021. Cervical spine CT today reported separately. FINDINGS: Thoracic segmentation appears to be normal. There is a degree of generalized osteopenia. Maintained thoracic vertebral height, with mildly exaggerated thoracic kyphosis. Cervicothoracic junction alignment is within normal limits. Relatively maintained disc spaces throughout. Widespread mild endplate spurring. No acute osseous abnormality identified. Grossly intact posterior ribs. Calcified aortic atherosclerosis. Normal cardiac size and mediastinal contours. Lung ventilation appears stable from last year, within normal limits. Negative visible bowel gas. Cholecystectomy clips. IMPRESSION: 1. No acute osseous abnormality identified in the thoracic spine. 2. Aortic Atherosclerosis (ICD10-I70.0). Electronically Signed   By: Odessa Fleming M.D.   On: 06/24/2022 06:03   DG Hip Unilat W or Wo Pelvis 2-3 Views Left  Result Date: 06/24/2022 CLINICAL DATA:  82 year old male status post fall with epistaxis. Hypertensive, 204/102. Previous right hip replacement. EXAM: DG HIP (WITH OR WITHOUT PELVIS) 2-3V LEFT COMPARISON:  Postoperative pelvis radiographs 06/13/2021. FINDINGS: Chronic right hip arthroplasty. Femoral stem mostly visible, hardware appears stable and aligned. Left femoral head also normally located. On dedicated AP and frogleg lateral views the  proximal left femur appears intact. Pelvis appears stable and intact. Advanced bilateral iliofemoral calcified atherosclerosis. Advanced lower lumbar disc degeneration with vacuum disc. Nonobstructed bowel-gas pattern. IMPRESSION: 1. No acute fracture or dislocation identified about the left hip or pelvis. 2. Stable right hip arthroplasty. Calcified iliofemoral atherosclerosis. Electronically Signed   By: Odessa Fleming M.D.   On: 06/24/2022 06:01   CT Cervical Spine Wo Contrast  Result Date: 06/24/2022 CLINICAL DATA:  82 year old male status post fall with epistaxis. Hypertensive, 204/102. subdural hematoma. EXAM: CT CERVICAL SPINE WITHOUT CONTRAST TECHNIQUE: Multidetector CT imaging of the cervical spine was performed without intravenous contrast. Multiplanar CT image reconstructions were also generated. RADIATION DOSE REDUCTION: This exam was performed according to the departmental dose-optimization program which includes automated exposure control, adjustment of the mA and/or kV according to patient size and/or use of iterative reconstruction technique. COMPARISON:  CT head and face today reported separately. Previous cervical spine CT 06/12/2021. FINDINGS: Alignment: Stable straightening of cervical lordosis. Cervicothoracic junction alignment is within normal limits. Bilateral posterior element alignment is within normal limits. Skull base and vertebrae: Visualized skull base is intact. No atlanto-occipital dissociation.  C1 and C2 appear intact and aligned. No acute osseous abnormality identified. Soft tissues and spinal canal: Right tentorial and basilar cistern subdural blood redemonstrated in the visible posterior fossa. Hemorrhage likely extends to the ventral cisterna magna, and perhaps to the ventral C1-odontoid spinal canal. But there is underlying mildly hyperdense chronic ligamentous hypertrophy also about the odontoid. No convincing intra canal hemorrhage below C1. No prevertebral soft tissue swelling  or edema. Calcified carotid bifurcation atherosclerosis. Negative visible other noncontrast neck soft tissues. Disc levels: Chronic cervical spine degeneration, especially severe disc degeneration C5-C6 and C6-C7 appears stable from last year. Up to mild multifactorial cervical spinal stenosis. Upper chest: Visible upper thoracic levels appear intact. Mild apical lung scarring and emphysema. IMPRESSION: 1. No acute traumatic injury identified in the cervical spine. 2. Hemorrhage from the ventral posterior fossa subdural hematoma likely tracks to the ventral cisterna magna and perhaps as far as C1. No other convincing cervical canal blood. 3. 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 4.  Emphysema (ICD10-J43.9). Electronically Signed   By: Odessa Fleming M.D.   On: 06/24/2022 05:59   CT Maxillofacial Wo Contrast  Result Date: 06/24/2022 CLINICAL DATA:  82 year old male status post fall with epistaxis. Hypertensive, 204/102. subdural hematoma. EXAM: CT MAXILLOFACIAL WITHOUT CONTRAST TECHNIQUE: Multidetector CT imaging of the maxillofacial structures was performed. Multiplanar CT image reconstructions were also generated. RADIATION DOSE REDUCTION: This exam was performed according to the departmental dose-optimization program which includes automated exposure control, adjustment of the mA and/or kV according to patient size and/or use of iterative reconstruction technique. COMPARISON:  Head CT today reported separately. Separate cervical spine CT. FINDINGS: Osseous: Osteopenia. Mandible appears intact and normally located. Absent maxillary dentition. Bilateral maxilla, zygoma, pterygoid, and nasal bones appear intact. Central skull base appears intact. Cervical spine detailed separately. Orbits: No orbital wall fracture. Soft tissue swelling medial canthus left orbit, somewhat contiguous with left forehead soft tissue injury. Postoperative changes to both globes. Globes and intraorbital soft tissues  appear intact. Sinuses: Previous bilateral paranasal sinus surgery. Trace retained secretions at the left maxillary ostium, layering in the left maxillary sinus. Tympanic cavities and mastoids are clear. Soft tissues: Left forehead scalp soft tissue injury detailed separately. Negative visible noncontrast larynx, pharynx, parapharyngeal spaces, retropharyngeal space, sublingual space, submandibular spaces, masticator and parotid spaces. Calcified carotid artery atherosclerosis in the neck. No upper cervical lymphadenopathy. Limited intracranial: Widespread right side and central skull base subdural hematoma as detailed separately. IMPRESSION: 1. No facial fracture identified. Medial left orbit and left forehead scalp soft tissue injury. 2. Subdural hematoma at the central skull base and in the right hemisphere detailed separately. Electronically Signed   By: Odessa Fleming M.D.   On: 06/24/2022 05:54   CT Head Wo Contrast  Addendum Date: 06/24/2022   ADDENDUM REPORT: 06/24/2022 05:46 ADDENDUM: Study discussed by telephone with Dr. Loleta Rose on 06/24/2022 at 0541 hours. Electronically Signed   By: Odessa Fleming M.D.   On: 06/24/2022 05:46   Result Date: 06/24/2022 CLINICAL DATA:  82 year old male status post fall with epistaxis. Hypertensive, 204/102. EXAM: CT HEAD WITHOUT CONTRAST TECHNIQUE: Contiguous axial images were obtained from the base of the skull through the vertex without intravenous contrast. RADIATION DOSE REDUCTION: This exam was performed according to the departmental dose-optimization program which includes automated exposure control, adjustment of the mA and/or kV according to patient size and/or use of iterative reconstruction technique. COMPARISON:  Face and cervical spine CT today reported separately. Prior head CT 06/12/2021. FINDINGS:  Brain: Mixed density but mostly hyperdense right side subdural hematoma tracking onto the tentorium varies from 5-9 mm in thickness throughout the right hemisphere.  Associated intracranial mass effect with 5 mm of leftward midline shift at the anterior septum pellucidum. Mild effacement of the right lateral ventricle. No ventriculomegaly. No IVH. But there is hyperdense subdural hematoma also tracking over the dorsal clivus, in the prepontine and pre medullary areas on series 2 images 6 and 7. But basilar cisterns remain patent. No mass effect on the brainstem. No convincing subarachnoid hemorrhage. No cerebral edema, acute cortically based infarct identified. Vascular: Calcified atherosclerosis at the skull base. No suspicious intracranial vascular hyperdensity. Skull: No fracture identified. Sinuses/Orbits: Trace fluid in the left maxillary sinus. Overall Visualized paranasal sinuses and mastoids are stable and well aerated. Other: Left forehead scalp laceration on series 3, image 25. Associated scalp hematoma there, tracking to the medial canthus of the left orbit. The globes and intraorbital soft tissues appear to remain intact. Left frontal bone and frontal sinus there appear to remain intact. Other scalp soft tissues appear stable. IMPRESSION: 1. Positive for widespread right hemisphere Subdural hematoma, tracking onto the right tentorium and also inferiorly along the dorsal clivus toward the foramen magnum. SDH is up to 9 mm in thickness. 2. Associated mild intracranial mass effect with 5 mm of leftward midline shift. 3. No other acute traumatic injury to the brain. 4. Left forehead scalp laceration and hematoma with No skull fracture identified. Electronically Signed: By: Odessa Fleming M.D. On: 06/24/2022 05:38    DISCHARGE EXAMINATION: Vitals:   07/01/22 2131 07/01/22 2142 07/02/22 0258 07/02/22 0328  BP: (!) 142/78 138/81  (!) 103/59  Pulse: 67 71  68  Resp: 19 18  17   Temp: (!) 97.5 F (36.4 C) 98.8 F (37.1 C)  98.2 F (36.8 C)  TempSrc: Oral Oral  Oral  SpO2: 100% 100%  97%  Weight:   76.2 kg   Height:       General appearance: Awake alert.  In no  distress Resp: Clear to auscultation bilaterally.  Normal effort Cardio: S1-S2 is normal regular.  No S3-S4.  No rubs murmurs or bruit GI: Abdomen is soft.  Nontender nondistended.  Bowel sounds are present normal.  No masses organomegaly     DISPOSITION: SNF  Discharge Instructions     Call MD for:  difficulty breathing, headache or visual disturbances   Complete by: As directed    Call MD for:  extreme fatigue   Complete by: As directed    Call MD for:  persistant dizziness or light-headedness   Complete by: As directed    Call MD for:  persistant nausea and vomiting   Complete by: As directed    Call MD for:  severe uncontrolled pain   Complete by: As directed    Call MD for:  temperature >100.4   Complete by: As directed    Diet - low sodium heart healthy   Complete by: As directed    Increase activity slowly   Complete by: As directed           Allergies as of 07/02/2022       Reactions   Doxazosin Other (See Comments)   Experienced a significant drop in blood pressure after receiving doxazosin.   Tetracyclines & Related Anaphylaxis   Tamsulosin    Other reaction(s): Headache Left sided headache        Medication List     STOP taking these medications  amoxicillin 500 MG tablet Commonly known as: AMOXIL   aspirin EC 81 MG tablet   diphenoxylate-atropine 2.5-0.025 MG tablet Commonly known as: LOMOTIL   HYDROcodone-acetaminophen 5-325 MG tablet Commonly known as: NORCO/VICODIN   lisinopril 5 MG tablet Commonly known as: ZESTRIL   predniSONE 10 MG tablet Commonly known as: DELTASONE       TAKE these medications    acetaminophen 325 MG tablet Commonly known as: TYLENOL Take 2 tablets (650 mg total) by mouth every 6 (six) hours as needed for mild pain, fever or headache.   atorvastatin 20 MG tablet Commonly known as: LIPITOR Take 20 mg by mouth daily.   Azelastine HCl 137 MCG/SPRAY Soln Place 1 spray into both nostrils 2 (two) times  daily.   clorazepate 7.5 MG tablet Commonly known as: TRANXENE Take 1 tablet (7.5 mg total) by mouth 2 (two) times daily as needed for anxiety (sleep). What changed:  when to take this reasons to take this   clotrimazole-betamethasone cream Commonly known as: LOTRISONE Apply 1 application. topically daily.   feeding supplement Liqd Take 237 mLs by mouth 3 (three) times daily between meals.   fluticasone 50 MCG/ACT nasal spray Commonly known as: FLONASE Place 2 sprays into both nostrils daily.   folic acid 1 MG tablet Commonly known as: FOLVITE Take 2 mg by mouth daily.   gabapentin 100 MG capsule Commonly known as: NEURONTIN Take 1 capsule (100 mg total) by mouth 2 (two) times daily.   levETIRAcetam 500 MG tablet Commonly known as: KEPPRA Take 1 tablet (500 mg total) by mouth 2 (two) times daily.   levothyroxine 100 MCG tablet Commonly known as: SYNTHROID Take 100 mcg by mouth daily.   methocarbamol 500 MG tablet Commonly known as: ROBAXIN Take 1 tablet (500 mg total) by mouth every 8 (eight) hours as needed for muscle spasms.   methotrexate 2.5 MG tablet Commonly known as: RHEUMATREX Take 12.5 mg by mouth once a week.   multivitamin with minerals Tabs tablet Take 1 tablet by mouth daily.   naphazoline-glycerin 0.012-0.25 % Soln Commonly known as: CLEAR EYES REDNESS Place 1-2 drops into both eyes 4 (four) times daily as needed for eye irritation.   pantoprazole 40 MG tablet Commonly known as: PROTONIX Take 40 mg by mouth every morning.   phenol 1.4 % Liqd Commonly known as: CHLORASEPTIC Use as directed 1 spray in the mouth or throat as needed for throat irritation / pain.   polyethylene glycol 17 g packet Commonly known as: MIRALAX / GLYCOLAX Take 17 g by mouth 2 (two) times daily.   senna-docusate 8.6-50 MG tablet Commonly known as: Senokot-S Take 2 tablets by mouth 2 (two) times daily.               Durable Medical Equipment  (From  admission, onward)           Start     Ordered   06/28/22 1319  For home use only DME Walker rolling  Once       Comments: 2 wheels  Question Answer Comment  Walker: With 5 Inch Wheels   Patient needs a walker to treat with the following condition Ambulatory dysfunction      06/28/22 1318   06/27/22 1108  For home use only DME Walker rolling  Once       Question Answer Comment  Walker: With 5 Inch Wheels   Patient needs a walker to treat with the following condition Weakness generalized  06/27/22 1107              Contact information for follow-up providers     Venetia Night, MD Follow up.   Specialty: Neurosurgery Why: office to schedule follow up Contact information: [address] [place] [address] [phone]              Contact information for after-discharge care     Destination     [facility] .   Service: Skilled Paramedic information: [address] [place] [address] [phone]                     TOTAL DISCHARGE TIME: 35 minutes  Choua Chalker Rito Ehrlich  [facility] Pager on [email or web address]  07/02/2022, 9:11 AM

## 2022-07-02 NOTE — TOC Transition Note (Signed)
Transition of Care [facility]) - CM/SW Discharge Note   Patient Details  Name: Mitchell Ayers MRN: [id number] Date of Birth: [birth year]  Transition of Care [facility]) CM/SW Contact:  Allena Katz, LCSW Phone Number: 07/02/2022, 9:26 AM   Clinical Narrative:   Pt has orders to discharge to [facility] today. DC summary sent medical necessity printed to unit. CSW spoke with daughter [name] to inform her that patient will be moved today and she is agreeable. Penny with [facility] also notified.   Final next level of care: Skilled Nursing Facility Barriers to Discharge: Barriers Resolved   Patient Goals and CMS Choice CMS Medicare.gov Compare Post Acute Care list provided to:: Patient Represenative (must comment) (theresa daughter) Choice offered to / list presented to :  (daughter theresa)  Discharge Placement PASRR number recieved: 06/25/22 PASRR number recieved: 06/25/22            Patient chooses bed at:  ([facility]) Patient to be transferred to facility by: acems Name of family member notified: theresa Patient and family notified of of transfer: 07/02/22  Discharge Plan and Services Additional resources added to the After Visit Summary for                                       Social Determinants of Health (SDOH) Interventions SDOH Screenings   Food Insecurity: No Food Insecurity (06/25/2022)  Housing: Low Risk  (06/25/2022)  Transportation Needs: No Transportation Needs (06/25/2022)  Utilities: Not At Risk (06/25/2022)  Tobacco Use: Medium Risk (06/26/2022)     Readmission Risk Interventions     No data to display

## 2022-07-08 ENCOUNTER — Encounter: Payer: Medicare HMO | Admitting: Orthopedic Surgery

## 2022-07-24 ENCOUNTER — Ambulatory Visit: Payer: Medicare HMO | Admitting: Urology

## 2022-07-24 ENCOUNTER — Encounter: Payer: Self-pay | Admitting: Urology

## 2022-07-24 VITALS — BP 129/68 | HR 71 | Ht 67.0 in

## 2022-07-24 DIAGNOSIS — N401 Enlarged prostate with lower urinary tract symptoms: Secondary | ICD-10-CM

## 2022-07-24 DIAGNOSIS — R339 Retention of urine, unspecified: Secondary | ICD-10-CM

## 2022-07-24 DIAGNOSIS — R351 Nocturia: Secondary | ICD-10-CM | POA: Diagnosis not present

## 2022-07-24 DIAGNOSIS — Z466 Encounter for fitting and adjustment of urinary device: Secondary | ICD-10-CM

## 2022-07-24 DIAGNOSIS — N138 Other obstructive and reflux uropathy: Secondary | ICD-10-CM

## 2022-07-24 LAB — BLADDER SCAN AMB NON-IMAGING

## 2022-07-24 MED ORDER — FINASTERIDE 5 MG PO TABS
5.0000 mg | ORAL_TABLET | Freq: Every day | ORAL | 11 refills | Status: DC
Start: 2022-07-24 — End: 2023-01-22

## 2022-07-24 MED ORDER — SULFAMETHOXAZOLE-TRIMETHOPRIM 800-160 MG PO TABS
1.0000 | ORAL_TABLET | Freq: Once | ORAL | Status: AC
Start: 2022-07-24 — End: 2022-07-24
  Administered 2022-07-24: 1 via ORAL

## 2022-07-24 NOTE — Progress Notes (Signed)
Patient ID: Mitchell Ayers, male   DOB: 03/13/1940, 82 y.o.   MRN: [id number] Catheter Removal  Patient is present today for a catheter removal.  8ml of water was drained from the balloon. A 14FR foley cath was removed from the bladder, no complications were noted. Patient tolerated well.  Performed by: Mervin Hack, CMA  Follow up 07/24/22 @ 3:45

## 2022-07-24 NOTE — Patient Instructions (Signed)

## 2022-07-24 NOTE — Progress Notes (Signed)
   07/24/22 1:08 PM   Mitchell Ayers 1940-02-26 [id number]  CC: Urinary retention  HPI: 82 year old male who presents with Foley catheter in place after recent prolonged hospitalization for a fall that required a neurosurgery procedure for subdural hematoma.  He did have urinary symptoms prior to this event with nocturia 5-6 times per night, weak stream, and frequency.  Has never seen a urologist before.  He was trialed on doxazosin inpatient but had hypotension, and he did not tolerate Flomax secondary to headache.  He is bothered by the catheter and would like it removed.    PMH: Past Medical History:  Diagnosis Date   Anxiety    HLD (hyperlipidemia)    HTN (hypertension)    Hypothyroid    Normocytic anemia    RA (rheumatoid arthritis) (HCC)     Surgical History: Past Surgical History:  Procedure Laterality Date   CHOLECYSTECTOMY     CRANIOTOMY Right 06/24/2022   Procedure: CRANIOTOMY HEMATOMA EVACUATION SUBDURAL;  Surgeon: Venetia Night, MD;  Location: [facility] ORS;  Service: Neurosurgery;  Laterality: Right;   HIP ARTHROPLASTY Right 06/13/2021   Procedure: ARTHROPLASTY BIPOLAR HIP (HEMIARTHROPLASTY);  Surgeon: Ross Marcus, MD;  Location: [facility] ORS;  Service: Orthopedics;  Laterality: Right;   Left knee surgery      Family History: Family History  Problem Relation Age of Onset   Hypertension Mother    Parkinson's disease Father    Stomach cancer Father     Social History:  reports that he has quit smoking. His smoking use included cigarettes. He has never been exposed to tobacco smoke. He has never used smokeless tobacco. He reports that he does not currently use alcohol. He reports that he does not use drugs.  Physical Exam: BP 129/68 (BP Location: Left Arm, Patient Position: Sitting, Cuff Size: Normal)   Pulse 71   Ht 5\' 7"  (1.702 m)   BMI 26.31 kg/m    Constitutional:  Alert and oriented, No acute distress. Cardiovascular: No clubbing, cyanosis, or  edema. Respiratory: Normal respiratory effort, no increased work of breathing. GI: Abdomen is soft, nontender, nondistended, no abdominal masses   Assessment & Plan:   Frail 82 year old male with baseline urinary symptoms of nocturia 5-6 times per night, recently hospitalized after a fall that required surgical intervention for a subdural hematoma, has had urinary retention during that hospitalization that required Foley catheter.  He was unable to tolerate doxazosin secondary to hypotension, and Flomax secondary to headache.  He was amenable to starting finasteride to try to improve his urinary symptoms, and we discussed this can take up to 6 months to see improvement.  His Foley catheter was removed today, he has been able to urinate with a stream, and PVR this afternoon is normal at 86ml.  Return precautions were discussed at length including UTI symptoms, gross hematuria, inability to urinate, or fever.  Start finasteride RTC 6 months PVR   Legrand Rams, MD 07/24/2022  [facility] [address], [address] [place], [place] [address] [phone]

## 2022-07-26 ENCOUNTER — Ambulatory Visit
Admission: RE | Admit: 2022-07-26 | Discharge: 2022-07-26 | Disposition: A | Discharge status: Home / Self Care | Payer: Medicare HMO | Source: Ambulatory Visit | Attending: Neurosurgery | Admitting: Neurosurgery

## 2022-07-26 DIAGNOSIS — S065XAA Traumatic subdural hemorrhage with loss of consciousness status unknown, initial encounter: Secondary | ICD-10-CM | POA: Insufficient documentation

## 2022-07-29 ENCOUNTER — Telehealth: Payer: Self-pay | Admitting: Neurosurgery

## 2022-07-29 NOTE — Telephone Encounter (Signed)
Anti-seizure medication levETIRAcetam (KEPPRA) 500 MG tablet  when he was released from the rehab facility with no instructions on how long he is supposed to be taking this. Says he will run out tomorrow but doesn't know if he needs to continue taking this/ get a refill. Says that he does not want to have a seizure but didn't have any further instructions other than take it 2x daily.   [facility]

## 2022-07-29 NOTE — Telephone Encounter (Signed)
I spoke with the patient and notified him that he can stop taking keppra per CKY. He verbalized understanding.

## 2022-08-01 ENCOUNTER — Ambulatory Visit (INDEPENDENT_AMBULATORY_CARE_PROVIDER_SITE_OTHER): Payer: Medicare HMO | Admitting: Neurosurgery

## 2022-08-01 ENCOUNTER — Encounter: Payer: Self-pay | Admitting: Neurosurgery

## 2022-08-01 VITALS — BP 151/88 | HR 72 | Temp 97.7°F | Wt 146.6 lb

## 2022-08-01 DIAGNOSIS — S065X9D Traumatic subdural hemorrhage with loss of consciousness of unspecified duration, subsequent encounter: Secondary | ICD-10-CM

## 2022-08-01 DIAGNOSIS — W19XXXD Unspecified fall, subsequent encounter: Secondary | ICD-10-CM

## 2022-08-01 DIAGNOSIS — S065XAA Traumatic subdural hemorrhage with loss of consciousness status unknown, initial encounter: Secondary | ICD-10-CM

## 2022-08-01 NOTE — Progress Notes (Signed)
   DOS: 06/24/2022 (R crani for subdural hematoma)  HISTORY OF PRESENT ILLNESS: 08/01/2022 [name] is status post craniotomy for subdural hematoma.  He is doing very well.  He denies headaches or nausea.Marland Kitchen   PHYSICAL EXAMINATION:   Vitals:   08/01/22 1322  BP: (!) 151/88  Pulse: 72  Temp: 97.7 F (36.5 C)   General: Patient is well developed, well nourished, calm, collected, and in no apparent distress.  NEUROLOGICAL:  General: In no acute distress.  Awake, alert, oriented to person, place, and time. Pupils equal round and reactive to light.   Strength: Side Biceps Triceps Deltoid Interossei Grip Wrist Ext. Wrist Flex.  R 5 5 5 5 5 5 5   L 5 5 5 5 5 5 5    Incision c/d/i   ROS (Neurologic): Negative except as noted above  IMAGING: CT Head 07/26/2022 IMPRESSION: 1. Right frontal fluid collection underlying the craniotomy is similar in size. This now appears more low-attenuation. Air has resolved in the interval. Local mass effect persists with 2 mm of midline shift to the left. 2. Right tentorial subdural hematoma has decreased in size and is now low-density. 3. No new acute areas of hemorrhage are identified.     Electronically Signed   By: Darliss Cheney M.D.   On: 07/26/2022 18:18  ASSESSMENT/PLAN:  Mitchell Ayers is doing well after craniotomy for subdural hematoma.  I am very pleased with his response.  He is released to slowly increase his activities over time.  I have released him to begin driving.  I will see him back on an as-needed basis.  I encouraged him to set up an appointment with his primary care doctor so that she is well-informed about his recent surgery.  I spent a total of 15 minutes in this patient's care today. This time was spent reviewing pertinent records including imaging studies, obtaining and confirming history, performing a directed evaluation, formulating and discussing my recommendations, and documenting the visit within the medical  record.    Venetia Night MD, [facility] Department of Neurosurgery

## 2022-08-06 ENCOUNTER — Ambulatory Visit: Payer: Medicare HMO | Admitting: Urology

## 2022-08-29 ENCOUNTER — Encounter: Payer: Self-pay | Admitting: Oncology

## 2022-08-29 ENCOUNTER — Inpatient Hospital Stay: Payer: Medicare HMO

## 2022-08-29 ENCOUNTER — Inpatient Hospital Stay: Payer: Medicare HMO | Attending: Oncology | Admitting: Oncology

## 2022-08-29 VITALS — BP 124/73 | HR 81 | Temp 99.1°F | Resp 14 | Ht 67.0 in | Wt 148.8 lb

## 2022-08-29 DIAGNOSIS — D649 Anemia, unspecified: Secondary | ICD-10-CM | POA: Diagnosis present

## 2022-08-29 DIAGNOSIS — R531 Weakness: Secondary | ICD-10-CM | POA: Insufficient documentation

## 2022-08-29 DIAGNOSIS — Z800 Family history of malignant neoplasm of digestive organs: Secondary | ICD-10-CM | POA: Insufficient documentation

## 2022-08-29 DIAGNOSIS — Z880 Allergy status to penicillin: Secondary | ICD-10-CM | POA: Diagnosis not present

## 2022-08-29 DIAGNOSIS — Z9049 Acquired absence of other specified parts of digestive tract: Secondary | ICD-10-CM | POA: Insufficient documentation

## 2022-08-29 DIAGNOSIS — Z820 Family history of epilepsy and other diseases of the nervous system: Secondary | ICD-10-CM | POA: Insufficient documentation

## 2022-08-29 DIAGNOSIS — M2550 Pain in unspecified joint: Secondary | ICD-10-CM | POA: Diagnosis not present

## 2022-08-29 DIAGNOSIS — Z87891 Personal history of nicotine dependence: Secondary | ICD-10-CM | POA: Insufficient documentation

## 2022-08-29 DIAGNOSIS — Z881 Allergy status to other antibiotic agents status: Secondary | ICD-10-CM | POA: Diagnosis not present

## 2022-08-29 DIAGNOSIS — R5382 Chronic fatigue, unspecified: Secondary | ICD-10-CM | POA: Diagnosis not present

## 2022-08-29 DIAGNOSIS — Z8249 Family history of ischemic heart disease and other diseases of the circulatory system: Secondary | ICD-10-CM | POA: Diagnosis not present

## 2022-08-29 DIAGNOSIS — Z79899 Other long term (current) drug therapy: Secondary | ICD-10-CM | POA: Diagnosis not present

## 2022-08-29 DIAGNOSIS — M069 Rheumatoid arthritis, unspecified: Secondary | ICD-10-CM | POA: Insufficient documentation

## 2022-08-29 DIAGNOSIS — Z7901 Long term (current) use of anticoagulants: Secondary | ICD-10-CM | POA: Insufficient documentation

## 2022-08-29 LAB — CBC (CANCER CENTER ONLY)
HCT: 31.4 % — ABNORMAL LOW (ref 39.0–52.0)
Hemoglobin: 9.8 g/dL — ABNORMAL LOW (ref 13.0–17.0)
MCH: 30.5 pg (ref 26.0–34.0)
MCHC: 31.2 g/dL (ref 30.0–36.0)
MCV: 97.8 fL (ref 80.0–100.0)
Platelet Count: 320 10*3/uL (ref 150–400)
RBC: 3.21 MIL/uL — ABNORMAL LOW (ref 4.22–5.81)
RDW: 14.6 % (ref 11.5–15.5)
WBC Count: 7.7 10*3/uL (ref 4.0–10.5)
nRBC: 0 % (ref 0.0–0.2)

## 2022-08-29 LAB — FERRITIN: Ferritin: 70 ng/mL (ref 24–336)

## 2022-08-29 LAB — IRON AND TIBC
Iron: 47 ug/dL (ref 45–182)
Saturation Ratios: 19 % (ref 17.9–39.5)
TIBC: 244 ug/dL — ABNORMAL LOW (ref 250–450)
UIBC: 197 ug/dL

## 2022-08-29 LAB — FOLATE: Folate: >40 ng/mL (ref >5.9)

## 2022-08-29 LAB — DIRECT ANTIGLOBULIN TEST (NOT AT ARMC)
DAT, IgG: NEGATIVE
DAT, complement: NEGATIVE

## 2022-08-29 LAB — VITAMIN B12: Vitamin B-12: 428 pg/mL (ref 180–914)

## 2022-08-29 LAB — LACTATE DEHYDROGENASE: LDH: 161 U/L (ref 98–192)

## 2022-08-29 NOTE — Progress Notes (Signed)
[facility]  [phone] [phone]  ID: Mitchell Ayers OB: May 29, 1940  MR#: [id number]  QIO#:[id number]  Patient Care Team: Marlan Palau as PCP - General  CHIEF COMPLAINT: Anemia, unspecified.  INTERVAL HISTORY: Patient is an 82 year old male who was noted to have a persistently decreased hemoglobin on routine blood work.  He was referred to clinic today for further evaluation.  He has chronic weakness and fatigue, but otherwise feels well.  He has no neurologic complaints.  He denies any recent fevers or illnesses.  He has a good appetite and denies weight loss.  He has no chest pain, shortness of breath, cough, or hemoptysis.  He denies any nausea, vomiting, constipation, or diarrhea.  He has no urinary complaints.  Patient offers no further specific complaints today.  REVIEW OF SYSTEMS:   Review of Systems  Constitutional:  Positive for malaise/fatigue. Negative for fever and weight loss.  Respiratory: Negative.  Negative for cough, hemoptysis and shortness of breath.   Cardiovascular: Negative.  Negative for chest pain and leg swelling.  Gastrointestinal: Negative.  Negative for abdominal pain, blood in stool and melena.  Genitourinary: Negative.  Negative for hematuria.  Musculoskeletal:  Positive for joint pain. Negative for back pain.  Skin: Negative.  Negative for rash.  Neurological:  Positive for weakness. Negative for dizziness, focal weakness and headaches.  Psychiatric/Behavioral: Negative.  The patient is not nervous/anxious.     As per HPI. Otherwise, a complete review of systems is negative.  PAST MEDICAL HISTORY: Past Medical History:  Diagnosis Date   Anxiety    HLD (hyperlipidemia)    HTN (hypertension)    Hypothyroid    Normocytic anemia    RA (rheumatoid arthritis) (HCC)     PAST SURGICAL HISTORY: Past Surgical History:  Procedure Laterality Date   CHOLECYSTECTOMY     CRANIOTOMY Right 06/24/2022   Procedure:  CRANIOTOMY HEMATOMA EVACUATION SUBDURAL;  Surgeon: Venetia Night, MD;  Location: [facility] ORS;  Service: Neurosurgery;  Laterality: Right;   HIP ARTHROPLASTY Right 06/13/2021   Procedure: ARTHROPLASTY BIPOLAR HIP (HEMIARTHROPLASTY);  Surgeon: Ross Marcus, MD;  Location: [facility] ORS;  Service: Orthopedics;  Laterality: Right;   Left knee surgery      FAMILY HISTORY: Family History  Problem Relation Age of Onset   Hypertension Mother    Parkinson's disease Father    Stomach cancer Father     ADVANCED DIRECTIVES (Y/N):  N  HEALTH MAINTENANCE: Social History   Tobacco Use   Smoking status: Former    Types: Cigarettes    Passive exposure: Never   Smokeless tobacco: Never  Substance Use Topics   Alcohol use: Not Currently   Drug use: Never     Colonoscopy:  PAP:  Bone density:  Lipid panel:  Allergies  Allergen Reactions   Doxazosin Other (See Comments)    Experienced a significant drop in blood pressure after receiving doxazosin.   Tetracycline Swelling    Other Reaction(s): Not available   Tetracyclines & Related Anaphylaxis   Amoxicillin-Pot Clavulanate     Other Reaction(s): Not available   Tamsulosin     Other reaction(s): Headache  Left sided headache  Other Reaction(s): Headache   Silicone Rash    Current Outpatient Medications  Medication Sig Dispense Refill   acetaminophen (TYLENOL) 325 MG tablet Take 2 tablets (650 mg total) by mouth every 6 (six) hours as needed for mild pain, fever or headache.     atorvastatin (LIPITOR) 20 MG tablet Take 20 mg by mouth  daily.     Azelastine HCl 137 MCG/SPRAY SOLN Place 1 spray into both nostrils 2 (two) times daily.     clorazepate (TRANXENE) 7.5 MG tablet Take 1 tablet (7.5 mg total) by mouth 2 (two) times daily as needed for anxiety (sleep). 30 tablet 0   clotrimazole-betamethasone (LOTRISONE) cream Apply 1 application. topically daily.     finasteride (PROSCAR) 5 MG tablet Take 1 tablet (5 mg total) by mouth  daily. 30 tablet 11   fluticasone (FLONASE) 50 MCG/ACT nasal spray Place 2 sprays into both nostrils daily.     folic acid (FOLVITE) 1 MG tablet Take 2 mg by mouth daily.     gabapentin (NEURONTIN) 100 MG capsule Take 1 capsule (100 mg total) by mouth 2 (two) times daily. 60 capsule 0   levothyroxine (SYNTHROID) 100 MCG tablet Take 100 mcg by mouth daily.     methotrexate (RHEUMATREX) 2.5 MG tablet Take 12.5 mg by mouth once a week.     naphazoline-glycerin (CLEAR EYES REDNESS) 0.012-0.25 % SOLN Place 1-2 drops into both eyes 4 (four) times daily as needed for eye irritation.     pantoprazole (PROTONIX) 40 MG tablet Take 40 mg by mouth every morning.     senna-docusate (SENOKOT-S) 8.6-50 MG tablet Take 2 tablets by mouth 2 (two) times daily.     apixaban (ELIQUIS) 5 MG TABS tablet Take 10 mg by mouth 2 (two) times daily. (Patient not taking: Reported on 08/29/2022)     Multiple Vitamin (MULTIVITAMIN WITH MINERALS) TABS tablet Take 1 tablet by mouth daily. (Patient not taking: Reported on 08/29/2022)     No current facility-administered medications for this visit.    OBJECTIVE: Vitals:   08/29/22 1339  BP: 124/73  Pulse: 81  Resp: 14  Temp: 99.1 F (37.3 C)  SpO2: 100%     Body mass index is 23.31 kg/m.    ECOG FS:0 - Asymptomatic  General: Well-developed, well-nourished, no acute distress. Eyes: Pink conjunctiva, anicteric sclera. HEENT: Normocephalic, moist mucous membranes. Lungs: No audible wheezing or coughing. Heart: Regular rate and rhythm. Abdomen: Soft, nontender, no obvious distention. Musculoskeletal: No edema, cyanosis, or clubbing. Neuro: Alert, answering all questions appropriately. Cranial nerves grossly intact. Skin: No rashes or petechiae noted. Psych: Normal affect. Lymphatics: No cervical, calvicular, axillary or inguinal LAD.   LAB RESULTS:  Lab Results  Component Value Date   NA 134 (L) 07/01/2022   K 4.0 07/01/2022   CL 104 07/01/2022   CO2 24  07/01/2022   GLUCOSE 100 (H) 07/01/2022   BUN 27 (H) 07/01/2022   CREATININE 1.08 07/01/2022   CALCIUM 8.3 (L) 07/01/2022   GFRNONAA >60 07/01/2022    Lab Results  Component Value Date   WBC 7.7 08/29/2022   NEUTROABS 15.8 (H) 06/25/2022   HGB 9.8 (L) 08/29/2022   HCT 31.4 (L) 08/29/2022   MCV 97.8 08/29/2022   PLT 320 08/29/2022    Lab Results  Component Value Date   IRON 47 08/29/2022   TIBC 244 (L) 08/29/2022   IRONPCTSAT 19 08/29/2022   Lab Results  Component Value Date   FERRITIN 70 08/29/2022     STUDIES: No results found.  ASSESSMENT: Anemia, unspecified.  PLAN:    Anemia, unspecified: Patient's hemoglobin is mildly decreased at 9.8, but relatively stable.  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  Iron stores, B12, and folate levels are within normal limits.  He has no evidence of hemolysis.  Erythropoietin  levels are inappropriately normal.  SPEP and [employer] myeloid panel were drawn for completeness and are pending at time of dictation.  It is possible patient has mild bone marrow suppression from chronic inflammation from rheumatoid arthritis and treatment with methotrexate.  He does not require bone marrow biopsy at this time, but would consider 1 in the future if he became more symptomatic or develops other cytopenias.  No intervention is needed at this time.  Return to clinic in 3 weeks for further evaluation and discussion of his laboratory results.  I spent a total of 45 minutes reviewing chart data, face-to-face evaluation with the patient, counseling and coordination of care as detailed above.   Patient expressed understanding and was in agreement with this plan. He also understands that He can call clinic at any time with any questions, concerns, or complaints.     Jeralyn Ruths, MD   08/30/2022 10:00 AM

## 2022-08-30 LAB — ERYTHROPOIETIN: Erythropoietin: 9.8 m[IU]/mL (ref 2.6–18.5)

## 2022-08-30 LAB — HAPTOGLOBIN: Haptoglobin: 91 mg/dL (ref 38–329)

## 2022-09-01 LAB — PROTEIN ELECTROPHORESIS, SERUM
A/G Ratio: 1.4 (ref 0.7–1.7)
Albumin ELP: 3.5 g/dL (ref 2.9–4.4)
Alpha-1-Globulin: 0.2 g/dL (ref 0.0–0.4)
Alpha-2-Globulin: 0.6 g/dL (ref 0.4–1.0)
Beta Globulin: 0.8 g/dL (ref 0.7–1.3)
Gamma Globulin: 0.8 g/dL (ref 0.4–1.8)
Globulin, Total: 2.5 g/dL (ref 2.2–3.9)
Total Protein ELP: 6 g/dL (ref 6.0–8.5)

## 2022-09-10 LAB — INTELLIGEN MYELOID

## 2022-09-16 ENCOUNTER — Encounter: Payer: Self-pay | Admitting: Oncology

## 2022-09-16 ENCOUNTER — Inpatient Hospital Stay: Payer: Medicare HMO | Attending: Oncology | Admitting: Oncology

## 2022-09-16 VITALS — BP 134/64 | HR 67 | Temp 97.1°F | Resp 16 | Ht 67.0 in | Wt 147.0 lb

## 2022-09-16 DIAGNOSIS — Z79890 Hormone replacement therapy (postmenopausal): Secondary | ICD-10-CM | POA: Insufficient documentation

## 2022-09-16 DIAGNOSIS — Z79899 Other long term (current) drug therapy: Secondary | ICD-10-CM | POA: Insufficient documentation

## 2022-09-16 DIAGNOSIS — D649 Anemia, unspecified: Secondary | ICD-10-CM | POA: Diagnosis not present

## 2022-09-16 DIAGNOSIS — R531 Weakness: Secondary | ICD-10-CM | POA: Diagnosis not present

## 2022-09-16 DIAGNOSIS — M2550 Pain in unspecified joint: Secondary | ICD-10-CM | POA: Diagnosis not present

## 2022-09-16 DIAGNOSIS — Z87891 Personal history of nicotine dependence: Secondary | ICD-10-CM | POA: Insufficient documentation

## 2022-09-16 DIAGNOSIS — Z7901 Long term (current) use of anticoagulants: Secondary | ICD-10-CM | POA: Diagnosis not present

## 2022-09-16 DIAGNOSIS — Z818 Family history of other mental and behavioral disorders: Secondary | ICD-10-CM | POA: Insufficient documentation

## 2022-09-16 DIAGNOSIS — R5383 Other fatigue: Secondary | ICD-10-CM | POA: Diagnosis not present

## 2022-09-16 DIAGNOSIS — Z9049 Acquired absence of other specified parts of digestive tract: Secondary | ICD-10-CM | POA: Insufficient documentation

## 2022-09-16 DIAGNOSIS — Z880 Allergy status to penicillin: Secondary | ICD-10-CM | POA: Diagnosis not present

## 2022-09-16 DIAGNOSIS — Z881 Allergy status to other antibiotic agents status: Secondary | ICD-10-CM | POA: Insufficient documentation

## 2022-09-16 DIAGNOSIS — Z8249 Family history of ischemic heart disease and other diseases of the circulatory system: Secondary | ICD-10-CM | POA: Diagnosis not present

## 2022-09-16 DIAGNOSIS — Z800 Family history of malignant neoplasm of digestive organs: Secondary | ICD-10-CM | POA: Diagnosis not present

## 2022-09-16 NOTE — Progress Notes (Signed)
[facility]  [phone] Fax:(336) [phone]  ID: Mitchell Ayers OB: 09-28-1940  MR#: [id number]  WNU#:[id number]  Patient Care Team: Marlan Palau as PCP - General  CHIEF COMPLAINT: Anemia, unspecified.  INTERVAL HISTORY: Patient returns to clinic today for further evaluation and discussion of his laboratory results.  He continues to have chronic weakness and fatigue, but otherwise feels well. He has no neurologic complaints.  He denies any recent fevers or illnesses.  He has a good appetite and denies weight loss.  He has no chest pain, shortness of breath, cough, or hemoptysis.  He denies any nausea, vomiting, constipation, or diarrhea.  He has no melena or hematochezia.  He has no urinary complaints.  Patient offers no further specific complaints today.  REVIEW OF SYSTEMS:   Review of Systems  Constitutional:  Positive for malaise/fatigue. Negative for fever and weight loss.  Respiratory: Negative.  Negative for cough, hemoptysis and shortness of breath.   Cardiovascular: Negative.  Negative for chest pain and leg swelling.  Gastrointestinal: Negative.  Negative for abdominal pain, blood in stool and melena.  Genitourinary: Negative.  Negative for hematuria.  Musculoskeletal:  Positive for joint pain. Negative for back pain.  Skin: Negative.  Negative for rash.  Neurological:  Positive for weakness. Negative for dizziness, focal weakness and headaches.  Psychiatric/Behavioral: Negative.  The patient is not nervous/anxious.     As per HPI. Otherwise, a complete review of systems is negative.  PAST MEDICAL HISTORY: Past Medical History:  Diagnosis Date   Anxiety    HLD (hyperlipidemia)    HTN (hypertension)    Hypothyroid    Normocytic anemia    RA (rheumatoid arthritis) (HCC)     PAST SURGICAL HISTORY: Past Surgical History:  Procedure Laterality Date   CHOLECYSTECTOMY     CRANIOTOMY Right 06/24/2022   Procedure: CRANIOTOMY HEMATOMA  EVACUATION SUBDURAL;  Surgeon: Venetia Night, MD;  Location: [facility] ORS;  Service: Neurosurgery;  Laterality: Right;   HIP ARTHROPLASTY Right 06/13/2021   Procedure: ARTHROPLASTY BIPOLAR HIP (HEMIARTHROPLASTY);  Surgeon: Ross Marcus, MD;  Location: [facility] ORS;  Service: Orthopedics;  Laterality: Right;   Left knee surgery      FAMILY HISTORY: Family History  Problem Relation Age of Onset   Hypertension Mother    Parkinson's disease Father    Stomach cancer Father     ADVANCED DIRECTIVES (Y/N):  N  HEALTH MAINTENANCE: Social History   Tobacco Use   Smoking status: Former    Types: Cigarettes    Passive exposure: Never   Smokeless tobacco: Never  Substance Use Topics   Alcohol use: Not Currently   Drug use: Never     Colonoscopy:  PAP:  Bone density:  Lipid panel:  Allergies  Allergen Reactions   Doxazosin Other (See Comments)    Experienced a significant drop in blood pressure after receiving doxazosin.   Tetracycline Swelling    Other Reaction(s): Not available   Tetracyclines & Related Anaphylaxis   Amoxicillin-Pot Clavulanate     Other Reaction(s): Not available   Tamsulosin     Other reaction(s): Headache  Left sided headache  Other Reaction(s): Headache   Silicone Rash    Current Outpatient Medications  Medication Sig Dispense Refill   acetaminophen (TYLENOL) 325 MG tablet Take 2 tablets (650 mg total) by mouth every 6 (six) hours as needed for mild pain, fever or headache.     atorvastatin (LIPITOR) 20 MG tablet Take 20 mg by mouth daily.     Azelastine  HCl 137 MCG/SPRAY SOLN Place 1 spray into both nostrils 2 (two) times daily.     clorazepate (TRANXENE) 7.5 MG tablet Take 1 tablet (7.5 mg total) by mouth 2 (two) times daily as needed for anxiety (sleep). 30 tablet 0   clotrimazole-betamethasone (LOTRISONE) cream Apply 1 application. topically daily.     finasteride (PROSCAR) 5 MG tablet Take 1 tablet (5 mg total) by mouth daily. 30 tablet 11    fluticasone (FLONASE) 50 MCG/ACT nasal spray Place 2 sprays into both nostrils daily.     folic acid (FOLVITE) 1 MG tablet Take 2 mg by mouth daily.     gabapentin (NEURONTIN) 100 MG capsule Take 1 capsule (100 mg total) by mouth 2 (two) times daily. 60 capsule 0   levothyroxine (SYNTHROID) 100 MCG tablet Take 100 mcg by mouth daily.     methotrexate (RHEUMATREX) 2.5 MG tablet Take 12.5 mg by mouth once a week.     naphazoline-glycerin (CLEAR EYES REDNESS) 0.012-0.25 % SOLN Place 1-2 drops into both eyes 4 (four) times daily as needed for eye irritation.     pantoprazole (PROTONIX) 40 MG tablet Take 40 mg by mouth every morning.     senna-docusate (SENOKOT-S) 8.6-50 MG tablet Take 2 tablets by mouth 2 (two) times daily.     apixaban (ELIQUIS) 5 MG TABS tablet Take 10 mg by mouth 2 (two) times daily. (Patient not taking: Reported on 08/29/2022)     Multiple Vitamin (MULTIVITAMIN WITH MINERALS) TABS tablet Take 1 tablet by mouth daily. (Patient not taking: Reported on 08/29/2022)     No current facility-administered medications for this visit.    OBJECTIVE: Vitals:   09/16/22 1300  BP: 134/64  Pulse: 67  Resp: 16  Temp: (!) 97.1 F (36.2 C)  SpO2: 100%     Body mass index is 23.02 kg/m.    ECOG FS:0 - Asymptomatic  General: Well-developed, well-nourished, no acute distress. Eyes: Pink conjunctiva, anicteric sclera. HEENT: Normocephalic, moist mucous membranes. Lungs: No audible wheezing or coughing. Heart: Regular rate and rhythm. Abdomen: Soft, nontender, no obvious distention. Musculoskeletal: No edema, cyanosis, or clubbing. Neuro: Alert, answering all questions appropriately. Cranial nerves grossly intact. Skin: No rashes or petechiae noted. Psych: Normal affect.  LAB RESULTS:  Lab Results  Component Value Date   NA 134 (L) 07/01/2022   K 4.0 07/01/2022   CL 104 07/01/2022   CO2 24 07/01/2022   GLUCOSE 100 (H) 07/01/2022   BUN 27 (H) 07/01/2022   CREATININE 1.08  07/01/2022   CALCIUM 8.3 (L) 07/01/2022   GFRNONAA >60 07/01/2022    Lab Results  Component Value Date   WBC 7.7 08/29/2022   NEUTROABS 15.8 (H) 06/25/2022   HGB 9.8 (L) 08/29/2022   HCT 31.4 (L) 08/29/2022   MCV 97.8 08/29/2022   PLT 320 08/29/2022    Lab Results  Component Value Date   IRON 47 08/29/2022   TIBC 244 (L) 08/29/2022   IRONPCTSAT 19 08/29/2022   Lab Results  Component Value Date   FERRITIN 70 08/29/2022     STUDIES: No results found.  ASSESSMENT: Anemia, unspecified.  PLAN:    Anemia, unspecified: Upon review of patient's chart, he is noted to have a chronic, but stable decrease of his hemoglobin ranging from 7.9-11.0 since at least June 2022.  His most recent result is 9.8 which is approximately his baseline.  Previously, iron stores, B12, and folate levels were within normal limits.  He has no evidence of hemolysis.  Erythropoietin levels are inappropriately normal.  SPEP is negative and [employer] myeloid panel revealed a variant of unknown significance.  It is possible patient has mild bone marrow suppression from chronic inflammation from rheumatoid arthritis and treatment with methotrexate.  He does not require bone marrow biopsy at this time.  No intervention is needed.  After discussion with the patient and his wife, it was agreed upon that no further follow-up is necessary.  Please continue to monitor patient's CBC 2-3 times per year and if his hemoglobin falls persistently below 8.0, please refer him back for further evaluation.     I spent a total of 20 minutes reviewing chart data, face-to-face evaluation with the patient, counseling and coordination of care as detailed above.    Patient expressed understanding and was in agreement with this plan. He also understands that He can call clinic at any time with any questions, concerns, or complaints.     Jeralyn Ruths, MD   09/16/2022 4:04 PM

## 2022-11-08 ENCOUNTER — Telehealth: Payer: Self-pay | Admitting: Neurosurgery

## 2022-11-08 NOTE — Telephone Encounter (Signed)
crani for SDH on 06/24/22  He has not been diagnosed with Dementia but it runs in his family. If he ever has to take medication for Dementia will he be at risk since he had brain surgery?

## 2022-11-11 NOTE — Telephone Encounter (Signed)
Patient notified pr Myer Haff it is ok to take medication for Dementia if it is ever needed.

## 2022-12-19 ENCOUNTER — Other Ambulatory Visit
Admission: RE | Admit: 2022-12-19 | Discharge: 2022-12-19 | Disposition: A | Discharge status: Home / Self Care | Payer: Medicare HMO | Source: Ambulatory Visit | Attending: Rheumatology | Admitting: Rheumatology

## 2022-12-19 DIAGNOSIS — M0579 Rheumatoid arthritis with rheumatoid factor of multiple sites without organ or systems involvement: Secondary | ICD-10-CM | POA: Diagnosis present

## 2022-12-19 LAB — SYNOVIAL CELL COUNT + DIFF, W/ CRYSTALS
Crystals, Fluid: NONE SEEN
Eosinophils-Synovial: 0 %
Lymphocytes-Synovial Fld: 36 %
Monocyte-Macrophage-Synovial Fluid: 54 %
Neutrophil, Synovial: 10 %
WBC, Synovial: 1487 /mm3 — ABNORMAL HIGH (ref 0–200)

## 2023-01-21 ENCOUNTER — Other Ambulatory Visit: Payer: Self-pay

## 2023-01-21 DIAGNOSIS — N138 Other obstructive and reflux uropathy: Secondary | ICD-10-CM

## 2023-01-22 ENCOUNTER — Ambulatory Visit: Payer: Medicare HMO | Admitting: Urology

## 2023-01-22 VITALS — BP 153/73 | HR 67 | Ht 66.0 in | Wt 153.0 lb

## 2023-01-22 DIAGNOSIS — R339 Retention of urine, unspecified: Secondary | ICD-10-CM

## 2023-01-22 DIAGNOSIS — N138 Other obstructive and reflux uropathy: Secondary | ICD-10-CM

## 2023-01-22 LAB — BLADDER SCAN AMB NON-IMAGING

## 2023-01-22 MED ORDER — FINASTERIDE 5 MG PO TABS: 5.0000 mg | ORAL_TABLET | Freq: Every day | ORAL | 11 refills | Status: AC

## 2023-01-22 NOTE — Progress Notes (Signed)
   01/22/2023 12:00 PM   Diane Bundren 03-15-1940 [id number]  Reason for visit: Follow up urinary retention  HPI: 83 year old male who I originally met in July 2024 for voiding trial after having a Foley catheter placed after a prolonged hospitalization for a fall with subdural hematoma requiring a neurosurgery procedure.  He had significant urinary symptoms prior to that hospitalization with nocturia 5-6 times per night, weak stream, and frequency.  Inpatient he was trialed on both doxazosin  and Flomax, but had side effects respectively of hypotension and headache.  His Foley catheter was removed at that visit in July 2024 and he passed a voiding trial with a PVR of 86ml.  He opted to start finasteride  at that time.  Since then, he has been doing very well.  He thinks his urination has improved significantly, and he denies any significant urinary complaints aside from nocturia 1-3 times overnight.  No problems with dysuria, gross hematuria, or infections, and PVR today is normal at 27ml.  Continue finasteride , refilled RTC 1 year PVR, if doing well at that time can follow-up with PCP to fill finasteride    Lawerence Pressman, MD  [facility] [address] [place], [place] [address] [phone]

## 2023-11-27 ENCOUNTER — Encounter: Payer: Self-pay | Admitting: Urology

## 2024-01-18 NOTE — Progress Notes (Unsigned)
 "    01/19/2024 5:50 PM   Mitchell Ayers [birth year] [id number]  Referring provider: No referring provider defined for this encounter.  Urological history: 1. Urinary retention - occurred after prolonged hospitalization for a fall that required neurosurgery for subdural hematoma - passed voiding trial  2. 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   HPI: Mitchell Ayers is a 84 y.o. man who presents today for one year follow up.  Previous records reviewed.  I PSS ***  He reports sensation of incomplete bladder emptying,   urinary frequency,   urinary intermittency,   urinary urgency,   a weak urinary stream,   having to strain to void,   nocturia x ***,   leaking before being able to reach the restroom,   leaking with coughing,   leaking without awareness,   and post void dribbling.     He is wearing *** pads//depends  daily.    Patient denies any modifying or aggravating factors.  Patient denies any recent UTI's, gross hematuria, dysuria or suprapubic/flank pain.  Patient denies any fevers, chills, nausea or vomiting.  ***  PVR***  Serum creatinine (12/2023) 1.2, eGFR 59  Hemoglobin A1c (01/2024) 6.1  BPH meds: finasteride  5 mg daily   PMH: Past Medical History:  Diagnosis Date   Anxiety    HLD (hyperlipidemia)    HTN (hypertension)    Hypothyroid    Normocytic anemia    RA (rheumatoid arthritis) (HCC)     Surgical History: Past Surgical History:  Procedure Laterality Date   CHOLECYSTECTOMY     CRANIOTOMY Right 06/24/2022   Procedure: CRANIOTOMY HEMATOMA EVACUATION SUBDURAL;  Surgeon: Clois Fret, MD;  Location: [facility] ORS;  Service: Neurosurgery;  Laterality: Right;   HIP ARTHROPLASTY Right 06/13/2021   Procedure: ARTHROPLASTY BIPOLAR HIP (HEMIARTHROPLASTY);  Surgeon: Rollene Cough, MD;  Location: [facility] ORS;  Service: Orthopedics;  Laterality: Right;   Left knee surgery       Home Medications:  Allergies as of 01/19/2024       Reactions   Doxazosin  Other (See Comments)   Experienced a significant drop in blood pressure after receiving doxazosin .   Tetracycline Swelling   Other Reaction(s): Not available   Tetracyclines & Related Anaphylaxis   Amoxicillin-pot Clavulanate    Other Reaction(s): Not available   Tamsulosin    Other reaction(s): Headache Left sided headache Other Reaction(s): Headache   Silicone Rash        Medication List        Accurate as of January 18, 2024  5:50 PM. If you have any questions, ask your nurse or doctor.          acetaminophen  325 MG tablet Commonly known as: TYLENOL  Take 2 tablets (650 mg total) by mouth every 6 (six) hours as needed for mild pain, fever or headache.   apixaban 5 MG Tabs tablet Commonly known as: ELIQUIS Take 10 mg by mouth 2 (two) times daily.   atorvastatin  20 MG tablet Commonly known as: LIPITOR Take 20 mg by mouth daily.   Azelastine  HCl 137 MCG/SPRAY Soln Place 1 spray into both nostrils 2 (two) times daily.   clorazepate  7.5 MG tablet Commonly known as: TRANXENE  Take 1 tablet (7.5 mg total) by mouth 2 (two) times daily as needed for anxiety (sleep).   clotrimazole -betamethasone  cream Commonly known as: LOTRISONE  Apply 1 application. topically daily.   finasteride  5 MG tablet Commonly known as: PROSCAR  Take 1  tablet (5 mg total) by mouth daily.   fluticasone  50 MCG/ACT nasal spray Commonly known as: FLONASE  Place 2 sprays into both nostrils daily.   folic acid  1 MG tablet Commonly known as: FOLVITE  Take 2 mg by mouth daily.   gabapentin  100 MG capsule Commonly known as: NEURONTIN  Take 1 capsule (100 mg total) by mouth 2 (two) times daily.   levothyroxine  100 MCG tablet Commonly known as: SYNTHROID  Take 100 mcg by mouth daily.   methotrexate  2.5 MG tablet Commonly known as: RHEUMATREX Take 12.5 mg by mouth once a week.   multivitamin with minerals Tabs  tablet Take 1 tablet by mouth daily.   naphazoline-glycerin  Soln Commonly known as: CLEAR EYES REDNESS Place 1-2 drops into both eyes 4 (four) times daily as needed for eye irritation.   pantoprazole  40 MG tablet Commonly known as: PROTONIX  Take 40 mg by mouth every morning.   senna-docusate 8.6-50 MG tablet Commonly known as: Senokot-S Take 2 tablets by mouth 2 (two) times daily.        Allergies: Allergies[1]  Family History: Family History  Problem Relation Age of Onset   Hypertension Mother    Parkinson's disease Father    Stomach cancer Father     Social History:  reports that he has quit smoking. His smoking use included cigarettes. He has never been exposed to tobacco smoke. He has never used smokeless tobacco. He reports that he does not currently use alcohol. He reports that he does not use drugs.  ROS: Pertinent ROS in HPI  Physical Exam: There were no vitals taken for this visit.  Constitutional:  Well nourished. Alert and oriented, No acute distress. HEENT: [place] AT, moist mucus membranes.  Trachea midline, no masses. Cardiovascular: No clubbing, cyanosis, or edema. Respiratory: Normal respiratory effort, no increased work of breathing. GI: Abdomen is soft, non tender, non distended, no abdominal masses. Liver and spleen not palpable.  No hernias appreciated.  Stool sample for occult testing is not indicated.   GU: No CVA tenderness.  No bladder fullness or masses.  Patient with circumcised/uncircumcised phallus. ***Foreskin easily retracted***  Urethral meatus is patent.  No penile discharge. No penile lesions or rashes. Scrotum without lesions, cysts, rashes and/or edema.  Testicles are located scrotally bilaterally. No masses are appreciated in the testicles. Left and right epididymis are normal. Rectal: Patient with  normal sphincter tone. Anus and perineum without scarring or rashes. No rectal masses are appreciated. Prostate is approximately *** grams, ***  nodules are appreciated. Seminal vesicles are normal. Skin: No rashes, bruises or suspicious lesions. Lymph: No cervical or inguinal adenopathy. Neurologic: Grossly intact, no focal deficits, moving all 4 extremities. Psychiatric: Normal mood and affect.  Laboratory Data: See Epic and HPI   I have reviewed the labs.   Pertinent Imaging: ***  Assessment & Plan:  ***  1. 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  These notes generated with voice recognition software. I apologize for typographical errors.  SHANNON MCGOWAN,  PA-C  [facility] [address]  [address] [place], [place] [address] [phone]     [1]  Allergies Allergen Reactions   Doxazosin  Other (See Comments)    Experienced a significant drop in blood pressure after receiving doxazosin .   Tetracycline Swelling    Other Reaction(s): Not available   Tetracyclines & Related Anaphylaxis   Amoxicillin-Pot Clavulanate     Other Reaction(s): Not available   Tamsulosin     Other reaction(s): Headache  Left sided headache  Other Reaction(s): Headache   Silicone Rash   "

## 2024-01-19 ENCOUNTER — Ambulatory Visit: Payer: Self-pay

## 2024-01-19 VITALS — BP 131/65 | HR 60 | Wt 153.0 lb

## 2024-01-19 DIAGNOSIS — N401 Enlarged prostate with lower urinary tract symptoms: Secondary | ICD-10-CM | POA: Diagnosis not present

## 2024-01-19 DIAGNOSIS — N138 Other obstructive and reflux uropathy: Secondary | ICD-10-CM | POA: Diagnosis not present

## 2024-01-19 LAB — BLADDER SCAN AMB NON-IMAGING

## 2024-01-19 MED ORDER — FINASTERIDE 5 MG PO TABS: 5.0000 mg | ORAL_TABLET | Freq: Every day | ORAL | 3 refills | Status: AC

## 2024-01-21 IMAGING — CT CT CERVICAL SPINE W/O CM
3 of 4 series · 14 of 33 positions shown, 17 images · non-contrast
Comparison: None Available.

CLINICAL DATA: Moderate to severe head trauma



[Series 3: sagittal bone · sagittal · 0.38mm/px · 5 of 72 slices shown, 6 images]
[im 24/72  bone]
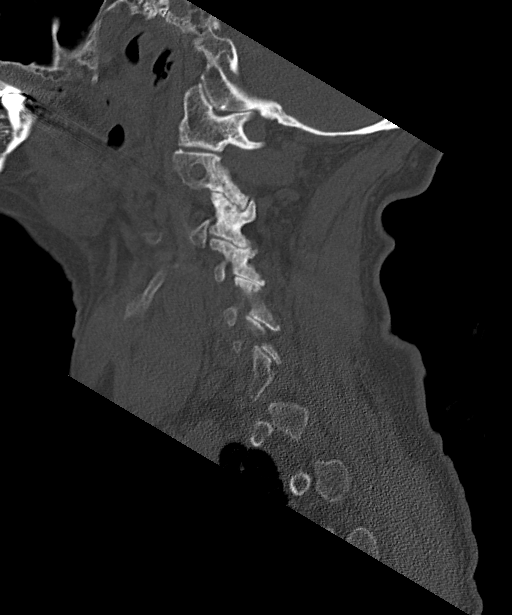
[im 30/72  bone]
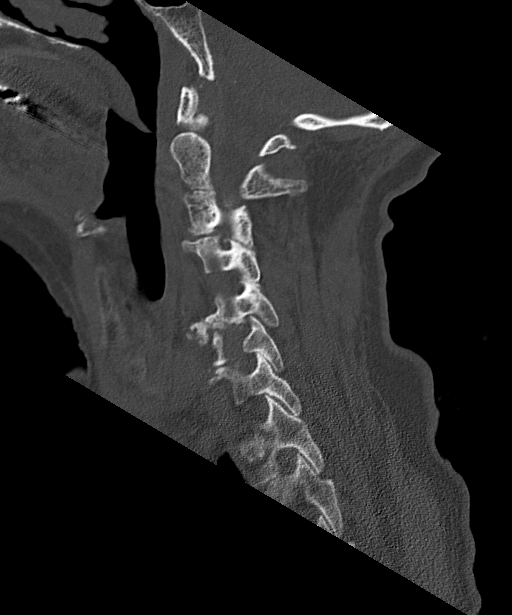
[im 36/72  soft-tissue]
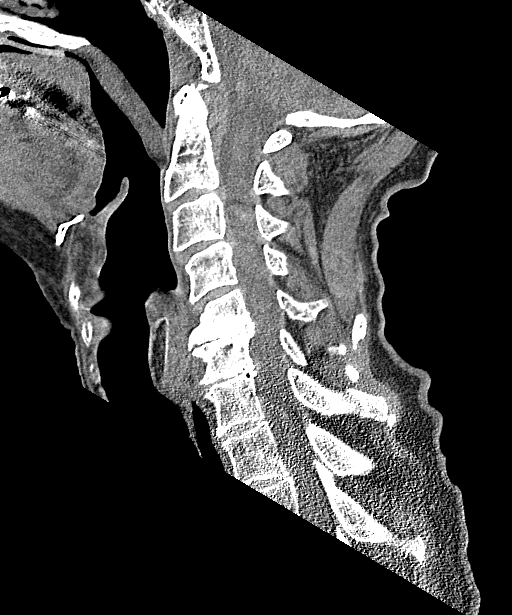
[im 36/72  bone]
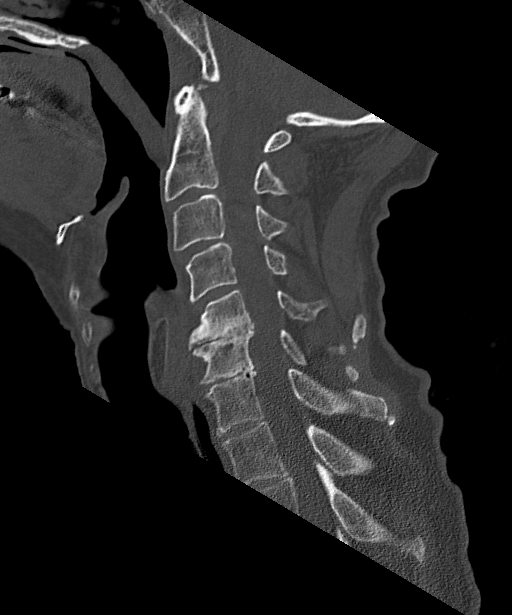
[im 42/72  bone]
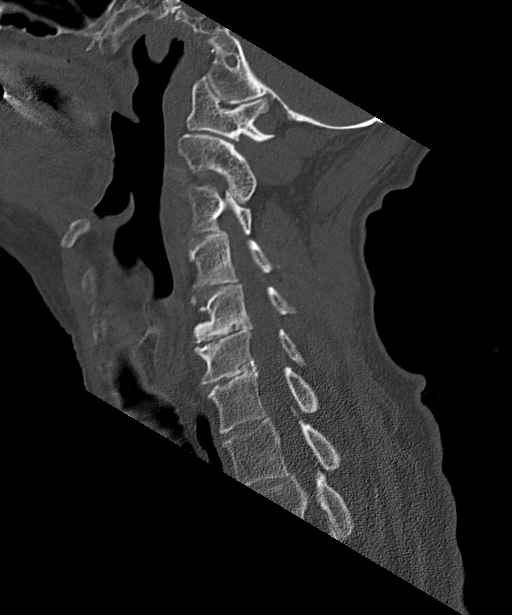
[im 48/72  bone]
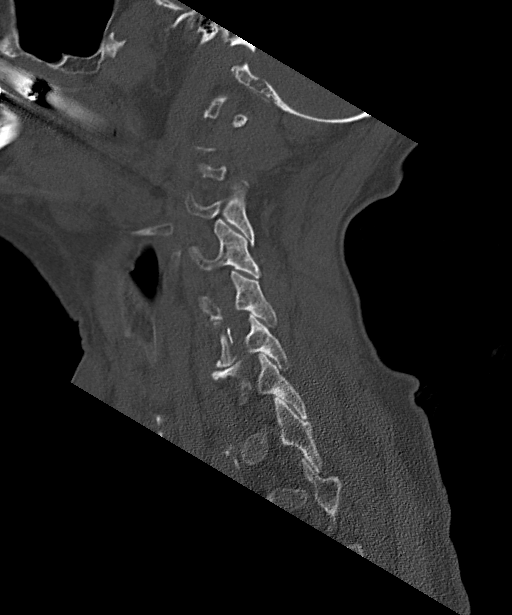

[Series 4: coronal bone · coronal · 0.33mm/px · 3 of 97 slices shown]
[im 34/97  bone]
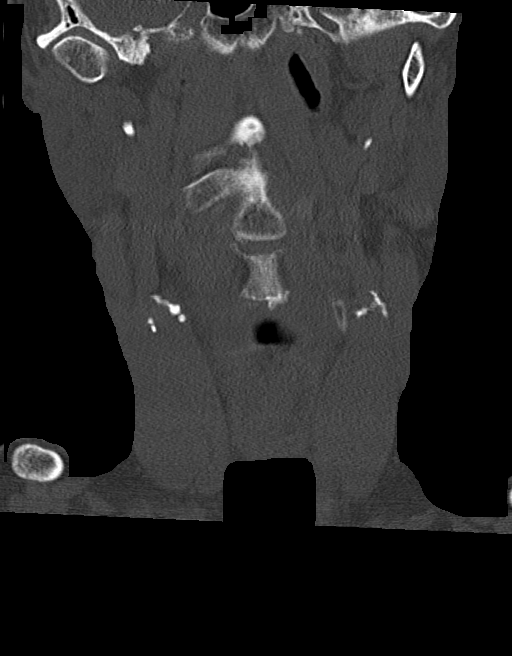
[im 44/97  bone]
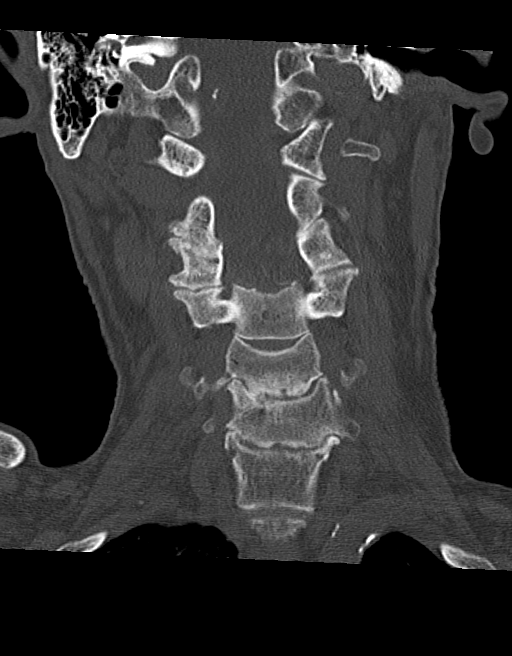
[im 53/97  bone]
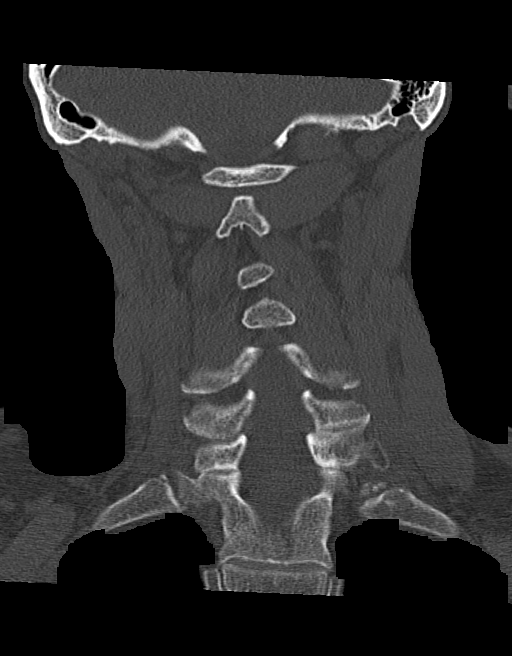

[Series 6: orthogonal bone · axial · 0.31mm/px · z∈[-285,-136]mm · 6 of 117 slices shown, 8 images]
[im 15/117  soft-tissue]
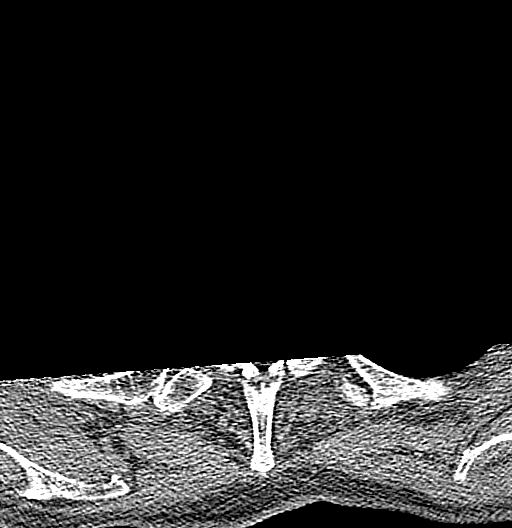
[im 15/117  bone]
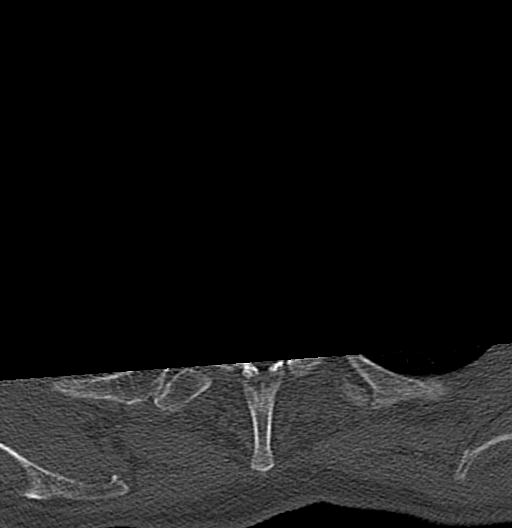
[im 30/117  bone]
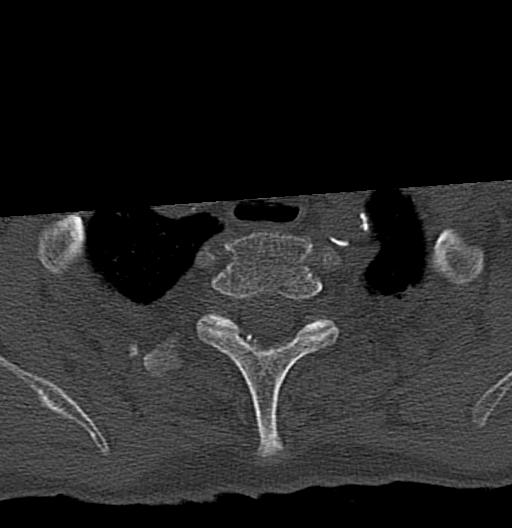
[im 44/117  bone]
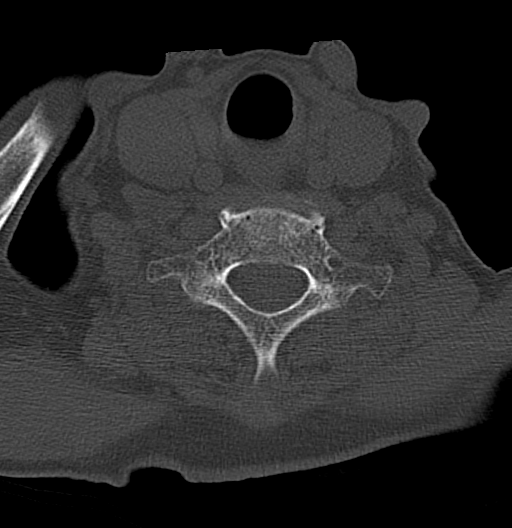
[im 73/117  bone]
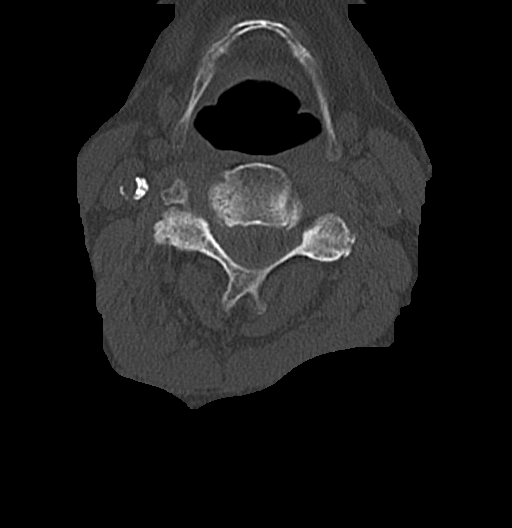
[im 88/117  soft-tissue]
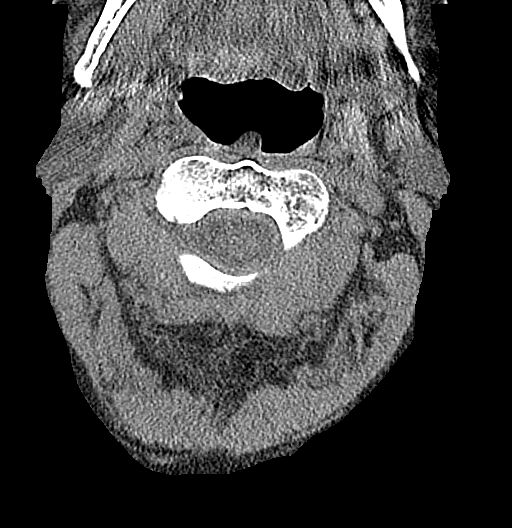
[im 88/117  bone]
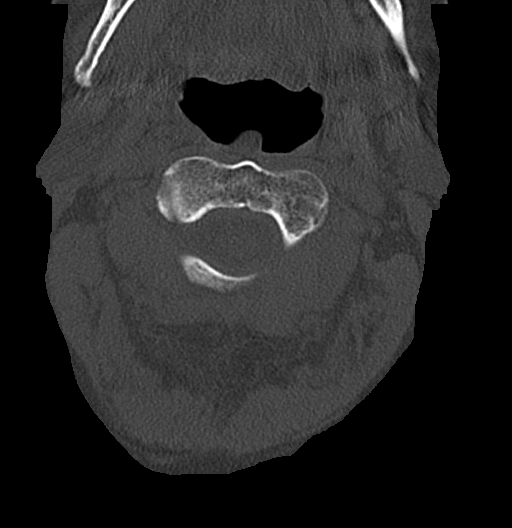
[im 102/117  bone]
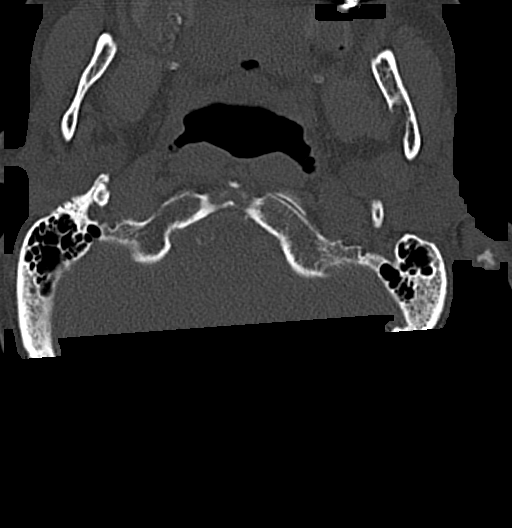

[14 of 33 positions shown; findings below may reference images not displayed]

FINDINGS: CT HEAD FINDINGS

Brain: No acute intracranial hemorrhage. No focal mass lesion. No CT
evidence of acute infarction. No midline shift or mass effect. No
hydrocephalus. Basilar cisterns are patent.

There are periventricular and subcortical white matter
hypodensities. Generalized cortical atrophy.

Vascular: No hyperdense vessel or unexpected calcification.

Skull: Normal. Negative for fracture or focal lesion.

Sinuses/Orbits: Paranasal sinuses and mastoid air cells are clear.
Orbits are clear.

Other: None.

CT CERVICAL SPINE FINDINGS

Alignment: Normal alignment of the cervical vertebral bodies.

Skull base and vertebrae: Normal craniocervical junction. No loss of
vertebral body height or disc height. Normal facet articulation. No
evidence of fracture.

Soft tissues and spinal canal: No prevertebral soft tissue swelling.
No perispinal or epidural hematoma.

Disc levels: Endplates spurring and joint space narrowing at C5-C6
and C6-C7. No acute findings.

Upper chest: Clear

Other: Nuchal calcifications posterior to C6.
IMPRESSION: 1. No acute intracranial findings.
2. No cervical spine fracture.
3. Multilevel disc osteophytic disease.

## 2024-01-22 IMAGING — DX DG PORTABLE PELVIS
1 series · 1 of 1 positions shown · non-contrast
Comparison: None Available.

CLINICAL DATA: [id number].  Post op pelvis, right hip fx

EXAM:
PORTABLE PELVIS 1-2 VIEWS

[pelvis ap]
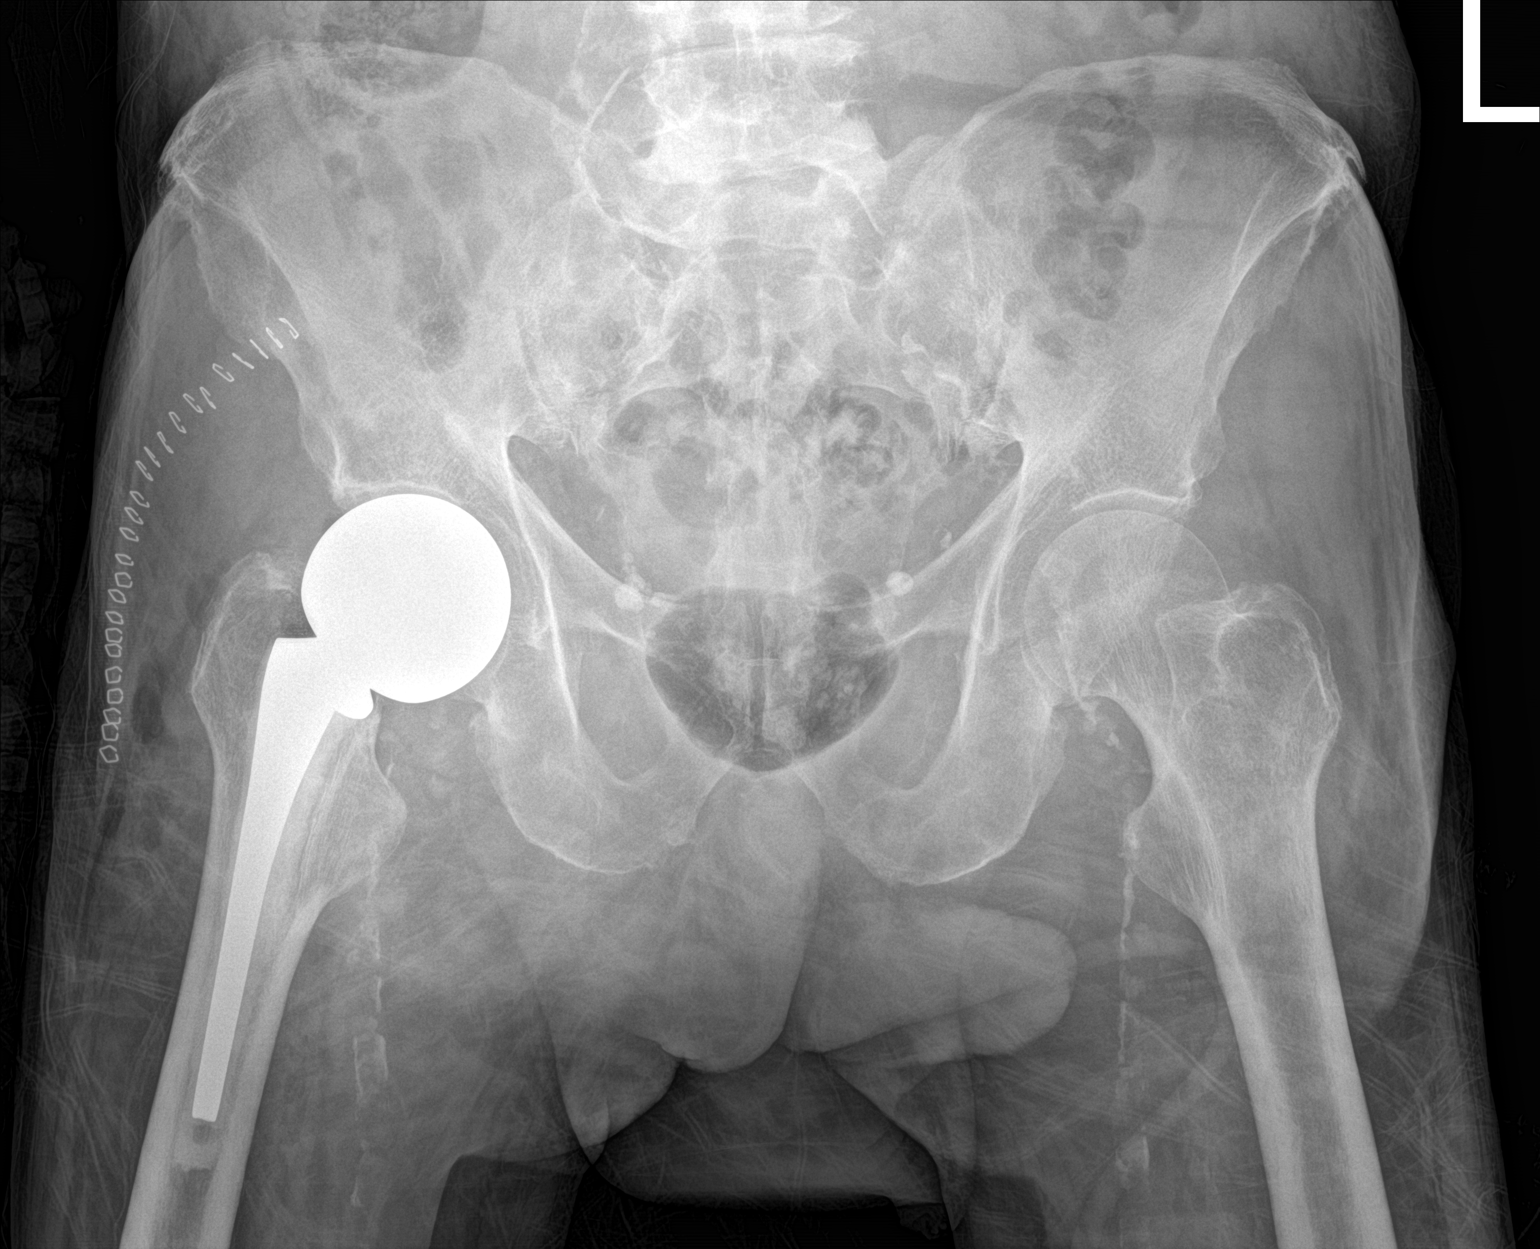

[1 of 1 positions shown; findings below may reference images not displayed]

FINDINGS: Status post total right hip arthroplasty. Frontal view left hip
grossly unremarkable. There is no evidence of pelvic fracture or
diastasis. No pelvic bone lesions are seen. Skin staples overlie the
right hip. Subcutaneus soft tissue edema and emphysema of the right
hip consistent with postsurgical changes. Vascular calcifications.
IMPRESSION: Status post total right hip arthroplasty.

## 2025-01-18 ENCOUNTER — Ambulatory Visit
